# Patient Record
Sex: Male | Born: 1955
Health system: Southern US, Community
[De-identification: ages and names within clinical notes are randomized; demographics above are authoritative.]

## PROBLEM LIST (undated history)

## (undated) DIAGNOSIS — K635 Polyp of colon: Secondary | ICD-10-CM

## (undated) DIAGNOSIS — F32A Depression, unspecified: Secondary | ICD-10-CM

## (undated) DIAGNOSIS — K219 Gastro-esophageal reflux disease without esophagitis: Secondary | ICD-10-CM

## (undated) DIAGNOSIS — H269 Unspecified cataract: Secondary | ICD-10-CM

## (undated) DIAGNOSIS — N189 Chronic kidney disease, unspecified: Secondary | ICD-10-CM

## (undated) DIAGNOSIS — F329 Major depressive disorder, single episode, unspecified: Secondary | ICD-10-CM

## (undated) DIAGNOSIS — T7840XA Allergy, unspecified, initial encounter: Secondary | ICD-10-CM

## (undated) DIAGNOSIS — M1990 Unspecified osteoarthritis, unspecified site: Secondary | ICD-10-CM

## (undated) DIAGNOSIS — C801 Malignant (primary) neoplasm, unspecified: Secondary | ICD-10-CM

## (undated) DIAGNOSIS — I493 Ventricular premature depolarization: Secondary | ICD-10-CM

## (undated) DIAGNOSIS — E785 Hyperlipidemia, unspecified: Secondary | ICD-10-CM

## (undated) DIAGNOSIS — F419 Anxiety disorder, unspecified: Secondary | ICD-10-CM

## (undated) HISTORY — DX: Unspecified cataract: H26.9

## (undated) HISTORY — PX: JOINT REPLACEMENT: SHX530

## (undated) HISTORY — DX: Allergy, unspecified, initial encounter: T78.40XA

## (undated) HISTORY — DX: Chronic kidney disease, unspecified: N18.9

## (undated) HISTORY — DX: Malignant (primary) neoplasm, unspecified: C80.1

## (undated) HISTORY — PX: EYE SURGERY: SHX253

## (undated) HISTORY — DX: Gastro-esophageal reflux disease without esophagitis: K21.9

## (undated) HISTORY — DX: Major depressive disorder, single episode, unspecified: F32.9

## (undated) HISTORY — PX: OTHER SURGICAL HISTORY: SHX169

## (undated) HISTORY — DX: Polyp of colon: K63.5

## (undated) HISTORY — DX: Unspecified osteoarthritis, unspecified site: M19.90

## (undated) HISTORY — DX: Depression, unspecified: F32.A

## (undated) HISTORY — DX: Ventricular premature depolarization: I49.3

## (undated) HISTORY — DX: Hyperlipidemia, unspecified: E78.5

## (undated) HISTORY — PX: POLYPECTOMY: SHX149

## (undated) HISTORY — DX: Anxiety disorder, unspecified: F41.9

## (undated) HISTORY — PX: CATARACT EXTRACTION: SUR2

---

## 2001-12-29 DIAGNOSIS — C801 Malignant (primary) neoplasm, unspecified: Secondary | ICD-10-CM

## 2001-12-29 HISTORY — DX: Malignant (primary) neoplasm, unspecified: C80.1

## 2001-12-29 HISTORY — PX: NEPHRECTOMY: SHX65

## 2006-12-29 LAB — HM COLONOSCOPY

## 2007-01-13 ENCOUNTER — Ambulatory Visit: Payer: Self-pay | Admitting: Gastroenterology

## 2008-11-02 ENCOUNTER — Emergency Department: Payer: Self-pay | Admitting: Emergency Medicine

## 2010-12-29 HISTORY — PX: RETINAL DETACHMENT SURGERY: SHX105

## 2011-06-17 ENCOUNTER — Telehealth: Payer: Self-pay | Admitting: Internal Medicine

## 2011-06-23 ENCOUNTER — Ambulatory Visit: Payer: Self-pay | Admitting: Internal Medicine

## 2011-07-01 ENCOUNTER — Ambulatory Visit (INDEPENDENT_AMBULATORY_CARE_PROVIDER_SITE_OTHER): Payer: Commercial Managed Care - PPO | Admitting: Internal Medicine

## 2011-07-01 ENCOUNTER — Encounter: Payer: Self-pay | Admitting: Internal Medicine

## 2011-07-01 VITALS — BP 142/90 | HR 80 | Temp 98.0°F | Resp 16 | Ht 67.0 in | Wt 265.0 lb

## 2011-07-01 DIAGNOSIS — Z0000 Encounter for general adult medical examination without abnormal findings: Secondary | ICD-10-CM

## 2011-07-01 MED ORDER — ALPRAZOLAM 0.5 MG PO TABS: 0.5000 mg | ORAL_TABLET | Freq: Every day | ORAL | Status: DC | PRN

## 2011-07-01 NOTE — Patient Instructions (Signed)
Limit your sodium (Salt) intake    It is important that you exercise regularly, at least 20 minutes 3 to 4 times per week.  If you develop chest pain or shortness of breath seek  medical attention.  You need to lose weight.  Consider a lower calorie diet and regular exercise.  Avoids foods high in acid such as tomatoes citrus juices, and spicy foods.  Avoid eating within two hours of lying down or before exercising.  Do not overheat.  Try smaller more frequent meals.  If symptoms persist, elevate the head of her bed 12 inches while sleeping.  Return in 6 months for follow-up  Please check your blood pressure on a regular basis.  If it is consistently greater than 150/90, please make an office appointment.  

## 2011-07-01 NOTE — Progress Notes (Signed)
Subjective:    Patient ID: Alan Tyler, male    DOB: 01/02/1956, 55 y.o.   MRN: [id number]  HPI   A 55 year old patient who is seen today to establish with our practice. He is a former Engineer, production medicine patient. In 2003 he was diagnosed with right to the renal cancer and is status post a right nephrectomy. He discontinued tobacco use at that time. He has been followed this year by Dr. Luciana Axe it to a detached retina involving the right eye. He also has a cataract involving the right eye and is contemplating surgery. He has had some borderline blood pressure and lipid readings in the past. He has remote history of depression that has been stable off medication he has gastroesophageal reflux disease well controlled on OTC omeprazole. He has renal stone disease and also had a history of colonic polyps. Surgical procedures have included a right nephrectomy in 2003 and repair of a detached retina  Family history father died at age 83 of an MI. First MI at age 11. Mother age 37 with hypothyroidism. He is the oldest of 5 siblings Social history. 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  HENT: Negative for hearing loss, ear pain, congestion, rhinorrhea, sneezing, mouth sores, trouble swallowing, neck pain, neck stiffness, dental problem, voice change, sinus pressure and tinnitus.   Eyes: Negative for photophobia, pain, redness and visual disturbance.  Respiratory: Negative for apnea, cough, choking, chest tightness, shortness of breath and wheezing.   Cardiovascular: Negative for chest pain, palpitations and leg swelling.  Gastrointestinal: Negative for nausea, vomiting, abdominal pain, diarrhea, constipation, blood in stool, abdominal distention,  anal bleeding and rectal pain.  Genitourinary: Negative for dysuria, urgency, frequency, hematuria, flank pain, decreased urine volume, discharge, penile swelling, scrotal swelling, difficulty urinating, genital sores and testicular pain.  Musculoskeletal: Negative for myalgias, back pain, joint swelling, arthralgias and gait problem.  Skin: Negative for color change, rash and wound.  Neurological: Negative for dizziness, tremors, seizures, syncope, facial asymmetry, speech difficulty, weakness, light-headedness, numbness and headaches.  Hematological: Negative for adenopathy. Does not bruise/bleed easily.  Psychiatric/Behavioral: Negative for suicidal ideas, hallucinations, behavioral problems, confusion, sleep disturbance, self-injury, dysphoric mood, decreased concentration and agitation. The patient is not nervous/anxious.        Objective:   Physical Exam  Constitutional: He is oriented to person, place, and time. He appears well-developed.       Weight 265 Blood pressure 140 over 90  HENT:  Head: Normocephalic.  Right Ear: External ear normal.  Left Ear: External ear normal.       Pharyngeal crowding  Eyes: Conjunctivae and EOM are normal.  Neck: Normal range of motion.  Cardiovascular: Normal rate and normal heart sounds.   Pulmonary/Chest: Breath sounds normal.  Abdominal: Bowel sounds are normal.  Musculoskeletal: Normal range of motion. He exhibits no edema and no tenderness.  Neurological: He is alert and oriented to person, place, and time.  Skin:       Subcutaneous mass left supraclavicular area which has been chronic and thought to be a sebaceous cyst. This has been evaluated by dermatology  Psychiatric: He has a normal mood and affect. His behavior is normal.          Assessment & Plan:   Exogenous obesity  Borderline hypertension History of mild dyslipidemia Rule out obstructive sleep apnea Colonic polyps History of right  nephrectomy Nephrolithiasis Gastroesophageal reflux disease Remote history depression  Lifestyle issues discussed at length. He will return at his convenience for a fasting lab including testosterone level

## 2011-07-03 ENCOUNTER — Other Ambulatory Visit (INDEPENDENT_AMBULATORY_CARE_PROVIDER_SITE_OTHER): Payer: Commercial Managed Care - PPO

## 2011-07-03 DIAGNOSIS — Z0000 Encounter for general adult medical examination without abnormal findings: Secondary | ICD-10-CM

## 2011-07-03 LAB — HEPATIC FUNCTION PANEL
ALT: 42 U/L (ref 0–53)
AST: 27 U/L (ref 0–37)
Albumin: 4.1 g/dL (ref 3.5–5.2)
Alkaline Phosphatase: 79 U/L (ref 39–117)
Bilirubin, Direct: 0.1 mg/dL (ref 0.0–0.3)
Total Bilirubin: 0.7 mg/dL (ref 0.3–1.2)
Total Protein: 6.9 g/dL (ref 6.0–8.3)

## 2011-07-03 LAB — POCT URINALYSIS DIPSTICK
Bilirubin, UA: NEGATIVE
Blood, UA: NEGATIVE
Glucose, UA: NEGATIVE
Ketones, UA: NEGATIVE
Leukocytes, UA: NEGATIVE
Nitrite, UA: NEGATIVE
Protein, UA: NEGATIVE
Spec Grav, UA: 1.01
Urobilinogen, UA: 0.2
pH, UA: 5.5

## 2011-07-03 LAB — CBC WITH DIFFERENTIAL/PLATELET
Basophils Absolute: 0 10*3/uL (ref 0.0–0.1)
Basophils Relative: 0.4 % (ref 0.0–3.0)
Eosinophils Absolute: 0.2 10*3/uL (ref 0.0–0.7)
Eosinophils Relative: 2.7 % (ref 0.0–5.0)
HCT: 44.2 % (ref 39.0–52.0)
Hemoglobin: 15.5 g/dL (ref 13.0–17.0)
Lymphocytes Relative: 26.7 % (ref 12.0–46.0)
Lymphs Abs: 1.9 10*3/uL (ref 0.7–4.0)
MCHC: 35.2 g/dL (ref 30.0–36.0)
MCV: 90.6 fl (ref 78.0–100.0)
Monocytes Absolute: 0.5 10*3/uL (ref 0.1–1.0)
Monocytes Relative: 7.5 % (ref 3.0–12.0)
Neutro Abs: 4.5 10*3/uL (ref 1.4–7.7)
Neutrophils Relative %: 62.7 % (ref 43.0–77.0)
Platelets: 190 10*3/uL (ref 150.0–400.0)
RBC: 4.88 Mil/uL (ref 4.22–5.81)
RDW: 14.2 % (ref 11.5–14.6)
WBC: 7.1 10*3/uL (ref 4.5–10.5)

## 2011-07-03 LAB — LIPID PANEL
Cholesterol: 209 mg/dL — ABNORMAL HIGH (ref 0–200)
HDL: 34.9 mg/dL — ABNORMAL LOW (ref >39.00)
Total CHOL/HDL Ratio: 6
Triglycerides: 215 mg/dL — ABNORMAL HIGH (ref 0.0–149.0)
VLDL: 43 mg/dL — ABNORMAL HIGH (ref 0.0–40.0)

## 2011-07-03 LAB — BASIC METABOLIC PANEL
BUN: 13 mg/dL (ref 6–23)
CO2: 25 mEq/L (ref 19–32)
Calcium: 8.7 mg/dL (ref 8.4–10.5)
Chloride: 105 mEq/L (ref 96–112)
Creatinine, Ser: 1.3 mg/dL (ref 0.4–1.5)
GFR: 63.28 mL/min (ref >60.00)
Glucose, Bld: 117 mg/dL — ABNORMAL HIGH (ref 70–99)
Potassium: 4.7 mEq/L (ref 3.5–5.1)
Sodium: 136 mEq/L (ref 135–145)

## 2011-07-03 LAB — TSH: TSH: 2.34 u[IU]/mL (ref 0.35–5.50)

## 2011-07-03 LAB — LDL CHOLESTEROL, DIRECT: Direct LDL: 162.6 mg/dL

## 2011-07-03 LAB — TESTOSTERONE: Testosterone: 465.86 ng/dL (ref 350.00–890.00)

## 2011-07-03 LAB — PSA: PSA: 0.97 ng/mL (ref 0.10–4.00)

## 2011-07-08 ENCOUNTER — Telehealth: Payer: Self-pay | Admitting: Internal Medicine

## 2011-07-08 NOTE — Telephone Encounter (Signed)
Please advise 

## 2011-07-08 NOTE — Telephone Encounter (Signed)
Spoke with pt - informed of lab

## 2011-07-08 NOTE — Telephone Encounter (Signed)
Requesting lab results from last Thursday.

## 2011-07-08 NOTE — Telephone Encounter (Signed)
All okay. Cholesterol 209;  testosterone level normal

## 2011-08-19 NOTE — Telephone Encounter (Signed)
In error

## 2011-11-10 ENCOUNTER — Telehealth: Payer: Self-pay | Admitting: *Deleted

## 2011-11-10 MED ORDER — BUPROPION HCL ER (XL) 150 MG PO TB24: 150.0000 mg | ORAL_TABLET | ORAL | Status: DC

## 2011-11-10 NOTE — Telephone Encounter (Signed)
Wellbutrin extended release 150 mg #90 one daily. Suggest office visit in 6 weeks

## 2011-11-10 NOTE — Telephone Encounter (Signed)
Attempt to call - VM- LMTCB if questions - ok for new rx - sent to [facility] - need to call and make 6wk rov

## 2011-11-10 NOTE — Telephone Encounter (Signed)
Stress and depression and Dr. Kirtland Bouchard gave him Wellbutrin a few year ago.  Would like to try it again.  Is also taking Xanax.

## 2011-12-08 ENCOUNTER — Ambulatory Visit (INDEPENDENT_AMBULATORY_CARE_PROVIDER_SITE_OTHER): Payer: Commercial Managed Care - PPO | Admitting: Internal Medicine

## 2011-12-08 ENCOUNTER — Ambulatory Visit: Payer: Commercial Managed Care - PPO | Admitting: Internal Medicine

## 2011-12-08 ENCOUNTER — Encounter: Payer: Self-pay | Admitting: Internal Medicine

## 2011-12-08 VITALS — BP 112/80 | Temp 98.4°F | Wt 260.0 lb

## 2011-12-08 DIAGNOSIS — Z23 Encounter for immunization: Secondary | ICD-10-CM

## 2011-12-08 DIAGNOSIS — Z0000 Encounter for general adult medical examination without abnormal findings: Secondary | ICD-10-CM

## 2011-12-08 MED ORDER — ALPRAZOLAM 0.5 MG PO TABS: 0.5000 mg | ORAL_TABLET | Freq: Every day | ORAL | Status: DC | PRN

## 2011-12-08 MED ORDER — BUPROPION HCL ER (XL) 150 MG PO TB24: 150.0000 mg | ORAL_TABLET | ORAL | Status: DC

## 2011-12-08 NOTE — Patient Instructions (Signed)
It is important that you exercise regularly, at least 20 minutes 3 to 4 times per week.  If you develop chest pain or shortness of breath seek  medical attention.  Limit your sodium (Salt) intake  Return in 6 months for follow-up  

## 2011-12-08 NOTE — Progress Notes (Signed)
  Subjective:    Patient ID: Alan Tyler, male    DOB: 06-02-1956, 55 y.o.   MRN: [id number]  HPI  55 year old patient who is seen today for followup. He was seen for his annual exam this past summer and was doing well 6 months ago. He has a history of depression and motivation and was resumed one month ago and he has had a very nice clinical response. He feels very well today he also takes alprazolam when necessary but often goes days without use. He quite pleased with his progress today    Review of Systems  Psychiatric/Behavioral: Positive for dysphoric mood. The patient is nervous/anxious.        Objective:   Physical Exam  Constitutional: He is oriented to person, place, and time. He appears well-developed and well-nourished. No distress.       Blood pressure well controlled  HENT:  Head: Normocephalic.  Right Ear: External ear normal.  Left Ear: External ear normal.  Eyes: Conjunctivae and EOM are normal.  Neck: Normal range of motion.  Cardiovascular: Normal rate and normal heart sounds.   Pulmonary/Chest: Breath sounds normal.  Abdominal: Bowel sounds are normal.  Musculoskeletal: Normal range of motion. He exhibits no edema and no tenderness.  Neurological: He is alert and oriented to person, place, and time.  Psychiatric: He has a normal mood and affect. His behavior is normal. Judgment and thought content normal.          Assessment & Plan:   Depression. Not clinical response with modest dose Wellbutrin. We'll continue present regimen Will return here when necessary or in 6 months for his annual exam

## 2012-06-07 ENCOUNTER — Ambulatory Visit (INDEPENDENT_AMBULATORY_CARE_PROVIDER_SITE_OTHER): Payer: Commercial Managed Care - PPO | Admitting: Internal Medicine

## 2012-06-07 ENCOUNTER — Encounter: Payer: Self-pay | Admitting: Internal Medicine

## 2012-06-07 VITALS — BP 134/78 | Temp 98.3°F | Wt 266.0 lb

## 2012-06-07 DIAGNOSIS — F329 Major depressive disorder, single episode, unspecified: Secondary | ICD-10-CM

## 2012-06-07 DIAGNOSIS — F32A Depression, unspecified: Secondary | ICD-10-CM

## 2012-06-07 MED ORDER — ALPRAZOLAM 0.5 MG PO TABS: 0.5000 mg | ORAL_TABLET | Freq: Every day | ORAL | Status: DC | PRN

## 2012-06-07 MED ORDER — BUPROPION HCL ER (XL) 150 MG PO TB24: 150.0000 mg | ORAL_TABLET | ORAL | Status: DC

## 2012-06-07 NOTE — Progress Notes (Signed)
  Subjective:    Patient ID: Alan Tyler, male    DOB: Nov 17, 1956, 56 y.o.   MRN: [id number]  HPI  Wt Readings from Last 3 Encounters:  06/07/12 266 lb (120.657 kg)  12/08/11 260 lb (117.935 kg)  07/01/11 265 lb (120.203 kg)    Review of Systems     Objective:   Physical Exam        Assessment & Plan:

## 2012-06-07 NOTE — Progress Notes (Signed)
  Subjective:    Patient ID: Alan Tyler, male    DOB: 1956-05-18, 56 y.o.   MRN: [id number]  HPI  56 year old patient who is seen today for followup. He has been on low-dose Wellbutrin for anxiety depression and has done quite well on this dose. He also uses alprazolam 2 or 3 times per week and feels that he has done quite well. He is quite pleased with his progress. He still is out of work and has some other stressors but seems to be managing quite well    Review of Systems  Constitutional: Negative for fever, chills, appetite change and fatigue.  HENT: Negative for hearing loss, ear pain, congestion, sore throat, trouble swallowing, neck stiffness, dental problem, voice change and tinnitus.   Eyes: Negative for pain, discharge and visual disturbance.  Respiratory: Negative for cough, chest tightness, wheezing and stridor.   Cardiovascular: Negative for chest pain, palpitations and leg swelling.  Gastrointestinal: Negative for nausea, vomiting, abdominal pain, diarrhea, constipation, blood in stool and abdominal distention.  Genitourinary: Negative for urgency, hematuria, flank pain, discharge, difficulty urinating and genital sores.  Musculoskeletal: Negative for myalgias, back pain, joint swelling, arthralgias and gait problem.  Skin: Negative for rash.  Neurological: Negative for dizziness, syncope, speech difficulty, weakness, numbness and headaches.  Hematological: Negative for adenopathy. Does not bruise/bleed easily.  Psychiatric/Behavioral: Positive for dysphoric mood. Negative for behavioral problems. The patient is nervous/anxious.        Objective:   Physical Exam  Constitutional: He appears well-developed and well-nourished. No distress.       Weight 266 Blood pressure 110/76  Psychiatric: He has a normal mood and affect. His behavior is normal. Judgment and thought content normal.          Assessment & Plan:    Anxiety depression. Stable we'll continue present  regimen. Medicines refilled we'll see in 6 months for his annual exam

## 2012-06-07 NOTE — Patient Instructions (Signed)
Limit your sodium (Salt) intake    It is important that you exercise regularly, at least 20 minutes 3 to 4 times per week.  If you develop chest pain or shortness of breath seek  medical attention.  You need to lose weight.  Consider a lower calorie diet and regular exercise. 

## 2012-11-29 ENCOUNTER — Other Ambulatory Visit (INDEPENDENT_AMBULATORY_CARE_PROVIDER_SITE_OTHER): Payer: Commercial Managed Care - PPO

## 2012-11-29 DIAGNOSIS — Z0000 Encounter for general adult medical examination without abnormal findings: Secondary | ICD-10-CM

## 2012-11-29 LAB — LIPID PANEL
Cholesterol: 222 mg/dL — ABNORMAL HIGH (ref 0–200)
HDL: 32.9 mg/dL — ABNORMAL LOW (ref >39.00)
Total CHOL/HDL Ratio: 7
Triglycerides: 213 mg/dL — ABNORMAL HIGH (ref 0.0–149.0)
VLDL: 42.6 mg/dL — ABNORMAL HIGH (ref 0.0–40.0)

## 2012-11-29 LAB — HEPATIC FUNCTION PANEL
ALT: 22 U/L (ref 0–53)
AST: 20 U/L (ref 0–37)
Albumin: 3.7 g/dL (ref 3.5–5.2)
Alkaline Phosphatase: 71 U/L (ref 39–117)
Bilirubin, Direct: 0.1 mg/dL (ref 0.0–0.3)
Total Bilirubin: 0.8 mg/dL (ref 0.3–1.2)
Total Protein: 7 g/dL (ref 6.0–8.3)

## 2012-11-29 LAB — CBC WITH DIFFERENTIAL/PLATELET
Basophils Absolute: 0 10*3/uL (ref 0.0–0.1)
Basophils Relative: 0.6 % (ref 0.0–3.0)
Eosinophils Absolute: 0.2 10*3/uL (ref 0.0–0.7)
Eosinophils Relative: 2.8 % (ref 0.0–5.0)
HCT: 42 % (ref 39.0–52.0)
Hemoglobin: 13.9 g/dL (ref 13.0–17.0)
Lymphocytes Relative: 23.6 % (ref 12.0–46.0)
Lymphs Abs: 1.7 10*3/uL (ref 0.7–4.0)
MCHC: 33 g/dL (ref 30.0–36.0)
MCV: 88.7 fl (ref 78.0–100.0)
Monocytes Absolute: 0.5 10*3/uL (ref 0.1–1.0)
Monocytes Relative: 6.2 % (ref 3.0–12.0)
Neutro Abs: 4.9 10*3/uL (ref 1.4–7.7)
Neutrophils Relative %: 66.8 % (ref 43.0–77.0)
Platelets: 219 10*3/uL (ref 150.0–400.0)
RBC: 4.73 Mil/uL (ref 4.22–5.81)
RDW: 14.5 % (ref 11.5–14.6)
WBC: 7.4 10*3/uL (ref 4.5–10.5)

## 2012-11-29 LAB — BASIC METABOLIC PANEL
BUN: 14 mg/dL (ref 6–23)
CO2: 23 mEq/L (ref 19–32)
Calcium: 8.7 mg/dL (ref 8.4–10.5)
Chloride: 106 mEq/L (ref 96–112)
Creatinine, Ser: 1.5 mg/dL (ref 0.4–1.5)
GFR: 52.7 mL/min — ABNORMAL LOW (ref >60.00)
Glucose, Bld: 104 mg/dL — ABNORMAL HIGH (ref 70–99)
Potassium: 4.4 mEq/L (ref 3.5–5.1)
Sodium: 138 mEq/L (ref 135–145)

## 2012-11-29 LAB — POCT URINALYSIS DIPSTICK
Bilirubin, UA: NEGATIVE
Blood, UA: NEGATIVE
Glucose, UA: NEGATIVE
Ketones, UA: NEGATIVE
Leukocytes, UA: NEGATIVE
Nitrite, UA: NEGATIVE
Protein, UA: NEGATIVE
Spec Grav, UA: 1.015
Urobilinogen, UA: 0.2
pH, UA: 5.5

## 2012-11-29 LAB — TSH: TSH: 5.38 u[IU]/mL (ref 0.35–5.50)

## 2012-11-29 LAB — LDL CHOLESTEROL, DIRECT: Direct LDL: 163.2 mg/dL

## 2012-11-29 LAB — PSA: PSA: 1.43 ng/mL (ref 0.10–4.00)

## 2012-12-07 ENCOUNTER — Ambulatory Visit (INDEPENDENT_AMBULATORY_CARE_PROVIDER_SITE_OTHER): Payer: Commercial Managed Care - PPO | Admitting: Internal Medicine

## 2012-12-07 ENCOUNTER — Encounter: Payer: Self-pay | Admitting: Internal Medicine

## 2012-12-07 VITALS — BP 122/80 | HR 106 | Temp 98.1°F | Resp 20 | Ht 67.0 in | Wt 260.0 lb

## 2012-12-07 DIAGNOSIS — Z23 Encounter for immunization: Secondary | ICD-10-CM

## 2012-12-07 DIAGNOSIS — Z0000 Encounter for general adult medical examination without abnormal findings: Secondary | ICD-10-CM

## 2012-12-07 DIAGNOSIS — F329 Major depressive disorder, single episode, unspecified: Secondary | ICD-10-CM

## 2012-12-07 DIAGNOSIS — Z86010 Personal history of colonic polyps: Secondary | ICD-10-CM

## 2012-12-07 DIAGNOSIS — F32A Depression, unspecified: Secondary | ICD-10-CM

## 2012-12-07 MED ORDER — ALPRAZOLAM 0.5 MG PO TABS: 0.5000 mg | ORAL_TABLET | Freq: Every day | ORAL | Status: DC | PRN

## 2012-12-07 MED ORDER — OMEPRAZOLE 20 MG PO CPDR: 20.0000 mg | DELAYED_RELEASE_CAPSULE | Freq: Every day | ORAL | Status: DC

## 2012-12-07 MED ORDER — BUPROPION HCL ER (XL) 150 MG PO TB24: 150.0000 mg | ORAL_TABLET | ORAL | Status: DC

## 2012-12-07 NOTE — Patient Instructions (Addendum)
Schedule your colonoscopy to help detect colon cancer.    It is important that you exercise regularly, at least 20 minutes 3 to 4 times per week.  If you develop chest pain or shortness of breath seek  medical attention.  You need to lose weight.  Consider a lower calorie diet and regular exercise.

## 2012-12-07 NOTE — Progress Notes (Signed)
Patient ID: Alan Tyler, male   DOB: [birth year], 56 y.o.   MRN: [id number]  Subjective:    Patient ID: Alan Tyler, male    DOB: [birth year], 56 y.o.   MRN: [id number]  HPI   56 year-old patient who is seen today for an annual exam. He is a former Engineer, production medicine patient. In 2003 he was diagnosed with right  renal cancer and is status post a right nephrectomy. He discontinued tobacco use at that time. He has been followed this year by Dr. Luciana Axe it to a detached retina involving the right eye. He also has a cataract involving the right eye and is contemplating surgery. He has had some borderline blood pressure and lipid readings in the past. He has remote history of depression that has been stable off medication he has gastroesophageal reflux disease well controlled on OTC omeprazole. He has renal stone disease and also had a history of colonic polyps. Surgical procedures have included a right nephrectomy in 2003 and repair of a detached retina  Family history father died at age 12 of an MI. First MI at age 90. Mother age 55 with hypothyroidism. He is the oldest of 5 siblings Social history. 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  HENT: Negative for hearing loss, ear pain, congestion, rhinorrhea, sneezing, mouth sores, trouble swallowing, neck pain, neck stiffness, dental problem, voice change, sinus pressure and tinnitus.   Eyes: Negative for photophobia, pain, redness and visual disturbance.  Respiratory: Negative for apnea, cough, choking, chest tightness, shortness of breath and wheezing.   Cardiovascular: Negative for chest pain, palpitations and leg swelling.  Gastrointestinal: Negative for nausea, vomiting, abdominal pain, diarrhea,  constipation, blood in stool, abdominal distention, anal bleeding and rectal pain.  Genitourinary: Negative for dysuria, urgency, frequency, hematuria, flank pain, decreased urine volume, discharge, penile swelling, scrotal swelling, difficulty urinating, genital sores and testicular pain.  Musculoskeletal: Negative for myalgias, back pain, joint swelling, arthralgias and gait problem.  Skin: Negative for color change, rash and wound.  Neurological: Negative for dizziness, tremors, seizures, syncope, facial asymmetry, speech difficulty, weakness, light-headedness, numbness and headaches.  Hematological: Negative for adenopathy. Does not bruise/bleed easily.  Psychiatric/Behavioral: Negative for suicidal ideas, hallucinations, behavioral problems, confusion, sleep disturbance, self-injury, dysphoric mood, decreased concentration and agitation. The patient is not nervous/anxious.        Objective:   Physical Exam  Constitutional: He is oriented to person, place, and time. He appears well-developed.       Weight 265 Blood pressure 140 over 90  HENT:  Head: Normocephalic.  Right Ear: External ear normal.  Left Ear: External ear normal.       Pharyngeal crowding  Eyes: Conjunctivae normal and EOM are normal.  Neck: Normal range of motion.  Cardiovascular: Normal rate and normal heart sounds.        Decreased left posterior tibial pulse  Pulmonary/Chest: Breath sounds normal.  Abdominal: Bowel sounds are normal.  Musculoskeletal: Normal range of motion. He exhibits no edema and no tenderness.  Neurological: He is alert and oriented to person, place, and time.  Skin:       Subcutaneous mass left supraclavicular area which has been chronic and thought to be a sebaceous cyst. This has been evaluated by dermatology  Psychiatric: He  has a normal mood and affect. His behavior is normal.          Assessment & Plan:   Exogenous obesity Borderline hypertension History of mild  dyslipidemia Rule out obstructive sleep apnea Colonic polyps History of right nephrectomy Nephrolithiasis Gastroesophageal reflux disease Remote history depression  Needs followup colonoscopy Weight loss exercise encouraged Recheck 1 year or as needed

## 2012-12-08 ENCOUNTER — Encounter: Payer: Self-pay | Admitting: Gastroenterology

## 2012-12-17 ENCOUNTER — Other Ambulatory Visit: Payer: Self-pay | Admitting: Internal Medicine

## 2012-12-27 ENCOUNTER — Encounter: Payer: Self-pay | Admitting: Gastroenterology

## 2012-12-27 ENCOUNTER — Ambulatory Visit (AMBULATORY_SURGERY_CENTER): Payer: Commercial Managed Care - PPO | Admitting: *Deleted

## 2012-12-27 VITALS — Ht 67.0 in | Wt 262.6 lb

## 2012-12-27 DIAGNOSIS — Z1211 Encounter for screening for malignant neoplasm of colon: Secondary | ICD-10-CM

## 2012-12-27 MED ORDER — MOVIPREP 100 G PO SOLR: ORAL | Status: DC

## 2012-12-29 HISTORY — PX: COLONOSCOPY: SHX174

## 2012-12-30 ENCOUNTER — Telehealth: Payer: Self-pay | Admitting: *Deleted

## 2012-12-30 NOTE — Telephone Encounter (Signed)
Talked with pt: he will try to get colonoscopy and pathology report and bring day of procedure

## 2012-12-30 NOTE — Telephone Encounter (Signed)
Dr. Russella Dar:  Pt is scheduled for colonoscopy Friday 1/10.  Last colonoscopy 5 years ago in [place] with Dr. Servando Snare who is no longer practicing.  Pt's PCP has colonoscopy report but their computer system could not print report.  Report was read to me over the phone: Pt had 6 mm polyp in sigmoid colon.  Pathology report was not available in the PCP office.  Pt did say that he was told that he had an adenomatous polyp and should have recall in 5 years.  Is pt okay for direct colonoscopy?  Thanks, Olegario Messier

## 2012-12-30 NOTE — Telephone Encounter (Signed)
OK for direct colonoscopy. Please ask the patient to get copies of the colonoscopy report and pathology report to [place] if at all possible.

## 2013-01-03 ENCOUNTER — Encounter: Payer: Commercial Managed Care - PPO | Admitting: Gastroenterology

## 2013-01-07 ENCOUNTER — Ambulatory Visit (AMBULATORY_SURGERY_CENTER): Payer: Commercial Managed Care - PPO | Admitting: Gastroenterology

## 2013-01-07 ENCOUNTER — Encounter: Payer: Self-pay | Admitting: Gastroenterology

## 2013-01-07 VITALS — BP 136/76 | HR 78 | Temp 97.5°F | Resp 21 | Ht 67.0 in | Wt 262.0 lb

## 2013-01-07 DIAGNOSIS — D126 Benign neoplasm of colon, unspecified: Secondary | ICD-10-CM

## 2013-01-07 DIAGNOSIS — Z860100 Personal history of colon polyps, unspecified: Secondary | ICD-10-CM

## 2013-01-07 DIAGNOSIS — Z1211 Encounter for screening for malignant neoplasm of colon: Secondary | ICD-10-CM

## 2013-01-07 MED ORDER — SODIUM CHLORIDE 0.9 % IV SOLN: 500.0000 mL | INTRAVENOUS | Status: DC

## 2013-01-07 NOTE — Progress Notes (Signed)
Patient did not experience any of the following events: a burn prior to discharge; a fall within the facility; wrong site/side/patient/procedure/implant event; or a hospital transfer or hospital admission upon discharge from the facility. (G8907) Patient did not have preoperative order for IV antibiotic SSI prophylaxis. (G8918)  

## 2013-01-07 NOTE — Op Note (Signed)
[facility] [address] [place], [address]   COLONOSCOPY PROCEDURE REPORT  PATIENT: Alan Tyler, Alan Tyler  MR#: [id number] BIRTHDATE: Mar 18, 1956 , 56  yrs. old GENDER: Male ENDOSCOPIST: Meryl Dare, MD, [facility] REFERRED GN:[id number] Amador Cunas, M.D. PROCEDURE DATE:  01/07/2013 PROCEDURE:   Colonoscopy with snare polypectomy ASA CLASS:   Class II INDICATIONS:Patient's personal history of adenomatous colon polyps, 12/2006. MEDICATIONS: MAC sedation, administered by CRNA and propofol (Diprivan) 350mg  IV DESCRIPTION OF PROCEDURE:   After the risks benefits and alternatives of the procedure were thoroughly explained, informed consent was obtained.  A digital rectal exam revealed no abnormalities of the rectum.   The LB [id number]  endoscope was introduced through the anus and advanced to the cecum, which was identified by both the appendix and ileocecal valve. No adverse events experienced.   Limited by a tortuous colon.   The quality of the prep was good, using MoviPrep  The instrument was then slowly withdrawn as the colon was fully examined.  COLON FINDINGS: Two sessile polyps measuring 6 mm in size were found in the ascending colon and transverse colon.  A polypectomy was performed with a cold snare.  The resection was complete and the polyp tissue was completely retrieved.   The colon was otherwise normal.  There was no diverticulosis, inflammation, polyps or cancers unless previously stated.  Retroflexed views revealed small internal hemorrhoids. The time to cecum=7 minutes 38 seconds. Withdrawal time=10 minutes 19 seconds.  The scope was withdrawn and the procedure completed.  COMPLICATIONS: There were no complications.  ENDOSCOPIC IMPRESSION: 1.   Two sessile polyps measuring 6 mm in the ascending and transverse colon; polypectomy performed with a cold snare 2.   Small internal hemorrhoids  RECOMMENDATIONS: 1.  Await pathology results 2.  Repeat  Colonoscopy in 5 years.  eSigned:  Meryl Dare, MD, [facility] 01/07/2013 11:49 AM

## 2013-01-07 NOTE — Patient Instructions (Addendum)
YOU HAD AN ENDOSCOPIC PROCEDURE TODAY AT THE [facility]: Refer to the procedure report that was given to you for any specific questions about what was found during the examination.  If the procedure report does not answer your questions, please call your gastroenterologist to clarify.  If you requested that your care partner not be given the details of your procedure findings, then the procedure report has been included in a sealed envelope for you to review at your convenience later.  YOU SHOULD EXPECT: Some feelings of bloating in the abdomen. Passage of more gas than usual.  Walking can help get rid of the air that was put into your GI tract during the procedure and reduce the bloating. If you had a lower endoscopy (such as a colonoscopy or flexible sigmoidoscopy) you may notice spotting of blood in your stool or on the toilet paper. If you underwent a bowel prep for your procedure, then you may not have a normal bowel movement for a few days.  DIET: Your first meal following the procedure should be a light meal and then it is ok to progress to your normal diet.  A half-sandwich or bowl of soup is an example of a good first meal.  Heavy or fried foods are harder to digest and may make you feel nauseous or bloated.  Likewise meals heavy in dairy and vegetables can cause extra gas to form and this can also increase the bloating.  Drink plenty of fluids but you should avoid alcoholic beverages for 24 hours.  ACTIVITY: Your care partner should take you home directly after the procedure.  You should plan to take it easy, moving slowly for the rest of the day.  You can resume normal activity the day after the procedure however you should NOT DRIVE or use heavy machinery for 24 hours (because of the sedation medicines used during the test).    SYMPTOMS TO REPORT IMMEDIATELY: A gastroenterologist can be reached at any hour.  During normal business hours, 8:30 AM to 5:00 PM Monday through Friday,  call [phone].  After hours and on weekends, please call the GI answering service at [phone] who will take a message and have the physician on call contact you.   Following lower endoscopy (colonoscopy or flexible sigmoidoscopy):  Excessive amounts of blood in the stool  Significant tenderness or worsening of abdominal pains  Swelling of the abdomen that is new, acute  Fever of 100F or higher  FOLLOW UP: If any biopsies were taken you will be contacted by phone or by letter within the next 1-3 weeks.  Call your gastroenterologist if you have not heard about the biopsies in 3 weeks.  Our staff will call the home number listed on your records the next business day following your procedure to check on you and address any questions or concerns that you may have at that time regarding the information given to you following your procedure. This is a courtesy call and so if there is no answer at the home number and we have not heard from you through the emergency physician on call, we will assume that you have returned to your regular daily activities without incident.  SIGNATURES/CONFIDENTIALITY: You and/or your care partner have signed paperwork which will be entered into your electronic medical record.  These signatures attest to the fact that that the information above on your After Visit Summary has been reviewed and is understood.  Full responsibility of the confidentiality of this   discharge information lies with you and/or your care-partner.   Hemorrhoids Hemorrhoids are enlarged (dilated) veins around the rectum. There are 2 types of hemorrhoids, and the type of hemorrhoid is determined by its location. Internal hemorrhoids occur in the veins just inside the rectum.They are usually not painful, but they may bleed.However, they may poke through to the outside and become irritated and painful. External hemorrhoids involve the veins outside the anus and can be felt as a painful  swelling or hard lump near the anus.They are often itchy and may crack and bleed. Sometimes clots will form in the veins. This makes them swollen and painful. These are called thrombosed hemorrhoids. CAUSES Causes of hemorrhoids include:  Pregnancy. This increases the pressure in the hemorrhoidal veins.  Constipation.  Straining to have a bowel movement.  Obesity.  Heavy lifting or other activity that caused you to strain. TREATMENT Most of the time hemorrhoids improve in 1 to 2 weeks. However, if symptoms do not seem to be getting better or if you have a lot of rectal bleeding, your caregiver may perform a procedure to help make the hemorrhoids get smaller or remove them completely.Possible treatments include:  Rubber band ligation. A rubber band is placed at the base of the hemorrhoid to cut off the circulation.  Sclerotherapy. A chemical is injected to shrink the hemorrhoid.  Infrared light therapy. Tools are used to burn the hemorrhoid.  Hemorrhoidectomy. This is surgical removal of the hemorrhoid. HOME CARE INSTRUCTIONS   Increase fiber in your diet. Ask your caregiver about using fiber supplements.  Drink enough water and fluids to keep your urine clear or pale yellow.  Exercise regularly.  Go to the bathroom when you have the urge to have a bowel movement. Do not wait.  Avoid straining to have bowel movements.  Keep the anal area dry and clean.  Only take over-the-counter or prescription medicines for pain, discomfort, or fever as directed by your caregiver. If your hemorrhoids are thrombosed:  Take warm sitz baths for 20 to 30 minutes, 3 to 4 times per day.  If the hemorrhoids are very tender and swollen, place ice packs on the area as tolerated. Using ice packs between sitz baths may be helpful. Fill a plastic bag with ice. Place a towel between the bag of ice and your skin.  Medicated creams and suppositories may be used or applied as directed.  Do not use a  donut-shaped pillow or sit on the toilet for long periods. This increases blood pooling and pain. SEEK MEDICAL CARE IF:   You have increasing pain and swelling that is not controlled with your medicine.  You have uncontrolled bleeding.  You have difficulty or you are unable to have a bowel movement.  You have pain or inflammation outside the area of the hemorrhoids.  You have chills or an oral temperature above 102 F (38.9 C). MAKE SURE YOU:   Understand these instructions.  Will watch your condition.  Will get help right away if you are not doing well or get worse. Document Released: 12/12/2000 Document Revised: 03/08/2012 Document Reviewed: 11/25/2010 [facility] Patient Information 2013 [place], [place].  Handout on polyps  Repeat colon in 5 years

## 2013-01-10 ENCOUNTER — Telehealth: Payer: Self-pay | Admitting: *Deleted

## 2013-01-10 NOTE — Telephone Encounter (Signed)
  Follow up Call-  Call back number 01/07/2013  Post procedure Call Back phone  # [phone] hm  Permission to leave phone message Yes     Patient questions:  Do you have a fever, pain , or abdominal swelling? no Pain Score  0 *  Have you tolerated food without any problems? yes  Have you been able to return to your normal activities? yes  Do you have any questions about your discharge instructions: Diet   no Medications  no Follow up visit  no  Do you have questions or concerns about your Care? no  Actions: * If pain score is 4 or above: No action needed, pain <4.

## 2013-01-11 ENCOUNTER — Encounter: Payer: Self-pay | Admitting: Gastroenterology

## 2013-01-18 ENCOUNTER — Ambulatory Visit (INDEPENDENT_AMBULATORY_CARE_PROVIDER_SITE_OTHER): Payer: Commercial Managed Care - PPO | Admitting: Internal Medicine

## 2013-01-18 ENCOUNTER — Encounter: Payer: Self-pay | Admitting: Internal Medicine

## 2013-01-18 VITALS — BP 122/90 | HR 80 | Temp 98.1°F | Wt 259.0 lb

## 2013-01-18 DIAGNOSIS — R002 Palpitations: Secondary | ICD-10-CM

## 2013-01-18 DIAGNOSIS — R9431 Abnormal electrocardiogram [ECG] [EKG]: Secondary | ICD-10-CM

## 2013-01-18 DIAGNOSIS — I499 Cardiac arrhythmia, unspecified: Secondary | ICD-10-CM

## 2013-01-18 DIAGNOSIS — Z905 Acquired absence of kidney: Secondary | ICD-10-CM

## 2013-01-18 DIAGNOSIS — Z85528 Personal history of other malignant neoplasm of kidney: Secondary | ICD-10-CM

## 2013-01-18 NOTE — Patient Instructions (Addendum)
Please follow up with  Dr. Amador Cunas within 2 weeks of cardiology consultation

## 2013-01-18 NOTE — Progress Notes (Signed)
Subjective:    Patient ID: Alan Tyler, male    DOB: 17-Sep-1956, 57 y.o.   MRN: [id number]  HPI  57 year old white male with history of kidney cancer status post right nephrectomy here for evaluation of palpitations. Patient routinely donates blood. He was at [facility] today when nursing staff noted a regular heartbeats. Patient was unaware and is asymptomatic. He denies any associated chest pain or dizziness. He recalls episode of irregular heart beat 3 years ago while he was in [place]. Patient reports no workup is completed. Patient drinks 2-4 cups of coffee per day. He denies any intake of over-the-counter cold preps or decongestants.  He does have strong family history of coronary artery disease. Father deceased in his early 69s secondary to myocardial infarction. He has had stress testing in the remote past.   Review of Systems Negative for chest pain, dizziness or shortness of breath.  He does not exercise regularly     Past Medical History  Diagnosis Date  . Arthritis   . Depression   . GERD (gastroesophageal reflux disease)   . Heart murmur     arrhythmia  . Hyperlipidemia   . Hypertension   . Chronic kidney disease     kidney stones and (R) nephrectomy  . Colon polyps   . Cancer     kidney - 2003    History   Social History  . Marital Status: Married    Spouse Name: N/A    Number of Children: N/A  . Years of Education: N/A   Occupational History  . Not on file.   Social History Main Topics  . Smoking status: Former Smoker    Quit date: 12/29/2001  . Smokeless tobacco: Former Neurosurgeon    Quit date: 12/27/2002  . Alcohol Use: Yes     Comment: rarely  . Drug Use: No  . Sexually Active: Not on file   Other Topics Concern  . Not on file   Social History Narrative  . No narrative on file    Past Surgical History  Procedure Date  . Nephrectomy 2003    (R) for cancer tumor  . Retinal detachment surgery 2012  . Cataract extraction     right eye      Family History  Problem Relation Age of Onset  . Arthritis Mother   . Depression Mother   . Colon cancer Mother     age 56 part of colon was removed  . Alcohol abuse Father   . Heart disease Father   . Hyperlipidemia Father   . Hyperlipidemia Maternal Grandmother   . Hyperlipidemia Maternal Grandfather   . Hyperlipidemia Paternal Grandmother   . Diabetes Paternal Grandmother   . Arthritis Paternal Grandfather   . Hyperlipidemia Paternal Grandfather   . Stomach cancer Neg Hx   . Esophageal cancer Neg Hx   . Rectal cancer Neg Hx     Allergies  Allergen Reactions  . Sulfa Drugs Cross Reactors     Per pt: unknown    Current Outpatient Prescriptions on File Prior to Visit  Medication Sig Dispense Refill  . ALPRAZolam (XANAX) 0.5 MG tablet Take 1 tablet (0.5 mg total) by mouth daily as needed.  60 tablet  3  . aspirin 81 MG tablet Take 81 mg by mouth daily.        Marland Kitchen buPROPion (WELLBUTRIN XL) 150 MG 24 hr tablet Take 1 tablet (150 mg total) by mouth every morning.  90 tablet  4  .  Chamomile Flower EXTR by Does not apply route as needed.        Marland Kitchen ibuprofen (ADVIL,MOTRIN) 200 MG tablet Take 200 mg by mouth every 6 (six) hours as needed.          BP 122/90  Pulse 80  Temp 98.1 F (36.7 C) (Oral)  Wt 259 lb (117.482 kg)  EKG shows normal sinus rhythm at 87 beats per minute with occasional ectopic ventricular beat. He has shortened PR interval at 110 ms.  Objective:   Physical Exam  Constitutional: He is oriented to person, place, and time.       Pleasant, overweight 57 y/o  HENT:  Head: Normocephalic and atraumatic.  Neck: Neck supple.       No carotid bruit  Cardiovascular: Normal rate and normal heart sounds.   No murmur heard.      Occasional ectopic beat No murmur with left hand grip or with Valsalva maneuver  Pulmonary/Chest: Effort normal and breath sounds normal. He has no wheezes.  Abdominal: He exhibits no mass. There is no tenderness.       Abdominal  obesity  Musculoskeletal: He exhibits no edema.  Lymphadenopathy:    He has no cervical adenopathy.  Neurological: He is alert and oriented to person, place, and time. No cranial nerve deficit.  Psychiatric: He has a normal mood and affect. His behavior is normal.          Assessment & Plan:

## 2013-01-18 NOTE — Assessment & Plan Note (Addendum)
57 year old white male with history of kidney cancer was trying to donate blood at [facility] when nursing staff noted irregular heartbeat. Patient unaware and asymptomatic. He has occasional PVCs on EKG. However patient has strong family history of coronary disease and has shortened PR interval.  He did not appear to have delta wave.  Check BMET, CBCD and TFTs.  Refer to cardiology for further evaluation of abnormal EKG and palpitations.

## 2013-01-19 LAB — BASIC METABOLIC PANEL
BUN: 16 mg/dL (ref 6–23)
CO2: 24 mEq/L (ref 19–32)
Calcium: 9.1 mg/dL (ref 8.4–10.5)
Chloride: 108 mEq/L (ref 96–112)
Creatinine, Ser: 1.5 mg/dL (ref 0.4–1.5)
GFR: 53.51 mL/min — ABNORMAL LOW (ref >60.00)
Glucose, Bld: 89 mg/dL (ref 70–99)
Potassium: 4.3 mEq/L (ref 3.5–5.1)
Sodium: 139 mEq/L (ref 135–145)

## 2013-01-19 LAB — CBC WITH DIFFERENTIAL/PLATELET
Basophils Absolute: 0.1 10*3/uL (ref 0.0–0.1)
Basophils Relative: 0.7 % (ref 0.0–3.0)
Eosinophils Absolute: 0.3 10*3/uL (ref 0.0–0.7)
Eosinophils Relative: 2.7 % (ref 0.0–5.0)
HCT: 44.3 % (ref 39.0–52.0)
Hemoglobin: 15 g/dL (ref 13.0–17.0)
Lymphocytes Relative: 23.6 % (ref 12.0–46.0)
Lymphs Abs: 2.4 10*3/uL (ref 0.7–4.0)
MCHC: 33.9 g/dL (ref 30.0–36.0)
MCV: 84.9 fl (ref 78.0–100.0)
Monocytes Absolute: 0.5 10*3/uL (ref 0.1–1.0)
Monocytes Relative: 4.5 % (ref 3.0–12.0)
Neutro Abs: 7 10*3/uL (ref 1.4–7.7)
Neutrophils Relative %: 68.5 % (ref 43.0–77.0)
Platelets: 229 10*3/uL (ref 150.0–400.0)
RBC: 5.21 Mil/uL (ref 4.22–5.81)
RDW: 13.6 % (ref 11.5–14.6)
WBC: 10.1 10*3/uL (ref 4.5–10.5)

## 2013-01-19 LAB — T4, FREE: Free T4: 0.8 ng/dL (ref 0.60–1.60)

## 2013-01-19 LAB — TSH: TSH: 2.76 u[IU]/mL (ref 0.35–5.50)

## 2013-01-25 ENCOUNTER — Telehealth: Payer: Self-pay | Admitting: Cardiology

## 2013-01-25 NOTE — Telephone Encounter (Signed)
ROI faxed to [facility] @ [phone] 01/25/13/km

## 2013-01-26 ENCOUNTER — Ambulatory Visit (INDEPENDENT_AMBULATORY_CARE_PROVIDER_SITE_OTHER): Payer: Commercial Managed Care - PPO | Admitting: Cardiology

## 2013-01-26 ENCOUNTER — Telehealth: Payer: Self-pay | Admitting: Cardiology

## 2013-01-26 ENCOUNTER — Encounter: Payer: Self-pay | Admitting: Cardiology

## 2013-01-26 VITALS — BP 122/80 | HR 84 | Ht 67.0 in | Wt 260.0 lb

## 2013-01-26 DIAGNOSIS — I493 Ventricular premature depolarization: Secondary | ICD-10-CM

## 2013-01-26 DIAGNOSIS — R9431 Abnormal electrocardiogram [ECG] [EKG]: Secondary | ICD-10-CM

## 2013-01-26 DIAGNOSIS — I4949 Other premature depolarization: Secondary | ICD-10-CM

## 2013-01-26 DIAGNOSIS — R0789 Other chest pain: Secondary | ICD-10-CM

## 2013-01-26 DIAGNOSIS — R002 Palpitations: Secondary | ICD-10-CM

## 2013-01-26 NOTE — Progress Notes (Signed)
Alan Tyler Date of Birth: 1956/01/19 Medical Record #[id number]  History of Present Illness: Alan Tyler is seen at the request of Dr. Artist Pais for evaluation of irregular heartbeat. He is a pleasant 57 year old white male who has a history of hyperlipidemia and a strong family history of early coronary disease. Recently he went to give blood at the [facility]. He was noted by the nursing staff to have irregular heart beats. Patient states he really wasn't aware of this other than perhaps feeling a little bit empty in his chest. He was seen by his primary care in ECG demonstrated occasional PVCs. Of note the patient had extensive cardiac evaluation in 2009 in [place]. This included an echocardiogram and stress Myoview studies which were normal. No PVCs were noted at that time. He quit smoking 10 years ago. He drinks little alcohol. He does not use decongestants. He does drink 3-4 caffeinated beverages daily.  Current Outpatient Prescriptions on File Prior to Visit  Medication Sig Dispense Refill  . ALPRAZolam (XANAX) 0.5 MG tablet Take 1 tablet (0.5 mg total) by mouth daily as needed.  60 tablet  3  . aspirin 81 MG tablet Take 81 mg by mouth daily.        Marland Kitchen buPROPion (WELLBUTRIN XL) 150 MG 24 hr tablet Take 1 tablet (150 mg total) by mouth every morning.  90 tablet  4  . ibuprofen (ADVIL,MOTRIN) 200 MG tablet Take 200 mg by mouth every 6 (six) hours as needed.          Allergies  Allergen Reactions  . Sulfa Drugs Cross Reactors     Per pt: unknown    Past Medical History  Diagnosis Date  . Arthritis   . Depression   . GERD (gastroesophageal reflux disease)   . PVC's (premature ventricular contractions)     arrhythmia  . Hyperlipidemia   . Chronic kidney disease     kidney stones and (R) nephrectomy  . Colon polyps   . Cancer     kidney - 2003    Past Surgical History  Procedure Date  . Nephrectomy 2003    (R) for cancer tumor  . Retinal detachment surgery 2012  .  Cataract extraction     right eye    History  Smoking status  . Former Smoker  . Quit date: 12/29/2001  Smokeless tobacco  . Former Neurosurgeon  . Quit date: 12/27/2002    History  Alcohol Use  . Yes    Comment: rarely    Family History  Problem Relation Age of Onset  . Arthritis Mother   . Depression Mother   . Colon cancer Mother     age 15 part of colon was removed  . Alcohol abuse Father   . Heart disease Father   . Hyperlipidemia Father   . Hyperlipidemia Maternal Grandmother   . Hyperlipidemia Maternal Grandfather   . Hyperlipidemia Paternal Grandmother   . Diabetes Paternal Grandmother   . Arthritis Paternal Grandfather   . Hyperlipidemia Paternal Grandfather   . Stomach cancer Neg Hx   . Esophageal cancer Neg Hx   . Rectal cancer Neg Hx     Review of Systems: The review of systems is positive for occasional chest discomfort. This is left precordial. He relates this more to indigestion. It is not exertional..  All other systems were reviewed and are negative.  Physical Exam: BP 122/80  Pulse 84  Ht 5\' 7"  (1.702 m)  Wt 260 lb (117.935 kg)  BMI 40.72 kg/m2 He is a pleasant, obese white male in no acute distress.The patient is alert and oriented x 3.  The mood and affect are normal.  The skin is warm and dry.  Color is normal.  The HEENT exam reveals that the sclera are nonicteric.  The mucous membranes are moist.  The carotids are 2+ without bruits.  There is no thyromegaly.  There is no JVD.  The lungs are clear.  The chest wall is non tender.  The heart exam reveals a regular rate with a normal S1 and S2.  There are no murmurs, gallops, or rubs.  The PMI is not displaced.   Abdominal exam reveals good bowel sounds.  There is no guarding or rebound.  There is no hepatosplenomegaly or tenderness.  There are no masses.  Exam of the legs reveal no clubbing, cyanosis, or edema.  The legs are without rashes.  The distal pulses are intact.  Cranial nerves II - XII are intact.   Motor and sensory functions are intact.  The gait is normal.  LABORATORY DATA: ECG was reviewed from 01/18/2013. This demonstrates normal sinus rhythm with occasional PVCs. It is otherwise normal. Lab Results  Component Value Date   WBC 10.1 01/18/2013   HGB 15.0 01/18/2013   HCT 44.3 01/18/2013   PLT 229.0 01/18/2013   GLUCOSE 89 01/18/2013   CHOL 222* 11/29/2012   TRIG 213.0* 11/29/2012   HDL 32.90* 11/29/2012   LDLDIRECT 163.2 11/29/2012   ALT 22 11/29/2012   AST 20 11/29/2012   NA 139 01/18/2013   K 4.3 01/18/2013   CL 108 01/18/2013   CREATININE 1.5 01/18/2013   BUN 16 01/18/2013   CO2 24 01/18/2013   TSH 2.76 01/18/2013   PSA 1.43 11/29/2012     Assessment / Plan: 1. PVCs. Patient has no significant symptoms. The question is whether these are a marker of underlying cardiac disease or are benign. I recommended he reduce his caffeine intake. I recommended a stress echo to further evaluate his cardiovascular risk especially given his history of hyperlipidemia and strong family history of early coronary disease. He does have some atypical chest pain. If his stress test is normal then I do not feel that any further treatment is needed for his PVCs and these would be benign.  2. Hyperlipidemia  3. Atypical chest pain.

## 2013-01-26 NOTE — Telephone Encounter (Signed)
Records Rec From [facility], Gave to [facility]  Pt has 3:30 appt today 01/26/13/KM

## 2013-01-26 NOTE — Patient Instructions (Signed)
We will schedule you for a stress Echo.  Reduce your caffeine intake.

## 2013-02-04 ENCOUNTER — Ambulatory Visit (HOSPITAL_COMMUNITY): Payer: Commercial Managed Care - PPO | Attending: Internal Medicine | Admitting: Radiology

## 2013-02-04 ENCOUNTER — Encounter: Payer: Self-pay | Admitting: Cardiology

## 2013-02-04 ENCOUNTER — Ambulatory Visit (HOSPITAL_BASED_OUTPATIENT_CLINIC_OR_DEPARTMENT_OTHER): Payer: Commercial Managed Care - PPO

## 2013-02-04 DIAGNOSIS — R0789 Other chest pain: Secondary | ICD-10-CM

## 2013-02-04 DIAGNOSIS — R0989 Other specified symptoms and signs involving the circulatory and respiratory systems: Secondary | ICD-10-CM

## 2013-02-04 DIAGNOSIS — R079 Chest pain, unspecified: Secondary | ICD-10-CM | POA: Insufficient documentation

## 2013-02-04 DIAGNOSIS — I493 Ventricular premature depolarization: Secondary | ICD-10-CM

## 2013-02-04 DIAGNOSIS — R9431 Abnormal electrocardiogram [ECG] [EKG]: Secondary | ICD-10-CM

## 2013-02-04 DIAGNOSIS — R072 Precordial pain: Secondary | ICD-10-CM

## 2013-02-04 DIAGNOSIS — R002 Palpitations: Secondary | ICD-10-CM

## 2013-02-04 NOTE — Progress Notes (Signed)
Stress Echocardiogram performed.  

## 2013-02-14 ENCOUNTER — Telehealth: Payer: Self-pay | Admitting: Cardiology

## 2013-02-14 NOTE — Telephone Encounter (Signed)
Spoke to patient stress echo results given.

## 2013-02-14 NOTE — Telephone Encounter (Signed)
New problem:  Test results.  

## 2013-06-23 ENCOUNTER — Other Ambulatory Visit: Payer: Self-pay | Admitting: Internal Medicine

## 2013-06-28 NOTE — Telephone Encounter (Signed)
Pt is out of med

## 2013-09-08 ENCOUNTER — Ambulatory Visit (INDEPENDENT_AMBULATORY_CARE_PROVIDER_SITE_OTHER): Payer: Commercial Managed Care - PPO | Admitting: Internal Medicine

## 2013-09-08 ENCOUNTER — Encounter: Payer: Self-pay | Admitting: Internal Medicine

## 2013-09-08 VITALS — BP 120/82 | HR 83 | Temp 98.1°F | Resp 20 | Wt 237.0 lb

## 2013-09-08 DIAGNOSIS — Z23 Encounter for immunization: Secondary | ICD-10-CM

## 2013-09-08 DIAGNOSIS — F32A Depression, unspecified: Secondary | ICD-10-CM

## 2013-09-08 DIAGNOSIS — Z905 Acquired absence of kidney: Secondary | ICD-10-CM

## 2013-09-08 DIAGNOSIS — F329 Major depressive disorder, single episode, unspecified: Secondary | ICD-10-CM

## 2013-09-08 DIAGNOSIS — R002 Palpitations: Secondary | ICD-10-CM

## 2013-09-08 MED ORDER — BUPROPION HCL ER (XL) 150 MG PO TB24: 150.0000 mg | ORAL_TABLET | ORAL | Status: DC

## 2013-09-08 MED ORDER — OMEPRAZOLE 20 MG PO CPDR: 20.0000 mg | DELAYED_RELEASE_CAPSULE | Freq: Every day | ORAL | Status: DC

## 2013-09-08 MED ORDER — ALPRAZOLAM 0.5 MG PO TABS: ORAL_TABLET | ORAL | Status: DC

## 2013-09-08 NOTE — Progress Notes (Signed)
Subjective:    Patient ID: Alan Tyler, male    DOB: August 05, 1956, 57 y.o.   MRN: [id number]  HPI  57 year old patient who is seen today for his biannual followup. He has a history depression which has been well-controlled on his present regimen. He was evaluated by cardiology in January do to a palpitations as well his other cardiovascular risk factors. He had a negative stress echocardiogram performed. In general doing quite well no other new concerns or complaints.  Past Medical History  Diagnosis Date  . Arthritis   . Depression   . GERD (gastroesophageal reflux disease)   . PVC's (premature ventricular contractions)     arrhythmia  . Hyperlipidemia   . Chronic kidney disease     kidney stones and (R) nephrectomy  . Colon polyps   . Cancer     kidney - 2003    History   Social History  . Marital Status: Married    Spouse Name: N/A    Number of Children: 3  . Years of Education: N/A   Occupational History  . manufacturing-unemployed    Social History Main Topics  . Smoking status: Former Smoker    Quit date: 12/29/2001  . Smokeless tobacco: Former Neurosurgeon    Quit date: 12/27/2002  . Alcohol Use: Yes     Comment: rarely  . Drug Use: No  . Sexual Activity: Not on file   Other Topics Concern  . Not on file   Social History Narrative  . No narrative on file    Past Surgical History  Procedure Laterality Date  . Nephrectomy  2003    (R) for cancer tumor  . Retinal detachment surgery  2012  . Cataract extraction      right eye    Family History  Problem Relation Age of Onset  . Arthritis Mother   . Depression Mother   . Colon cancer Mother     age 71 part of colon was removed  . Alcohol abuse Father   . Heart disease Father   . Hyperlipidemia Father   . Hyperlipidemia Maternal Grandmother   . Hyperlipidemia Maternal Grandfather   . Hyperlipidemia Paternal Grandmother   . Diabetes Paternal Grandmother   . Arthritis Paternal Grandfather   .  Hyperlipidemia Paternal Grandfather   . Stomach cancer Neg Hx   . Esophageal cancer Neg Hx   . Rectal cancer Neg Hx     Allergies  Allergen Reactions  . Sulfa Drugs Cross Reactors     Per pt: unknown    Current Outpatient Prescriptions on File Prior to Visit  Medication Sig Dispense Refill  . aspirin 81 MG tablet Take 81 mg by mouth daily.        Marland Kitchen ibuprofen (ADVIL,MOTRIN) 200 MG tablet Take 200 mg by mouth every 6 (six) hours as needed.        . Omega-3 Fatty Acids (OMEGA 3 PO) Take by mouth. Take 3,300mg  (2 capsules) daily      . OVER THE COUNTER MEDICATION NATURAL PSYLIUM HUSK FIBER 0.52 grams,(5Capsules) daily       No current facility-administered medications on file prior to visit.    BP 120/82  Pulse 83  Temp(Src) 98.1 F (36.7 C) (Oral)  Resp 20  Wt 237 lb (107.502 kg)  BMI 37.11 kg/m2  SpO2 98%       Review of Systems  Constitutional: Negative for fever, chills, appetite change and fatigue.  HENT: Negative for hearing loss, ear pain, congestion, sore  throat, trouble swallowing, neck stiffness, dental problem, voice change and tinnitus.   Eyes: Negative for pain, discharge and visual disturbance.  Respiratory: Negative for cough, chest tightness, wheezing and stridor.   Cardiovascular: Negative for chest pain, palpitations and leg swelling.  Gastrointestinal: Negative for nausea, vomiting, abdominal pain, diarrhea, constipation, blood in stool and abdominal distention.  Genitourinary: Negative for urgency, hematuria, flank pain, discharge, difficulty urinating and genital sores.  Musculoskeletal: Negative for myalgias, back pain, joint swelling, arthralgias and gait problem.  Skin: Negative for rash.  Neurological: Negative for dizziness, syncope, speech difficulty, weakness, numbness and headaches.  Hematological: Negative for adenopathy. Does not bruise/bleed easily.  Psychiatric/Behavioral: Negative for behavioral problems and dysphoric mood. The patient is  not nervous/anxious.        Objective:   Physical Exam  Constitutional: He is oriented to person, place, and time. He appears well-developed.  HENT:  Head: Normocephalic.  Right Ear: External ear normal.  Left Ear: External ear normal.  Eyes: Conjunctivae and EOM are normal.  Neck: Normal range of motion.  Cardiovascular: Normal rate and normal heart sounds.   Pulmonary/Chest: Breath sounds normal.  Abdominal: Bowel sounds are normal.  Musculoskeletal: Normal range of motion. He exhibits no edema and no tenderness.  Neurological: He is alert and oriented to person, place, and time.  Psychiatric: He has a normal mood and affect. His behavior is normal.          Assessment & Plan:   Depression stable. We'll continue present regimen History palpitations stable  CPX in 6-12 months

## 2013-09-08 NOTE — Patient Instructions (Addendum)
Limit your sodium (Salt) intake    It is important that you exercise regularly, at least 20 minutes 3 to 4 times per week.  If you develop chest pain or shortness of breath seek  medical attention.  Return in 6 months for follow-up  

## 2014-01-01 ENCOUNTER — Other Ambulatory Visit: Payer: Self-pay | Admitting: Internal Medicine

## 2014-03-01 ENCOUNTER — Encounter: Payer: Self-pay | Admitting: *Deleted

## 2014-03-02 ENCOUNTER — Other Ambulatory Visit (INDEPENDENT_AMBULATORY_CARE_PROVIDER_SITE_OTHER): Payer: BC Managed Care – PPO

## 2014-03-02 DIAGNOSIS — Z0000 Encounter for general adult medical examination without abnormal findings: Secondary | ICD-10-CM

## 2014-03-02 LAB — BASIC METABOLIC PANEL
BUN: 15 mg/dL (ref 6–23)
CO2: 26 mEq/L (ref 19–32)
Calcium: 9 mg/dL (ref 8.4–10.5)
Chloride: 106 mEq/L (ref 96–112)
Creatinine, Ser: 1.3 mg/dL (ref 0.4–1.5)
GFR: 61.55 mL/min (ref >60.00)
Glucose, Bld: 94 mg/dL (ref 70–99)
Potassium: 4.7 mEq/L (ref 3.5–5.1)
Sodium: 137 mEq/L (ref 135–145)

## 2014-03-02 LAB — CBC WITH DIFFERENTIAL/PLATELET
Basophils Absolute: 0 10*3/uL (ref 0.0–0.1)
Basophils Relative: 0.4 % (ref 0.0–3.0)
Eosinophils Absolute: 0.1 10*3/uL (ref 0.0–0.7)
Eosinophils Relative: 2.2 % (ref 0.0–5.0)
HCT: 42.9 % (ref 39.0–52.0)
Hemoglobin: 14.3 g/dL (ref 13.0–17.0)
Lymphocytes Relative: 22.9 % (ref 12.0–46.0)
Lymphs Abs: 1.5 10*3/uL (ref 0.7–4.0)
MCHC: 33.3 g/dL (ref 30.0–36.0)
MCV: 82.7 fl (ref 78.0–100.0)
Monocytes Absolute: 0.4 10*3/uL (ref 0.1–1.0)
Monocytes Relative: 5.9 % (ref 3.0–12.0)
Neutro Abs: 4.4 10*3/uL (ref 1.4–7.7)
Neutrophils Relative %: 68.6 % (ref 43.0–77.0)
Platelets: 213 10*3/uL (ref 150.0–400.0)
RBC: 5.19 Mil/uL (ref 4.22–5.81)
RDW: 15.4 % — ABNORMAL HIGH (ref 11.5–14.6)
WBC: 6.5 10*3/uL (ref 4.5–10.5)

## 2014-03-02 LAB — HEPATIC FUNCTION PANEL
ALT: 19 U/L (ref 0–53)
AST: 17 U/L (ref 0–37)
Albumin: 3.6 g/dL (ref 3.5–5.2)
Alkaline Phosphatase: 77 U/L (ref 39–117)
Bilirubin, Direct: 0.1 mg/dL (ref 0.0–0.3)
Total Bilirubin: 0.7 mg/dL (ref 0.3–1.2)
Total Protein: 6.7 g/dL (ref 6.0–8.3)

## 2014-03-02 LAB — POCT URINALYSIS DIPSTICK
Bilirubin, UA: NEGATIVE
Blood, UA: NEGATIVE
Glucose, UA: NEGATIVE
Ketones, UA: NEGATIVE
Leukocytes, UA: NEGATIVE
Nitrite, UA: NEGATIVE
Protein, UA: NEGATIVE
Spec Grav, UA: 1.015
Urobilinogen, UA: 0.2
pH, UA: 7

## 2014-03-02 LAB — LIPID PANEL
Cholesterol: 198 mg/dL (ref 0–200)
HDL: 38.5 mg/dL — ABNORMAL LOW (ref >39.00)
LDL Cholesterol: 144 mg/dL — ABNORMAL HIGH (ref 0–99)
Total CHOL/HDL Ratio: 5
Triglycerides: 78 mg/dL (ref 0.0–149.0)
VLDL: 15.6 mg/dL (ref 0.0–40.0)

## 2014-03-02 LAB — TSH: TSH: 2.32 u[IU]/mL (ref 0.35–5.50)

## 2014-03-02 LAB — PSA: PSA: 1.62 ng/mL (ref 0.10–4.00)

## 2014-03-09 ENCOUNTER — Encounter: Payer: Commercial Managed Care - PPO | Admitting: Internal Medicine

## 2014-03-21 ENCOUNTER — Ambulatory Visit (INDEPENDENT_AMBULATORY_CARE_PROVIDER_SITE_OTHER): Payer: BC Managed Care – PPO | Admitting: Internal Medicine

## 2014-03-21 ENCOUNTER — Encounter: Payer: Self-pay | Admitting: Internal Medicine

## 2014-03-21 VITALS — BP 124/90 | HR 92 | Temp 98.4°F | Resp 20 | Ht 67.5 in | Wt 234.0 lb

## 2014-03-21 DIAGNOSIS — F329 Major depressive disorder, single episode, unspecified: Secondary | ICD-10-CM

## 2014-03-21 DIAGNOSIS — Z0000 Encounter for general adult medical examination without abnormal findings: Secondary | ICD-10-CM

## 2014-03-21 DIAGNOSIS — Z85528 Personal history of other malignant neoplasm of kidney: Secondary | ICD-10-CM

## 2014-03-21 DIAGNOSIS — Z860100 Personal history of colon polyps, unspecified: Secondary | ICD-10-CM | POA: Insufficient documentation

## 2014-03-21 DIAGNOSIS — F32A Depression, unspecified: Secondary | ICD-10-CM

## 2014-03-21 DIAGNOSIS — F3289 Other specified depressive episodes: Secondary | ICD-10-CM

## 2014-03-21 DIAGNOSIS — I493 Ventricular premature depolarization: Secondary | ICD-10-CM

## 2014-03-21 DIAGNOSIS — I4949 Other premature depolarization: Secondary | ICD-10-CM

## 2014-03-21 MED ORDER — BUPROPION HCL ER (XL) 300 MG PO TB24: ORAL_TABLET | ORAL | Status: DC

## 2014-03-21 NOTE — Progress Notes (Signed)
Pre-visit discussion using our clinic review tool. No additional management support is needed unless otherwise documented below in the visit note.  

## 2014-03-21 NOTE — Progress Notes (Signed)
Subjective:    Patient ID: Alan Tyler, male    DOB: Jul 17, 1956, 58 y.o.   MRN: [id number]  HPI  58 year old patient who is in today for a wellness exam.  He has a history of benign PVCs and was seen by cardiology in 2000 at 80 and had a negative stress echocardiogram at that time.  He does have a history of anxiety, depression, and has done well on bupropion.  He increased this medication to 300 mg 2 weeks ago and has done much better.  He did have colonoscopy last year.  He does have a history of colonic polyps  Past Medical History  Diagnosis Date  . Arthritis   . Depression   . GERD (gastroesophageal reflux disease)   . PVC's (premature ventricular contractions)     arrhythmia  . Hyperlipidemia   . Chronic kidney disease     kidney stones and (R) nephrectomy  . Colon polyps   . Cancer     kidney - 2003    History   Social History  . Marital Status: Married    Spouse Name: N/A    Number of Children: 3  . Years of Education: N/A   Occupational History  . manufacturing-unemployed    Social History Main Topics  . Smoking status: Former Smoker    Quit date: 12/29/2001  . Smokeless tobacco: Former Systems developer    Quit date: 12/27/2002  . Alcohol Use: Yes     Comment: rarely  . Drug Use: No  . Sexual Activity: Not on file   Other Topics Concern  . Not on file   Social History Narrative  . No narrative on file    Past Surgical History  Procedure Laterality Date  . Nephrectomy  2003    (R) for cancer tumor  . Retinal detachment surgery  2012  . Cataract extraction      right eye    Family History  Problem Relation Age of Onset  . Arthritis Mother   . Depression Mother   . Colon cancer Mother     age 36 part of colon was removed  . Alcohol abuse Father   . Heart disease Father   . Hyperlipidemia Father   . Hyperlipidemia Maternal Grandmother   . Hyperlipidemia Maternal Grandfather   . Hyperlipidemia Paternal Grandmother   . Diabetes Paternal Grandmother     . Arthritis Paternal Grandfather   . Hyperlipidemia Paternal Grandfather   . Stomach cancer Neg Hx   . Esophageal cancer Neg Hx   . Rectal cancer Neg Hx     Allergies  Allergen Reactions  . Sulfa Drugs Cross Reactors     Per pt: unknown    Current Outpatient Prescriptions on File Prior to Visit  Medication Sig Dispense Refill  . ALPRAZolam (XANAX) 0.5 MG tablet TAKE 1 TABLET BY MOUTH DAILY AS NEEDED  60 tablet  3  . aspirin 81 MG tablet Take 81 mg by mouth daily.        Marland Kitchen ibuprofen (ADVIL,MOTRIN) 200 MG tablet Take 200 mg by mouth every 6 (six) hours as needed.        . Omega-3 Fatty Acids (OMEGA 3 PO) Take by mouth. Take 3,300mg  (2 capsules) daily      . omeprazole (PRILOSEC) 20 MG capsule Take 1 capsule (20 mg total) by mouth daily.  90 capsule  4  . OVER THE COUNTER MEDICATION NATURAL PSYLIUM HUSK FIBER 0.52 grams,(5Capsules) daily       No current  facility-administered medications on file prior to visit.    BP 124/90  Pulse 92  Temp(Src) 98.4 F (36.9 C) (Oral)  Resp 20  Ht 5' 7.5" (1.715 m)  Wt 234 lb (106.142 kg)  BMI 36.09 kg/m2  SpO2 98%       Review of Systems  Constitutional: Negative for fever, chills, activity change, appetite change and fatigue.  HENT: Negative for congestion, dental problem, ear pain, hearing loss, mouth sores, rhinorrhea, sinus pressure, sneezing, tinnitus, trouble swallowing and voice change.   Eyes: Negative for photophobia, pain, redness and visual disturbance.  Respiratory: Negative for apnea, cough, choking, chest tightness, shortness of breath and wheezing.   Cardiovascular: Negative for chest pain, palpitations and leg swelling.  Gastrointestinal: Negative for nausea, vomiting, abdominal pain, diarrhea, constipation, blood in stool, abdominal distention, anal bleeding and rectal pain.  Genitourinary: Negative for dysuria, urgency, frequency, hematuria, flank pain, decreased urine volume, discharge, penile swelling, scrotal  swelling, difficulty urinating, genital sores and testicular pain.  Musculoskeletal: Negative for arthralgias, back pain, gait problem, joint swelling, myalgias, neck pain and neck stiffness.  Skin: Negative for color change, rash and wound.  Neurological: Negative for dizziness, tremors, seizures, syncope, facial asymmetry, speech difficulty, weakness, light-headedness, numbness and headaches.  Hematological: Negative for adenopathy. Does not bruise/bleed easily.  Psychiatric/Behavioral: Negative for suicidal ideas, hallucinations, behavioral problems, confusion, sleep disturbance, self-injury, dysphoric mood, decreased concentration and agitation. The patient is not nervous/anxious.        Objective:   Physical Exam  Constitutional: He appears well-developed and well-nourished.  HENT:  Head: Normocephalic and atraumatic.  Right Ear: External ear normal.  Left Ear: External ear normal.  Nose: Nose normal.  Mouth/Throat: Oropharynx is clear and moist.  Eyes: Conjunctivae and EOM are normal. Pupils are equal, round, and reactive to light. No scleral icterus.  Neck: Normal range of motion. Neck supple. No JVD present. No thyromegaly present.  Cardiovascular: Regular rhythm, normal heart sounds and intact distal pulses.  Exam reveals no gallop and no friction rub.   No murmur heard. Pulmonary/Chest: Effort normal and breath sounds normal. He exhibits no tenderness.  Abdominal: Soft. Bowel sounds are normal. He exhibits no distension and no mass. There is no tenderness.  Genitourinary: Prostate normal and penis normal. Guaiac negative stool.  Prostate minimally symmetrically enlarged  Musculoskeletal: Normal range of motion. He exhibits no edema and no tenderness.  Lymphadenopathy:    He has no cervical adenopathy.  Neurological: He is alert. He has normal reflexes. No cranial nerve deficit. Coordination normal.  Skin: Skin is warm and dry. No rash noted.  Psychiatric: He has a normal mood  and affect. His behavior is normal.          Assessment & Plan:   Preventive health examination Benign PVCs Personal history colonic polyps.  Continue five-year surveillance intervals History of depression, stable and improved on increased dose of bupropion.  We'll continue present dose Status post nephrectomy for renal cancer  Followup one year or as needed

## 2014-03-21 NOTE — Patient Instructions (Signed)
It is important that you exercise regularly, at least 20 minutes 3 to 4 times per week.  If you develop chest pain or shortness of breath seek  medical attention.  You need to lose weight.  Consider a lower calorie diet and regular exercise.  Return in one year for follow-up   

## 2014-03-26 ENCOUNTER — Encounter: Payer: Self-pay | Admitting: Internal Medicine

## 2014-03-27 ENCOUNTER — Other Ambulatory Visit: Payer: Self-pay | Admitting: Internal Medicine

## 2014-03-27 DIAGNOSIS — F988 Other specified behavioral and emotional disorders with onset usually occurring in childhood and adolescence: Secondary | ICD-10-CM

## 2014-03-30 ENCOUNTER — Telehealth: Payer: Self-pay | Admitting: Internal Medicine

## 2014-03-30 NOTE — Telephone Encounter (Signed)
Wife is calling for her husband and wanting to know if Dr. Raliegh Ip would be able to prescribe his ADD medication Adderall until he can see the Psychiatrist in May, 2015. He is in a stressful job and is struggling to stay focused due to his ADD.

## 2014-04-02 ENCOUNTER — Other Ambulatory Visit: Payer: Self-pay | Admitting: Internal Medicine

## 2014-04-03 NOTE — Telephone Encounter (Signed)
Dr. Raliegh Ip, pt has not taken Adderall for ten years and does not remember dose. Please advise

## 2014-04-03 NOTE — Telephone Encounter (Signed)
yes

## 2014-04-03 NOTE — Telephone Encounter (Signed)
Left message on voicemail to call office.  

## 2014-04-03 NOTE — Telephone Encounter (Signed)
Needs follow up office visit.

## 2014-04-03 NOTE — Telephone Encounter (Signed)
Spoke to pt told him Dr.K said he would prescribe Adderall for him, asked pt what dosage? Pt stated he does not remember it has been ten years since he took it. Told pt okay will send message to Dr. Raliegh Ip to see what he recommends and get back to him. Pt verbalized understanding.

## 2014-04-03 NOTE — Telephone Encounter (Signed)
Please advise 

## 2014-04-04 NOTE — Telephone Encounter (Signed)
Left detailed message on pt's voicemail needs follow up appointment to discuss Adderall medication.

## 2014-04-28 ENCOUNTER — Encounter: Payer: Self-pay | Admitting: Internal Medicine

## 2014-04-28 ENCOUNTER — Ambulatory Visit (INDEPENDENT_AMBULATORY_CARE_PROVIDER_SITE_OTHER): Payer: BC Managed Care – PPO | Admitting: Internal Medicine

## 2014-04-28 VITALS — BP 120/84 | HR 94 | Temp 98.3°F | Wt 232.0 lb

## 2014-04-28 DIAGNOSIS — F3289 Other specified depressive episodes: Secondary | ICD-10-CM

## 2014-04-28 DIAGNOSIS — F329 Major depressive disorder, single episode, unspecified: Secondary | ICD-10-CM

## 2014-04-28 DIAGNOSIS — F988 Other specified behavioral and emotional disorders with onset usually occurring in childhood and adolescence: Secondary | ICD-10-CM

## 2014-04-28 DIAGNOSIS — F32A Depression, unspecified: Secondary | ICD-10-CM

## 2014-04-28 MED ORDER — AMPHETAMINE-DEXTROAMPHET ER 20 MG PO CP24: 20.0000 mg | ORAL_CAPSULE | Freq: Every day | ORAL | Status: DC

## 2014-04-28 MED ORDER — AMPHETAMINE-DEXTROAMPHETAMINE 10 MG PO TABS: 10.0000 mg | ORAL_TABLET | Freq: Two times a day (BID) | ORAL | Status: DC

## 2014-04-28 MED ORDER — ALPRAZOLAM 0.5 MG PO TABS: ORAL_TABLET | ORAL | Status: DC

## 2014-04-28 NOTE — Progress Notes (Signed)
Pre visit review using our clinic review tool, if applicable. No additional management support is needed unless otherwise documented below in the visit note. 

## 2014-04-28 NOTE — Progress Notes (Signed)
Subjective:    Patient ID: Alan Tyler, male    DOB: 10/30/1956, 58 y.o.   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  He was treated by Dr. Lajuana Ripple 15-20 years ago for ADD and OCD.  He did quite well on a regimen of Adderall and wellbutrin.  He has a history of depression and remains on this medication.  Presently , he has started a fairly recent job with long hours and significant demands.  He is having a very difficult time completing projects and keeping up.  This does cause considerable anxiety and he feels it may be threatening his job.  Past Medical History  Diagnosis Date  . Arthritis   . Depression   . GERD (gastroesophageal reflux disease)   . PVC's (premature ventricular contractions)     arrhythmia  . Hyperlipidemia   . Chronic kidney disease     kidney stones and (R) nephrectomy  . Colon polyps   . Cancer     kidney - 2003    History   Social History  . Marital Status: Married    Spouse Name: N/A    Number of Children: 3  . Years of Education: N/A   Occupational History  . manufacturing-unemployed    Social History Main Topics  . Smoking status: Former Smoker    Quit date: 12/29/2001  . Smokeless tobacco: Former Systems developer    Quit date: 12/27/2002  . Alcohol Use: Yes     Comment: rarely  . Drug Use: No  . Sexual Activity: Not on file   Other Topics Concern  . Not on file   Social History Narrative  . No narrative on file    Past Surgical History  Procedure Laterality Date  . Nephrectomy  2003    (R) for cancer tumor  . Retinal detachment surgery  2012  . Cataract extraction      right eye    Family History  Problem Relation Age of Onset  . Arthritis Mother   . Depression Mother   . Colon cancer Mother     age 22 part of colon was removed  . Alcohol abuse Father   . Heart disease Father   . Hyperlipidemia Father   . Hyperlipidemia Maternal Grandmother   . Hyperlipidemia Maternal  Grandfather   . Hyperlipidemia Paternal Grandmother   . Diabetes Paternal Grandmother   . Arthritis Paternal Grandfather   . Hyperlipidemia Paternal Grandfather   . Stomach cancer Neg Hx   . Esophageal cancer Neg Hx   . Rectal cancer Neg Hx     Allergies  Allergen Reactions  . Sulfa Drugs Cross Reactors     Per pt: unknown    Current Outpatient Prescriptions on File Prior to Visit  Medication Sig Dispense Refill  . aspirin 81 MG tablet Take 81 mg by mouth daily.        Marland Kitchen buPROPion (WELLBUTRIN XL) 300 MG 24 hr tablet TAKE 1 TABLET EVERY MORNING  90 tablet  4  . ibuprofen (ADVIL,MOTRIN) 200 MG tablet Take 200 mg by mouth every 6 (six) hours as needed.        . Omega-3 Fatty Acids (OMEGA 3 PO) Take by mouth. Take 3,300mg  (2 capsules) daily      . omeprazole (PRILOSEC) 20 MG capsule Take 1 capsule (20 mg total) by mouth daily.  90 capsule  4  . OVER THE COUNTER MEDICATION NATURAL PSYLIUM HUSK FIBER 0.52 grams,(5Capsules)  daily       No current facility-administered medications on file prior to visit.    BP 120/84  Pulse 94  Temp(Src) 98.3 F (36.8 C) (Oral)  Wt 232 lb (105.235 kg)  SpO2 98%       Review of Systems  Constitutional: Negative for fever, chills, appetite change and fatigue.  HENT: Negative for congestion, dental problem, ear pain, hearing loss, sore throat, tinnitus, trouble swallowing and voice change.   Eyes: Negative for pain, discharge and visual disturbance.  Respiratory: Negative for cough, chest tightness, wheezing and stridor.   Cardiovascular: Negative for chest pain, palpitations and leg swelling.  Gastrointestinal: Negative for nausea, vomiting, abdominal pain, diarrhea, constipation, blood in stool and abdominal distention.  Genitourinary: Negative for urgency, hematuria, flank pain, discharge, difficulty urinating and genital sores.  Musculoskeletal: Negative for arthralgias, back pain, gait problem, joint swelling, myalgias and neck stiffness.    Skin: Negative for rash.  Neurological: Negative for dizziness, syncope, speech difficulty, weakness, numbness and headaches.  Hematological: Negative for adenopathy. Does not bruise/bleed easily.  Psychiatric/Behavioral: Positive for dysphoric mood and decreased concentration. Negative for behavioral problems. The patient is nervous/anxious.        Objective:   Physical Exam  Constitutional: He is oriented to person, place, and time. He appears well-developed and well-nourished. No distress.  Blood pressure 120/84  Abdominal: Bowel sounds are normal.  Musculoskeletal: He exhibits no edema and no tenderness.  Neurological: He is alert and oriented to person, place, and time.  Psychiatric: He has a normal mood and affect. His behavior is normal. Judgment and thought content normal.          Assessment & Plan:   A D. D.  Will resume Adderall at a dose of 20 mg extended release.  We'll also give a prescription for short acting 10 mg to use as needed.   Depression.  Continue Wellbutrin  Followup 3 months or sooner if suboptimal response to medication

## 2014-04-28 NOTE — Patient Instructions (Signed)
Amphetamine; Dextroamphetamine extended-release capsules What is this medicine? AMPHETAMINE; DEXTROAMPHETAMINE (am FET a meen; dex troe am FET a meen) is used to treat attention-deficit hyperactivity disorder (ADHD). Federal law prohibits giving this medicine to any person other than the person for whom it was prescribed. Do not share this medicine with anyone else. This medicine may be used for other purposes; ask your health care provider or pharmacist if you have questions. COMMON BRAND NAME(S): Adderall XR What should I tell my health care provider before I take this medicine? They need to know if you have any of these conditions: -anxiety or panic attacks -circulation problems in fingers and toes -glaucoma -hardening or blockages of the arteries or heart blood vessels -heart disease or a heart defect -high blood pressure -history of a drug or alcohol abuse problem -history of stroke -kidney disease -liver disease -mental illness -seizures -suicidal thoughts, plans, or attempt; a previous suicide attempt by you or a family member -thyroid disease -Tourette's syndrome -an unusual or allergic reaction to dextroamphetamine, other amphetamines, other medicines, foods, dyes, or preservatives -pregnant or trying to get pregnant -breast-feeding How should I use this medicine? Take this medicine by mouth with a glass of water. Follow the directions on the prescription label. This medicine is taken just one time per day, usually in the morning after waking up. Take with or without food. Do not chew or crush this medicine. You may open the capsules and sprinkle the medicine onto a spoon of applesauce. If sprinkled on applesauce, take the dose immediately and do not crush or chew. Always drink a glass of water or other liquid after taking this medicine. Do not take your medicine more often than directed. A special MedGuide will be given to you by the pharmacist with each prescription and refill.  Be sure to read this information carefully each time. Talk to your pediatrician regarding the use of this medicine in children. While this drug may be prescribed for children as young as 6 years for selected conditions, precautions do apply. Overdosage: If you think you have taken too much of this medicine contact a poison control center or emergency room at once. NOTE: This medicine is only for you. Do not share this medicine with others. What if I miss a dose? If you miss a dose, take it as soon as you can. If it is almost time for your next dose, take only that dose. Do not take double or extra doses. What may interact with this medicine? Do not take this medicine with any of the following medications: -certain medicines for migraine headache like almotriptan, eletriptan, frovatriptan, naratriptan, rizatriptan, sumatriptan, zolmitriptan -MAOIs like Carbex, Eldepryl, Marplan, Nardil, and Parnate -melatonin -meperidine -other stimulant medicines for attention disorders, weight loss, or to stay awake -pimozide -procarbazine This medicine may also interact with the following medications: -acetazolamide -ammonium chloride -antacids -ascorbic acid -atomoxetine -caffeine -certain medicines for blood pressure -certain medicines for depression, anxiety, or psychotic disturbances -certain medicines for diabetes -certain medicines for seizures like carbamazepine, phenobarbital, phenytoin -certain medicines for stomach problems like cimetidine, famotidine, omeprazole, lansoprazole -cold or allergy medicines -glutamic acid -methenamine -narcotic medicines for pain -norepinephrine -phenothiazines like chlorpromazine, mesoridazine, prochlorperazine, thioridazine -sodium acid phosphate -sodium bicarbonate This list may not describe all possible interactions. Give your health care provider a list of all the medicines, herbs, non-prescription drugs, or dietary supplements you use. Also tell them  if you smoke, drink alcohol, or use illegal drugs. Some items may interact with your medicine. What  should I watch for while using this medicine? Visit your doctor or health care professional for regular checks on your progress. This prescription requires that you follow special procedures with your doctor and pharmacy. You will need to have a new written prescription from your doctor every time you need a refill. This medicine may affect your concentration, or hide signs of tiredness. Until you know how this medicine affects you, do not drive, ride a bicycle, use machinery, or do anything that needs mental alertness. Tell your doctor or health care professional if this medicine loses its effects, or if you feel you need to take more than the prescribed amount. Do not change the dosage without talking to your doctor or health care professional. Decreased appetite is a common side effect when starting this medicine. Eating small, frequent meals or snacks can help. Talk to your doctor if you continue to have poor eating habits. Height and weight growth of a child taking this medicine will be monitored closely. Do not take this medicine close to bedtime. It may prevent you from sleeping. If you are going to need surgery, an MRI, a CT scan, or other procedure, tell your doctor that you are taking this medicine. You may need to stop taking this medicine before the procedure. Tell your doctor or healthcare professional right away if you notice unexplained wounds on your fingers and toes while taking this medicine. You should also tell your healthcare provider if you experience numbness or pain, changes in the skin color, or sensitivity to temperature in your fingers or toes. What side effects may I notice from receiving this medicine? Side effects that you should report to your doctor or health care professional as soon as possible: -allergic reactions like skin rash, itching or hives, swelling of the face,  lips, or tongue -changes in vision -chest pain or chest tightness -confusion, trouble speaking or understanding -fast, irregular heartbeat -fingers or toes feel numb, cool, painful -hallucination, loss of contact with reality -high blood pressure -males: prolonged or painful erection -seizures -severe headaches -shortness of breath -suicidal thoughts or other mood changes -trouble walking, dizziness, loss of balance or coordination -uncontrollable head, mouth, neck, arm, or leg movements  Side effects that usually do not require medical attention (report to your doctor or health care professional if they continue or are bothersome): -anxious -headache -loss of appetite -nausea, vomiting -trouble sleeping -weight loss This list may not describe all possible side effects. Call your doctor for medical advice about side effects. You may report side effects to FDA at 1-800-FDA-1088. Where should I keep my medicine? Keep out of the reach of children. This medicine can be abused. Keep your medicine in a safe place to protect it from theft. Do not share this medicine with anyone. Selling or giving away this medicine is dangerous and against the law. Store at room temperature between 15 and 30 degrees C (59 and 86 degrees F). Keep container tightly closed. Protect from light. Throw away any unused medicine after the expiration date. NOTE: This sheet is a summary. It may not cover all possible information. If you have questions about this medicine, talk to your doctor, pharmacist, or health care provider.  2014, [employer]. (2013-09-05 13:36:47)

## 2014-07-02 ENCOUNTER — Other Ambulatory Visit: Payer: Self-pay | Admitting: Internal Medicine

## 2014-07-03 ENCOUNTER — Other Ambulatory Visit: Payer: Self-pay | Admitting: *Deleted

## 2014-07-03 MED ORDER — AMPHETAMINE-DEXTROAMPHETAMINE 10 MG PO TABS: 10.0000 mg | ORAL_TABLET | Freq: Two times a day (BID) | ORAL | Status: DC

## 2014-07-03 MED ORDER — AMPHETAMINE-DEXTROAMPHET ER 20 MG PO CP24: 20.0000 mg | ORAL_CAPSULE | Freq: Every day | ORAL | Status: DC

## 2014-07-03 NOTE — Telephone Encounter (Signed)
Pt called back asked him if he needed both Rx's for Adderall? Pt said yes he will run out till appointment. Told him okay will be ready this afternoon. Pt verbalized understanding.

## 2014-07-04 NOTE — Telephone Encounter (Signed)
Rx's printed and signed yesterday, put at front desk for pickup.

## 2014-07-31 ENCOUNTER — Encounter: Payer: Self-pay | Admitting: Internal Medicine

## 2014-07-31 ENCOUNTER — Ambulatory Visit (INDEPENDENT_AMBULATORY_CARE_PROVIDER_SITE_OTHER): Payer: BC Managed Care – PPO | Admitting: Internal Medicine

## 2014-07-31 VITALS — BP 130/90 | HR 90 | Temp 98.0°F | Resp 20 | Ht 67.5 in | Wt 229.0 lb

## 2014-07-31 DIAGNOSIS — F988 Other specified behavioral and emotional disorders with onset usually occurring in childhood and adolescence: Secondary | ICD-10-CM

## 2014-07-31 DIAGNOSIS — R002 Palpitations: Secondary | ICD-10-CM

## 2014-07-31 MED ORDER — AMPHETAMINE-DEXTROAMPHET ER 20 MG PO CP24: 20.0000 mg | ORAL_CAPSULE | Freq: Every day | ORAL | Status: DC

## 2014-07-31 MED ORDER — AMPHETAMINE-DEXTROAMPHETAMINE 10 MG PO TABS: 10.0000 mg | ORAL_TABLET | Freq: Two times a day (BID) | ORAL | Status: DC

## 2014-07-31 NOTE — Progress Notes (Signed)
Subjective:    Patient ID: Alan Tyler, male    DOB: March 09, 1956, 58 y.o.   MRN: [id number]  HPI  58 year old patient who has a long history of ADD and was resumed recently on his prior regimen of Adderall.  He has done quite well and is quite pleased with his clinical response.  He has a much more effective at work and is tolerating the medication well.  He does have a history of palpitations which have been stable.  Past Medical History  Diagnosis Date  . Arthritis   . Depression   . GERD (gastroesophageal reflux disease)   . PVC's (premature ventricular contractions)     arrhythmia  . Hyperlipidemia   . Chronic kidney disease     kidney stones and (R) nephrectomy  . Colon polyps   . Cancer     kidney - 2003    History   Social History  . Marital Status: Married    Spouse Name: N/A    Number of Children: 3  . Years of Education: N/A   Occupational History  . manufacturing-unemployed    Social History Main Topics  . Smoking status: Former Smoker    Quit date: 12/29/2001  . Smokeless tobacco: Former Systems developer    Quit date: 12/27/2002  . Alcohol Use: Yes     Comment: rarely  . Drug Use: No  . Sexual Activity: Not on file   Other Topics Concern  . Not on file   Social History Narrative  . No narrative on file    Past Surgical History  Procedure Laterality Date  . Nephrectomy  2003    (R) for cancer tumor  . Retinal detachment surgery  2012  . Cataract extraction      right eye    Family History  Problem Relation Age of Onset  . Arthritis Mother   . Depression Mother   . Colon cancer Mother     age 71 part of colon was removed  . Alcohol abuse Father   . Heart disease Father   . Hyperlipidemia Father   . Hyperlipidemia Maternal Grandmother   . Hyperlipidemia Maternal Grandfather   . Hyperlipidemia Paternal Grandmother   . Diabetes Paternal Grandmother   . Arthritis Paternal Grandfather   . Hyperlipidemia Paternal Grandfather   . Stomach cancer Neg  Hx   . Esophageal cancer Neg Hx   . Rectal cancer Neg Hx     Allergies  Allergen Reactions  . Sulfa Drugs Cross Reactors     Per pt: unknown    Current Outpatient Prescriptions on File Prior to Visit  Medication Sig Dispense Refill  . ALPRAZolam (XANAX) 0.5 MG tablet TAKE 1 TABLET BY MOUTH DAILY AS NEEDED  60 tablet  2  . aspirin 81 MG tablet Take 81 mg by mouth daily.        Marland Kitchen buPROPion (WELLBUTRIN XL) 300 MG 24 hr tablet TAKE 1 TABLET EVERY MORNING  90 tablet  4  . ibuprofen (ADVIL,MOTRIN) 200 MG tablet Take 200 mg by mouth every 6 (six) hours as needed.        . Omega-3 Fatty Acids (OMEGA 3 PO) Take by mouth. Take 3,300mg  (2 capsules) daily      . omeprazole (PRILOSEC) 20 MG capsule Take 1 capsule (20 mg total) by mouth daily.  90 capsule  4  . OVER THE COUNTER MEDICATION NATURAL PSYLIUM HUSK FIBER 0.52 grams,(5Capsules) daily       No current facility-administered medications on file  prior to visit.    BP 130/90  Pulse 90  Temp(Src) 98 F (36.7 C) (Oral)  Resp 20  Ht 5' 7.5" (1.715 m)  Wt 229 lb (103.874 kg)  BMI 35.32 kg/m2  SpO2 98%     Review of Systems  Constitutional: Negative for fever, chills, appetite change and fatigue.  HENT: Negative for congestion, dental problem, ear pain, hearing loss, sore throat, tinnitus, trouble swallowing and voice change.   Eyes: Negative for pain, discharge and visual disturbance.  Respiratory: Negative for cough, chest tightness, wheezing and stridor.   Cardiovascular: Negative for chest pain, palpitations and leg swelling.  Gastrointestinal: Negative for nausea, vomiting, abdominal pain, diarrhea, constipation, blood in stool and abdominal distention.  Genitourinary: Negative for urgency, hematuria, flank pain, discharge, difficulty urinating and genital sores.  Musculoskeletal: Negative for arthralgias, back pain, gait problem, joint swelling, myalgias and neck stiffness.  Skin: Negative for rash.  Neurological: Negative for  dizziness, syncope, speech difficulty, weakness, numbness and headaches.  Hematological: Negative for adenopathy. Does not bruise/bleed easily.  Psychiatric/Behavioral: Negative for behavioral problems and dysphoric mood. The patient is not nervous/anxious.        Objective:   Physical Exam  Constitutional: He appears well-developed and well-nourished. No distress.  Wt Readings from Last 3 Encounters: 07/31/14 : 229 lb (103.874 kg) 04/28/14 : 232 lb (105.235 kg) 03/21/14 : 234 lb (106.142 kg)  Repeat blood pressure  120/84          Assessment & Plan:  A D. D. well-controlled on present regimen.  Will continue.  Medications refilled We'll see in March for his annual exam

## 2014-07-31 NOTE — Progress Notes (Signed)
Pre visit review using our clinic review tool, if applicable. No additional management support is needed unless otherwise documented below in the visit note. 

## 2014-07-31 NOTE — Patient Instructions (Signed)
Limit your sodium (Salt) intake  Return in 6 months for follow-up  

## 2014-10-16 ENCOUNTER — Other Ambulatory Visit: Payer: Self-pay | Admitting: Internal Medicine

## 2014-12-05 ENCOUNTER — Telehealth: Payer: Self-pay | Admitting: Internal Medicine

## 2014-12-05 MED ORDER — AMPHETAMINE-DEXTROAMPHET ER 20 MG PO CP24: 20.0000 mg | ORAL_CAPSULE | Freq: Every day | ORAL | Status: DC

## 2014-12-05 NOTE — Telephone Encounter (Signed)
Pt needs new rx generic adderall xr 20 mg. Pt is aware md out of office. Pt is out

## 2014-12-05 NOTE — Telephone Encounter (Signed)
Left message on voicemail to call office. Need to know if he also needs Adderall 10 mg?

## 2014-12-05 NOTE — Telephone Encounter (Signed)
Pt called back, said he only needs Adderall XR 20 mg. Told him okay can only give one month supply due to Dr. Raliegh Ip is out. Rx will be ready to day. Pt verbalized understanding. Rx printed and signed by Dr. Sherren Mocha.

## 2014-12-07 ENCOUNTER — Encounter: Payer: Self-pay | Admitting: Internal Medicine

## 2015-01-03 ENCOUNTER — Telehealth: Payer: Self-pay | Admitting: Internal Medicine

## 2015-01-03 MED ORDER — AMPHETAMINE-DEXTROAMPHETAMINE 10 MG PO TABS: 10.0000 mg | ORAL_TABLET | Freq: Two times a day (BID) | ORAL | Status: DC

## 2015-01-03 MED ORDER — AMPHETAMINE-DEXTROAMPHET ER 20 MG PO CP24: 20.0000 mg | ORAL_CAPSULE | Freq: Every day | ORAL | Status: DC

## 2015-01-03 NOTE — Telephone Encounter (Signed)
Pt request refill amphetamine-dextroamphetamine (ADDERALL XR) 20 MG 24 hr capsule  and amphetamine-dextroamphetamine (ADDERALL) 10 MG tablet 3 mo Pt has appt in March.

## 2015-01-03 NOTE — Telephone Encounter (Signed)
Left detailed message Rx's ready for pickup, will be at the front desk. Rx's printed and signed. 

## 2015-01-16 ENCOUNTER — Encounter (HOSPITAL_COMMUNITY): Payer: Self-pay | Admitting: Neurology

## 2015-01-16 ENCOUNTER — Emergency Department (HOSPITAL_COMMUNITY): Payer: BLUE CROSS/BLUE SHIELD

## 2015-01-16 ENCOUNTER — Emergency Department (HOSPITAL_COMMUNITY)
Admission: EM | Admit: 2015-01-16 | Discharge: 2015-01-16 | Disposition: A | Discharge status: Home / Self Care | Payer: BLUE CROSS/BLUE SHIELD | Attending: Emergency Medicine | Admitting: Emergency Medicine

## 2015-01-16 DIAGNOSIS — S8992XA Unspecified injury of left lower leg, initial encounter: Secondary | ICD-10-CM | POA: Diagnosis present

## 2015-01-16 DIAGNOSIS — M1990 Unspecified osteoarthritis, unspecified site: Secondary | ICD-10-CM | POA: Diagnosis not present

## 2015-01-16 DIAGNOSIS — Y9289 Other specified places as the place of occurrence of the external cause: Secondary | ICD-10-CM | POA: Diagnosis not present

## 2015-01-16 DIAGNOSIS — Z86010 Personal history of colonic polyps: Secondary | ICD-10-CM | POA: Diagnosis not present

## 2015-01-16 DIAGNOSIS — K219 Gastro-esophageal reflux disease without esophagitis: Secondary | ICD-10-CM | POA: Diagnosis not present

## 2015-01-16 DIAGNOSIS — Z79899 Other long term (current) drug therapy: Secondary | ICD-10-CM | POA: Insufficient documentation

## 2015-01-16 DIAGNOSIS — X58XXXA Exposure to other specified factors, initial encounter: Secondary | ICD-10-CM | POA: Diagnosis not present

## 2015-01-16 DIAGNOSIS — Z8679 Personal history of other diseases of the circulatory system: Secondary | ICD-10-CM | POA: Diagnosis not present

## 2015-01-16 DIAGNOSIS — Y9301 Activity, walking, marching and hiking: Secondary | ICD-10-CM | POA: Diagnosis not present

## 2015-01-16 DIAGNOSIS — Z791 Long term (current) use of non-steroidal anti-inflammatories (NSAID): Secondary | ICD-10-CM | POA: Diagnosis not present

## 2015-01-16 DIAGNOSIS — Z85528 Personal history of other malignant neoplasm of kidney: Secondary | ICD-10-CM | POA: Diagnosis not present

## 2015-01-16 DIAGNOSIS — Z8639 Personal history of other endocrine, nutritional and metabolic disease: Secondary | ICD-10-CM | POA: Insufficient documentation

## 2015-01-16 DIAGNOSIS — Z87891 Personal history of nicotine dependence: Secondary | ICD-10-CM | POA: Diagnosis not present

## 2015-01-16 DIAGNOSIS — Z87442 Personal history of urinary calculi: Secondary | ICD-10-CM | POA: Diagnosis not present

## 2015-01-16 DIAGNOSIS — Y990 Civilian activity done for income or pay: Secondary | ICD-10-CM | POA: Diagnosis not present

## 2015-01-16 DIAGNOSIS — F329 Major depressive disorder, single episode, unspecified: Secondary | ICD-10-CM | POA: Diagnosis not present

## 2015-01-16 DIAGNOSIS — N189 Chronic kidney disease, unspecified: Secondary | ICD-10-CM | POA: Insufficient documentation

## 2015-01-16 DIAGNOSIS — Z7982 Long term (current) use of aspirin: Secondary | ICD-10-CM | POA: Diagnosis not present

## 2015-01-16 MED ORDER — HYDROCODONE-ACETAMINOPHEN 5-325 MG PO TABS
2.0000 | ORAL_TABLET | Freq: Once | ORAL | Status: AC
  Administered 2015-01-16: 2 via ORAL
  Filled 2015-01-16: qty 2

## 2015-01-16 MED ORDER — HYDROCODONE-ACETAMINOPHEN 5-325 MG PO TABS: 1.0000 | ORAL_TABLET | ORAL | Status: DC | PRN

## 2015-01-16 NOTE — Discharge Instructions (Signed)
Take Vicodin as needed for pain. Follow up with the recommended Orthopedic doctor for further evaluation of your injury if symptoms do not resolve.

## 2015-01-16 NOTE — ED Notes (Signed)
Patient transported to X-ray 

## 2015-01-16 NOTE — ED Notes (Signed)
Pt made aware to return if symptoms worsen or if any life threatening symptoms occur.   

## 2015-01-16 NOTE — ED Provider Notes (Signed)
CSN: [id number]     Arrival date & time 01/16/15  1436 History  This chart was scribed for non-physician practitioner, Alvina Chou, PA-C, working with Leota Jacobsen, MD by Ladene Artist, ED Scribe. This patient was seen in room TR11C/TR11C and the patient's care was started at 3:03 PM.   Chief Complaint  Patient presents with  . Knee Pain   The history is provided by the patient. No language interpreter was used.   HPI Comments: Alan Tyler is a 59 y.o. male, with a h/o arthritis, hyperlipidemia, CKD, CA, depression, who presents to the Emergency Department complaining of constant L knee pain onset earlier today. Pt states that he was walking down stairs when he felt a "pop" in the back of his L knee. Pt describes the pain as a severe, sharp sensation. No previous injury to L knee. Pt states that he is unable to bear weight due to pain. He denies leg swelling or calf tenderness. No recent long trips.   Past Medical History  Diagnosis Date  . Arthritis   . Depression   . GERD (gastroesophageal reflux disease)   . PVC's (premature ventricular contractions)     arrhythmia  . Hyperlipidemia   . Chronic kidney disease     kidney stones and (R) nephrectomy  . Colon polyps   . Cancer     kidney - 2003   Past Surgical History  Procedure Laterality Date  . Nephrectomy  2003    (R) for cancer tumor  . Retinal detachment surgery  2012  . Cataract extraction      right eye   Family History  Problem Relation Age of Onset  . Arthritis Mother   . Depression Mother   . Colon cancer Mother     age 63 part of colon was removed  . Alcohol abuse Father   . Heart disease Father   . Hyperlipidemia Father   . Hyperlipidemia Maternal Grandmother   . Hyperlipidemia Maternal Grandfather   . Hyperlipidemia Paternal Grandmother   . Diabetes Paternal Grandmother   . Arthritis Paternal Grandfather   . Hyperlipidemia Paternal Grandfather   . Stomach cancer Neg Hx   . Esophageal cancer  Neg Hx   . Rectal cancer Neg Hx    History  Substance Use Topics  . Smoking status: Former Smoker    Quit date: 12/29/2001  . Smokeless tobacco: Former Systems developer    Quit date: 12/27/2002  . Alcohol Use: Yes     Comment: rarely    Review of Systems  Cardiovascular: Negative for leg swelling.  Musculoskeletal: Positive for arthralgias.  All other systems reviewed and are negative.  Allergies  Sulfa drugs cross reactors  Home Medications   Prior to Admission medications   Medication Sig Start Date End Date Taking? Authorizing Provider  ALPRAZolam Duanne Moron) 0.5 MG tablet TAKE 1 TABLET BY MOUTH DAILY AS NEEDED 10/17/14   Marletta Lor, MD  amphetamine-dextroamphetamine (ADDERALL XR) 20 MG 24 hr capsule Take 1 capsule (20 mg total) by mouth daily. 01/03/15   Marletta Lor, MD  amphetamine-dextroamphetamine (ADDERALL XR) 20 MG 24 hr capsule Take 1 capsule (20 mg total) by mouth daily. 01/03/15   Marletta Lor, MD  amphetamine-dextroamphetamine (ADDERALL XR) 20 MG 24 hr capsule Take 1 capsule (20 mg total) by mouth daily. 01/03/15   Marletta Lor, MD  amphetamine-dextroamphetamine (ADDERALL) 10 MG tablet Take 1 tablet (10 mg total) by mouth 2 (two) times daily. 01/03/15   Doretha Sou  Burnice Logan, MD  amphetamine-dextroamphetamine (ADDERALL) 10 MG tablet Take 1 tablet (10 mg total) by mouth 2 (two) times daily. 01/03/15   Marletta Lor, MD  amphetamine-dextroamphetamine (ADDERALL) 10 MG tablet Take 1 tablet (10 mg total) by mouth 2 (two) times daily. 01/03/15   Marletta Lor, MD  aspirin 81 MG tablet Take 81 mg by mouth daily.      Historical Provider, MD  buPROPion (WELLBUTRIN XL) 300 MG 24 hr tablet TAKE 1 TABLET EVERY MORNING 03/21/14   Marletta Lor, MD  ibuprofen (ADVIL,MOTRIN) 200 MG tablet Take 200 mg by mouth every 6 (six) hours as needed.      Historical Provider, MD  Omega-3 Fatty Acids (OMEGA 3 PO) Take by mouth. Take 3,300mg  (2 capsules) daily    Historical  Provider, MD  omeprazole (PRILOSEC) 20 MG capsule Take 1 capsule (20 mg total) by mouth daily. 09/08/13   Marletta Lor, MD  OVER THE COUNTER MEDICATION NATURAL PSYLIUM HUSK FIBER 0.52 grams,(5Capsules) daily    Historical Provider, MD   Triage Vitals: BP 143/96 mmHg  Pulse 84  Temp(Src) 97.8 F (36.6 C) (Oral)  Resp 20  SpO2 96% Physical Exam  Constitutional: He is oriented to person, place, and time. He appears well-developed and well-nourished. No distress.  HENT:  Head: Normocephalic and atraumatic.  Eyes: Conjunctivae and EOM are normal.  Neck: Normal range of motion. Neck supple.  Cardiovascular: Normal rate and intact distal pulses.   Pulmonary/Chest: Effort normal.  Abdominal: Soft. He exhibits no distension. There is no tenderness. There is no rebound.  Musculoskeletal: Normal range of motion.  Full ROM of L knee. No obvious joint deformity. Tenderness to palpation of L popliteal area. No swelling. No calf tenderness.   Neurological: He is alert and oriented to person, place, and time. Coordination normal.  Skin: Skin is warm and dry.  Psychiatric: He has a normal mood and affect. His behavior is normal.  Nursing note and vitals reviewed.  ED Course  Procedures (including critical care time) DIAGNOSTIC STUDIES: Oxygen Saturation is 96% on RA, adequate by my interpretation.    COORDINATION OF CARE: 3:06 PM-Discussed treatment plan which includes XR, Vicodin and follow-up with ortho with pt at bedside and pt agreed to plan.   SPLINT APPLICATION Date/Time: 6:64 PM Authorized by: Alvina Chou Consent: Verbal consent obtained. Risks and benefits: risks, benefits and alternatives were discussed Consent given by: patient Splint applied by: nurse Location details: left knee Splint type: knee immobilizer Supplies used: knee immobilizer Post-procedure: The splinted body part was neurovascularly unchanged following the procedure. Patient tolerance: Patient  tolerated the procedure well with no immediate complications.     Labs Review Labs Reviewed - No data to display  Imaging Review Dg Knee Complete 4 Views Left  01/16/2015   CLINICAL DATA:  Knee pain  EXAM: LEFT KNEE - COMPLETE 4+ VIEW  COMPARISON:  None.  FINDINGS: Negative for acute fracture. Medial and lateral joint spaces are normal. Irregularity of the posterior surface of the patella compatible with chondromalacia. Calcified loose bodies in the knee joint posteriorly. Possible small joint effusion.  IMPRESSION: Patellar chondromalacia.  Calcified loose bodies in the knee joint.   Electronically Signed   By: Franchot Gallo M.D.   On: 01/16/2015 15:20    EKG Interpretation None      MDM   Final diagnoses:  Left knee injury, initial encounter    3:39 PM Patient's xray unremarkable for acute changes. No neurovascular compromise. Patient will  have knee immobilizer and crutches. Patient instructed to follow up with Orthopedic doctor. No other injury.  I personally performed the services described in this documentation, which was scribed in my presence. The recorded information has been reviewed and is accurate.    Alvina Chou, PA-C 01/16/15 1543  Leota Jacobsen, MD 01/17/15 [id number]

## 2015-01-16 NOTE — ED Notes (Signed)
While walking down the steps, felt sudden "pop" in back of left knee. Now unable to bear weight.

## 2015-01-16 NOTE — ED Notes (Signed)
Pt reports today he was walking down the steps at work and felt left knee pop. Is able to bend knee, unable to bear wt.

## 2015-03-16 ENCOUNTER — Other Ambulatory Visit (INDEPENDENT_AMBULATORY_CARE_PROVIDER_SITE_OTHER): Payer: BLUE CROSS/BLUE SHIELD

## 2015-03-16 DIAGNOSIS — Z0000 Encounter for general adult medical examination without abnormal findings: Secondary | ICD-10-CM

## 2015-03-16 LAB — HEPATIC FUNCTION PANEL
ALT: 19 U/L (ref 0–53)
AST: 17 U/L (ref 0–37)
Albumin: 4.2 g/dL (ref 3.5–5.2)
Alkaline Phosphatase: 79 U/L (ref 39–117)
Bilirubin, Direct: 0.1 mg/dL (ref 0.0–0.3)
Total Bilirubin: 0.6 mg/dL (ref 0.2–1.2)
Total Protein: 6.8 g/dL (ref 6.0–8.3)

## 2015-03-16 LAB — BASIC METABOLIC PANEL
BUN: 17 mg/dL (ref 6–23)
CO2: 27 mEq/L (ref 19–32)
Calcium: 9.1 mg/dL (ref 8.4–10.5)
Chloride: 106 mEq/L (ref 96–112)
Creatinine, Ser: 1.39 mg/dL (ref 0.40–1.50)
GFR: 55.76 mL/min — ABNORMAL LOW (ref >60.00)
Glucose, Bld: 103 mg/dL — ABNORMAL HIGH (ref 70–99)
Potassium: 4.8 mEq/L (ref 3.5–5.1)
Sodium: 139 mEq/L (ref 135–145)

## 2015-03-16 LAB — LIPID PANEL
Cholesterol: 189 mg/dL (ref 0–200)
HDL: 43.2 mg/dL (ref >39.00)
LDL Cholesterol: 126 mg/dL — ABNORMAL HIGH (ref 0–99)
NonHDL: 145.8
Total CHOL/HDL Ratio: 4
Triglycerides: 101 mg/dL (ref 0.0–149.0)
VLDL: 20.2 mg/dL (ref 0.0–40.0)

## 2015-03-16 LAB — TSH: TSH: 1.91 u[IU]/mL (ref 0.35–4.50)

## 2015-03-16 LAB — CBC WITH DIFFERENTIAL/PLATELET
Basophils Absolute: 0 10*3/uL (ref 0.0–0.1)
Basophils Relative: 0.5 % (ref 0.0–3.0)
Eosinophils Absolute: 0.2 10*3/uL (ref 0.0–0.7)
Eosinophils Relative: 3.1 % (ref 0.0–5.0)
HCT: 44 % (ref 39.0–52.0)
Hemoglobin: 15.1 g/dL (ref 13.0–17.0)
Lymphocytes Relative: 23.9 % (ref 12.0–46.0)
Lymphs Abs: 1.8 10*3/uL (ref 0.7–4.0)
MCHC: 34.3 g/dL (ref 30.0–36.0)
MCV: 88.5 fl (ref 78.0–100.0)
Monocytes Absolute: 0.5 10*3/uL (ref 0.1–1.0)
Monocytes Relative: 6.5 % (ref 3.0–12.0)
Neutro Abs: 5.1 10*3/uL (ref 1.4–7.7)
Neutrophils Relative %: 66 % (ref 43.0–77.0)
Platelets: 214 10*3/uL (ref 150.0–400.0)
RBC: 4.98 Mil/uL (ref 4.22–5.81)
RDW: 13.1 % (ref 11.5–15.5)
WBC: 7.7 10*3/uL (ref 4.0–10.5)

## 2015-03-16 LAB — POCT URINALYSIS DIPSTICK
Bilirubin, UA: NEGATIVE
Blood, UA: NEGATIVE
Glucose, UA: NEGATIVE
Ketones, UA: NEGATIVE
Leukocytes, UA: NEGATIVE
Nitrite, UA: NEGATIVE
Protein, UA: NEGATIVE
Spec Grav, UA: 1.02
Urobilinogen, UA: 0.2
pH, UA: 5

## 2015-03-16 LAB — PSA: PSA: 2.13 ng/mL (ref 0.10–4.00)

## 2015-03-23 ENCOUNTER — Encounter: Payer: BC Managed Care – PPO | Admitting: Internal Medicine

## 2015-03-27 ENCOUNTER — Encounter: Payer: Self-pay | Admitting: Internal Medicine

## 2015-03-27 ENCOUNTER — Ambulatory Visit (INDEPENDENT_AMBULATORY_CARE_PROVIDER_SITE_OTHER): Payer: BLUE CROSS/BLUE SHIELD | Admitting: Internal Medicine

## 2015-03-27 VITALS — BP 110/78 | HR 106 | Temp 98.2°F | Resp 20 | Ht 66.5 in | Wt 250.0 lb

## 2015-03-27 DIAGNOSIS — Z0000 Encounter for general adult medical examination without abnormal findings: Secondary | ICD-10-CM

## 2015-03-27 DIAGNOSIS — F909 Attention-deficit hyperactivity disorder, unspecified type: Secondary | ICD-10-CM | POA: Diagnosis not present

## 2015-03-27 DIAGNOSIS — Z85528 Personal history of other malignant neoplasm of kidney: Secondary | ICD-10-CM

## 2015-03-27 DIAGNOSIS — F988 Other specified behavioral and emotional disorders with onset usually occurring in childhood and adolescence: Secondary | ICD-10-CM

## 2015-03-27 MED ORDER — ALPRAZOLAM 0.5 MG PO TABS: 0.5000 mg | ORAL_TABLET | Freq: Every day | ORAL | Status: DC | PRN

## 2015-03-27 MED ORDER — AMPHETAMINE-DEXTROAMPHETAMINE 10 MG PO TABS: 10.0000 mg | ORAL_TABLET | Freq: Two times a day (BID) | ORAL | Status: DC

## 2015-03-27 MED ORDER — AMPHETAMINE-DEXTROAMPHET ER 20 MG PO CP24: 20.0000 mg | ORAL_CAPSULE | Freq: Every day | ORAL | Status: DC

## 2015-03-27 MED ORDER — OMEPRAZOLE 20 MG PO CPDR: 20.0000 mg | DELAYED_RELEASE_CAPSULE | Freq: Every day | ORAL | Status: DC

## 2015-03-27 MED ORDER — BUPROPION HCL ER (XL) 300 MG PO TB24: ORAL_TABLET | ORAL | Status: DC

## 2015-03-27 NOTE — Progress Notes (Signed)
Pre visit review using our clinic review tool, if applicable. No additional management support is needed unless otherwise documented below in the visit note. 

## 2015-03-27 NOTE — Patient Instructions (Signed)
It is important that you exercise regularly, at least 20 minutes 3 to 4 times per week.  If you develop chest pain or shortness of breath seek  medical attention.  You need to lose weight.  Consider a lower calorie diet and regular exercise.  Return in 6 months for follow-up  

## 2015-03-27 NOTE — Progress Notes (Signed)
Subjective:    Patient ID: Alan Tyler, male    DOB: 10/11/1956, 59 y.o.   MRN: [id number]  HPI   59 year old patient who is in today for a wellness exam.  He has a history of benign PVCs and was seen by cardiology in 2014 and had a negative stress echocardiogram at that time.  He does have a history of anxiety, depression, and has done well on bupropion.  He increased this medication to 300 mg  and has done much better.  He did have colonoscopy in 2014.  He does have a history of colonic polyps He also has a history of ADHD and has had a dramatic response with Adderall and quite pleased with the progress.  Unfortunately, compared to his last visit he has had significant weight gain   Wt Readings from Last 3 Encounters:  03/27/15 250 lb (113.399 kg)  07/31/14 229 lb (103.874 kg)  04/28/14 232 lb (105.235 kg)   Past Medical History  Diagnosis Date  . Arthritis   . Depression   . GERD (gastroesophageal reflux disease)   . PVC's (premature ventricular contractions)     arrhythmia  . Hyperlipidemia   . Chronic kidney disease     kidney stones and (R) nephrectomy  . Colon polyps   . Cancer     kidney - 2003    History   Social History  . Marital Status: Married    Spouse Name: N/A  . Number of Children: 3  . Years of Education: N/A   Occupational History  . manufacturing-unemployed    Social History Main Topics  . Smoking status: Former Smoker    Quit date: 12/29/2001  . Smokeless tobacco: Former Systems developer    Quit date: 12/27/2002  . Alcohol Use: Yes     Comment: rarely  . Drug Use: No  . Sexual Activity: Not on file   Other Topics Concern  . Not on file   Social History Narrative    Past Surgical History  Procedure Laterality Date  . Nephrectomy  2003    (R) for cancer tumor  . Retinal detachment surgery  2012  . Cataract extraction      right eye    Family History  Problem Relation Age of Onset  . Arthritis Mother   . Depression Mother   . Colon cancer  Mother     age 60 part of colon was removed  . Alcohol abuse Father   . Heart disease Father   . Hyperlipidemia Father   . Hyperlipidemia Maternal Grandmother   . Hyperlipidemia Maternal Grandfather   . Hyperlipidemia Paternal Grandmother   . Diabetes Paternal Grandmother   . Arthritis Paternal Grandfather   . Hyperlipidemia Paternal Grandfather   . Stomach cancer Neg Hx   . Esophageal cancer Neg Hx   . Rectal cancer Neg Hx     Allergies  Allergen Reactions  . Sulfa Drugs Cross Reactors     Per pt: unknown    Current Outpatient Prescriptions on File Prior to Visit  Medication Sig Dispense Refill  . ALPRAZolam (XANAX) 0.5 MG tablet TAKE 1 TABLET BY MOUTH DAILY AS NEEDED 60 tablet 2  . amphetamine-dextroamphetamine (ADDERALL XR) 20 MG 24 hr capsule Take 1 capsule (20 mg total) by mouth daily. 30 capsule 0  . amphetamine-dextroamphetamine (ADDERALL XR) 20 MG 24 hr capsule Take 1 capsule (20 mg total) by mouth daily. 30 capsule 0  . amphetamine-dextroamphetamine (ADDERALL XR) 20 MG 24 hr capsule Take 1  capsule (20 mg total) by mouth daily. 30 capsule 0  . amphetamine-dextroamphetamine (ADDERALL) 10 MG tablet Take 1 tablet (10 mg total) by mouth 2 (two) times daily. 60 tablet 0  . amphetamine-dextroamphetamine (ADDERALL) 10 MG tablet Take 1 tablet (10 mg total) by mouth 2 (two) times daily. 60 tablet 0  . amphetamine-dextroamphetamine (ADDERALL) 10 MG tablet Take 1 tablet (10 mg total) by mouth 2 (two) times daily. 60 tablet 0  . aspirin 81 MG tablet Take 81 mg by mouth daily.      Marland Kitchen buPROPion (WELLBUTRIN XL) 300 MG 24 hr tablet TAKE 1 TABLET EVERY MORNING 90 tablet 4  . ibuprofen (ADVIL,MOTRIN) 200 MG tablet Take 200 mg by mouth every 6 (six) hours as needed.      . Omega-3 Fatty Acids (OMEGA 3 PO) Take by mouth. Take 3,300mg  (2 capsules) daily    . omeprazole (PRILOSEC) 20 MG capsule Take 1 capsule (20 mg total) by mouth daily. 90 capsule 4  . OVER THE COUNTER MEDICATION NATURAL  PSYLIUM HUSK FIBER 0.52 grams,(5Capsules) daily     No current facility-administered medications on file prior to visit.    BP 110/78 mmHg  Pulse 106  Temp(Src) 98.2 F (36.8 C) (Oral)  Resp 20  Ht 5' 6.5" (1.689 m)  Wt 250 lb (113.399 kg)  BMI 39.75 kg/m2  SpO2 97%       Review of Systems  Constitutional: Negative for fever, chills, activity change, appetite change and fatigue.  HENT: Negative for congestion, dental problem, ear pain, hearing loss, mouth sores, rhinorrhea, sinus pressure, sneezing, tinnitus, trouble swallowing and voice change.   Eyes: Negative for photophobia, pain, redness and visual disturbance.  Respiratory: Negative for apnea, cough, choking, chest tightness, shortness of breath and wheezing.   Cardiovascular: Negative for chest pain, palpitations and leg swelling.  Gastrointestinal: Negative for nausea, vomiting, abdominal pain, diarrhea, constipation, blood in stool, abdominal distention, anal bleeding and rectal pain.  Genitourinary: Negative for dysuria, urgency, frequency, hematuria, flank pain, decreased urine volume, discharge, penile swelling, scrotal swelling, difficulty urinating, genital sores and testicular pain.  Musculoskeletal: Negative for myalgias, back pain, joint swelling, arthralgias, gait problem, neck pain and neck stiffness.  Skin: Negative for color change, rash and wound.  Neurological: Negative for dizziness, tremors, seizures, syncope, facial asymmetry, speech difficulty, weakness, light-headedness, numbness and headaches.  Hematological: Negative for adenopathy. Does not bruise/bleed easily.  Psychiatric/Behavioral: Negative for suicidal ideas, hallucinations, behavioral problems, confusion, sleep disturbance, self-injury, dysphoric mood, decreased concentration and agitation. The patient is not nervous/anxious.        Objective:   Physical Exam  Constitutional: He appears well-developed and well-nourished.  Obese Blood  pressure low normal  HENT:  Head: Normocephalic and atraumatic.  Right Ear: External ear normal.  Left Ear: External ear normal.  Nose: Nose normal.  Mouth/Throat: Oropharynx is clear and moist.  Eyes: Conjunctivae and EOM are normal. Pupils are equal, round, and reactive to light. No scleral icterus.  Neck: Normal range of motion. Neck supple. No JVD present. No thyromegaly present.  Cardiovascular: Regular rhythm, normal heart sounds and intact distal pulses.  Exam reveals no gallop and no friction rub.   No murmur heard. Pulmonary/Chest: Effort normal and breath sounds normal. He exhibits no tenderness.  Abdominal: Soft. Bowel sounds are normal. He exhibits no distension and no mass. There is no tenderness.  Lower midline scar  Genitourinary: Prostate normal and penis normal. Guaiac negative stool.  Prostate minimally symmetrically enlarged  Musculoskeletal: Normal range  of motion. He exhibits no edema or tenderness.  Lymphadenopathy:    He has no cervical adenopathy.  Neurological: He is alert. He has normal reflexes. No cranial nerve deficit. Coordination normal.  Skin: Skin is warm and dry. No rash noted.  Psychiatric: He has a normal mood and affect. His behavior is normal.          Assessment & Plan:   Preventive health examination Benign PVCs Personal history colonic polyps.  Continue five-year surveillance intervals History of depression, stable and improved on increased dose of bupropion.  We'll continue present dose Status post nephrectomy for renal cancer ADHD.  Well controlled on Adderall.  Will continue.  Recheck 6 months

## 2015-04-24 ENCOUNTER — Telehealth: Payer: Self-pay | Admitting: Internal Medicine

## 2015-04-24 MED ORDER — BUPROPION HCL ER (XL) 300 MG PO TB24: ORAL_TABLET | ORAL | Status: DC

## 2015-04-24 NOTE — Telephone Encounter (Signed)
Pt notified Rx sent to pharmacy

## 2015-04-24 NOTE — Telephone Encounter (Signed)
Pt request refill of the following: buPROPion (WELLBUTRIN XL) 300 MG 24 hr tablet   Phamacy: [facility]

## 2015-07-05 ENCOUNTER — Telehealth: Payer: Self-pay | Admitting: Internal Medicine

## 2015-07-05 MED ORDER — AMPHETAMINE-DEXTROAMPHET ER 20 MG PO CP24: 20.0000 mg | ORAL_CAPSULE | Freq: Every day | ORAL | Status: DC

## 2015-07-05 MED ORDER — AMPHETAMINE-DEXTROAMPHETAMINE 10 MG PO TABS: 10.0000 mg | ORAL_TABLET | Freq: Two times a day (BID) | ORAL | Status: DC

## 2015-07-05 NOTE — Telephone Encounter (Signed)
° ° ° °

## 2015-07-05 NOTE — Telephone Encounter (Signed)
Pt notified Rx ready for pickup. Rx printed and signed.  

## 2015-09-21 ENCOUNTER — Other Ambulatory Visit: Payer: Self-pay | Admitting: *Deleted

## 2015-09-21 MED ORDER — BUPROPION HCL ER (XL) 300 MG PO TB24: ORAL_TABLET | ORAL | Status: DC

## 2015-09-27 ENCOUNTER — Encounter: Payer: Self-pay | Admitting: Internal Medicine

## 2015-09-27 ENCOUNTER — Ambulatory Visit (INDEPENDENT_AMBULATORY_CARE_PROVIDER_SITE_OTHER): Payer: BLUE CROSS/BLUE SHIELD | Admitting: Internal Medicine

## 2015-09-27 VITALS — BP 138/86 | HR 92 | Temp 98.3°F | Resp 20 | Ht 66.5 in | Wt 252.0 lb

## 2015-09-27 DIAGNOSIS — F32A Depression, unspecified: Secondary | ICD-10-CM

## 2015-09-27 DIAGNOSIS — F988 Other specified behavioral and emotional disorders with onset usually occurring in childhood and adolescence: Secondary | ICD-10-CM

## 2015-09-27 DIAGNOSIS — Z23 Encounter for immunization: Secondary | ICD-10-CM

## 2015-09-27 DIAGNOSIS — F329 Major depressive disorder, single episode, unspecified: Secondary | ICD-10-CM

## 2015-09-27 DIAGNOSIS — F909 Attention-deficit hyperactivity disorder, unspecified type: Secondary | ICD-10-CM

## 2015-09-27 MED ORDER — AMPHETAMINE-DEXTROAMPHETAMINE 10 MG PO TABS: 10.0000 mg | ORAL_TABLET | Freq: Two times a day (BID) | ORAL | Status: DC

## 2015-09-27 MED ORDER — AMPHETAMINE-DEXTROAMPHET ER 20 MG PO CP24: 20.0000 mg | ORAL_CAPSULE | Freq: Every day | ORAL | Status: DC

## 2015-09-27 NOTE — Patient Instructions (Signed)
It is important that you exercise regularly, at least 20 minutes 3 to 4 times per week.  If you develop chest pain or shortness of breath seek  medical attention.  You need to lose weight.  Consider a lower calorie diet and regular exercise.  Return in 6 months for follow-up  

## 2015-09-27 NOTE — Progress Notes (Signed)
Pre visit review using our clinic review tool, if applicable. No additional management support is needed unless otherwise documented below in the visit note. 

## 2015-09-27 NOTE — Progress Notes (Signed)
Subjective:    Patient ID: Alan Tyler, male    DOB: Jun 06, 1956, 59 y.o.   MRN: [id number]  HPI  59 year old patient who has history depression and ADD.  He is seen today for his biannual follow-up.  He is doing quite well without concerns or complaints.  He is quite pleased with the results of both Adderall and Wellbutrin Unfortunately, no weight loss over the past 6 months  Past Medical History  Diagnosis Date  . Arthritis   . Depression   . GERD (gastroesophageal reflux disease)   . PVC's (premature ventricular contractions)     arrhythmia  . Hyperlipidemia   . Chronic kidney disease     kidney stones and (R) nephrectomy  . Colon polyps   . Cancer     kidney - 2003    Social History   Social History  . Marital Status: Married    Spouse Name: N/A  . Number of Children: 3  . Years of Education: N/A   Occupational History  . manufacturing-unemployed    Social History Main Topics  . Smoking status: Former Smoker    Quit date: 12/29/2001  . Smokeless tobacco: Former Systems developer    Quit date: 12/27/2002  . Alcohol Use: Yes     Comment: rarely  . Drug Use: No  . Sexual Activity: Not on file   Other Topics Concern  . Not on file   Social History Narrative    Past Surgical History  Procedure Laterality Date  . Nephrectomy  2003    (R) for cancer tumor  . Retinal detachment surgery  2012  . Cataract extraction      right eye    Family History  Problem Relation Age of Onset  . Arthritis Mother   . Depression Mother   . Colon cancer Mother     age 47 part of colon was removed  . Alcohol abuse Father   . Heart disease Father   . Hyperlipidemia Father   . Hyperlipidemia Maternal Grandmother   . Hyperlipidemia Maternal Grandfather   . Hyperlipidemia Paternal Grandmother   . Diabetes Paternal Grandmother   . Arthritis Paternal Grandfather   . Hyperlipidemia Paternal Grandfather   . Stomach cancer Neg Hx   . Esophageal cancer Neg Hx   . Rectal cancer Neg Hx      Allergies  Allergen Reactions  . Sulfa Drugs Cross Reactors     Per pt: unknown    Current Outpatient Prescriptions on File Prior to Visit  Medication Sig Dispense Refill  . ALPRAZolam (XANAX) 0.5 MG tablet Take 1 tablet (0.5 mg total) by mouth daily as needed. 60 tablet 5  . aspirin 81 MG tablet Take 81 mg by mouth daily.      Marland Kitchen buPROPion (WELLBUTRIN XL) 300 MG 24 hr tablet TAKE 1 TABLET EVERY MORNING 90 tablet 1  . ibuprofen (ADVIL,MOTRIN) 200 MG tablet Take 200 mg by mouth every 6 (six) hours as needed.      . Omega-3 Fatty Acids (OMEGA 3 PO) Take by mouth. Take 3,300mg  (2 capsules) daily    . omeprazole (PRILOSEC) 20 MG capsule Take 1 capsule (20 mg total) by mouth daily. 90 capsule 3  . OVER THE COUNTER MEDICATION NATURAL PSYLIUM HUSK FIBER 0.52 grams,(5Capsules) daily     No current facility-administered medications on file prior to visit.    BP 138/86 mmHg  Pulse 92  Temp(Src) 98.3 F (36.8 C) (Oral)  Resp 20  Ht 5' 6.5" (1.689 m)  Wt 252 lb (114.306 kg)  BMI 40.07 kg/m2  SpO2 97%     Review of Systems  Constitutional: Negative for fever, chills, appetite change and fatigue.  HENT: Negative for congestion, dental problem, ear pain, hearing loss, sore throat, tinnitus, trouble swallowing and voice change.   Eyes: Negative for pain, discharge and visual disturbance.  Respiratory: Negative for cough, chest tightness, wheezing and stridor.   Cardiovascular: Negative for chest pain, palpitations and leg swelling.  Gastrointestinal: Negative for nausea, vomiting, abdominal pain, diarrhea, constipation, blood in stool and abdominal distention.  Genitourinary: Negative for urgency, hematuria, flank pain, discharge, difficulty urinating and genital sores.  Musculoskeletal: Negative for myalgias, back pain, joint swelling, arthralgias, gait problem and neck stiffness.  Skin: Negative for rash.  Neurological: Negative for dizziness, syncope, speech difficulty, weakness,  numbness and headaches.  Hematological: Negative for adenopathy. Does not bruise/bleed easily.  Psychiatric/Behavioral: Negative for behavioral problems and dysphoric mood. The patient is not nervous/anxious.        Objective:   Physical Exam  Constitutional: He is oriented to person, place, and time. He appears well-developed.  Overweight Blood pressure 124/82  HENT:  Head: Normocephalic.  Right Ear: External ear normal.  Left Ear: External ear normal.  Eyes: Conjunctivae and EOM are normal.  Neck: Normal range of motion.  Cardiovascular: Normal rate and normal heart sounds.   Pulmonary/Chest: Breath sounds normal.  Abdominal: Bowel sounds are normal.  Musculoskeletal: Normal range of motion. He exhibits no edema or tenderness.  Neurological: He is alert and oriented to person, place, and time.  Psychiatric: He has a normal mood and affect. His behavior is normal.          Assessment & Plan:   Obesity.  Exercise weight loss encouraged Depression, stable ADD.  Doing quite well on present regimen.  No side effect issues  CPX 6 months

## 2016-01-02 ENCOUNTER — Telehealth: Payer: Self-pay | Admitting: Internal Medicine

## 2016-01-02 MED ORDER — AMPHETAMINE-DEXTROAMPHET ER 20 MG PO CP24: 20.0000 mg | ORAL_CAPSULE | Freq: Every day | ORAL | Status: DC

## 2016-01-02 MED ORDER — AMPHETAMINE-DEXTROAMPHET ER 20 MG PO CP24
20.0000 mg | ORAL_CAPSULE | Freq: Every day | ORAL | Status: DC
Start: 2016-01-02 — End: 2016-03-28

## 2016-01-02 NOTE — Telephone Encounter (Signed)
Pt needs new rx generic adderall xr 20 mg °

## 2016-01-02 NOTE — Telephone Encounter (Signed)
Pt's wife Manuela Schwartz called back and said pt only needs 20 mg Rx's. Told Manuela Schwartz okay Rx's will be ready today will be at the front desk. Manuela Schwartz verbalized understanding. Rx's printed and signed.

## 2016-01-02 NOTE — Telephone Encounter (Signed)
Left message on voicemail to call office.  

## 2016-02-13 ENCOUNTER — Other Ambulatory Visit: Payer: Self-pay | Admitting: Internal Medicine

## 2016-03-05 ENCOUNTER — Encounter: Payer: Self-pay | Admitting: Internal Medicine

## 2016-03-05 ENCOUNTER — Ambulatory Visit (INDEPENDENT_AMBULATORY_CARE_PROVIDER_SITE_OTHER): Payer: BLUE CROSS/BLUE SHIELD | Admitting: Internal Medicine

## 2016-03-05 VITALS — BP 138/90 | HR 107 | Temp 98.9°F | Resp 20 | Ht 66.5 in | Wt 253.0 lb

## 2016-03-05 DIAGNOSIS — J441 Chronic obstructive pulmonary disease with (acute) exacerbation: Secondary | ICD-10-CM | POA: Diagnosis not present

## 2016-03-05 MED ORDER — AZITHROMYCIN 250 MG PO TABS: ORAL_TABLET | ORAL | Status: DC

## 2016-03-05 MED ORDER — HYDROCODONE-HOMATROPINE 5-1.5 MG/5ML PO SYRP: 5.0000 mL | ORAL_SOLUTION | Freq: Four times a day (QID) | ORAL | Status: DC | PRN

## 2016-03-05 NOTE — Progress Notes (Signed)
Pre visit review using our clinic review tool, if applicable. No additional management support is needed unless otherwise documented below in the visit note. 

## 2016-03-05 NOTE — Patient Instructions (Signed)
Acute bronchitis symptoms for less than 10 days are generally not helped by antibiotics.  Take over-the-counter expectorants and cough medications such as  Mucinex DM.  Call if there is no improvement in 5 to 7 days or if  you develop worsening cough, fever, or new symptoms, such as shortness of breath or chest pain.   

## 2016-03-05 NOTE — Progress Notes (Signed)
Subjective:    Patient ID: Alan Tyler, male    DOB: 1956/12/25, 60 y.o.   MRN: [id number]  HPI   60 year old patient who is a former tobacco history and discontinued heavy use in 2003.  He has been ill for the past 3 weeks with cough, congestion, chills and fatigue.  Cough is been mildly productive of brown sputum.  Cough is incessant and interferes with sleep.  No active wheezing.  Past Medical History  Diagnosis Date  . Arthritis   . Depression   . GERD (gastroesophageal reflux disease)   . PVC's (premature ventricular contractions)     arrhythmia  . Hyperlipidemia   . Chronic kidney disease     kidney stones and (R) nephrectomy  . Colon polyps   . Cancer [facility])     kidney - 2003    Social History   Social History  . Marital Status: Married    Spouse Name: N/A  . Number of Children: 3  . Years of Education: N/A   Occupational History  . manufacturing-unemployed    Social History Main Topics  . Smoking status: Former Smoker    Quit date: 12/29/2001  . Smokeless tobacco: Former Systems developer    Quit date: 12/27/2002  . Alcohol Use: Yes     Comment: rarely  . Drug Use: No  . Sexual Activity: Not on file   Other Topics Concern  . Not on file   Social History Narrative    Past Surgical History  Procedure Laterality Date  . Nephrectomy  2003    (R) for cancer tumor  . Retinal detachment surgery  2012  . Cataract extraction      right eye    Family History  Problem Relation Age of Onset  . Arthritis Mother   . Depression Mother   . Colon cancer Mother     age 18 part of colon was removed  . Alcohol abuse Father   . Heart disease Father   . Hyperlipidemia Father   . Hyperlipidemia Maternal Grandmother   . Hyperlipidemia Maternal Grandfather   . Hyperlipidemia Paternal Grandmother   . Diabetes Paternal Grandmother   . Arthritis Paternal Grandfather   . Hyperlipidemia Paternal Grandfather   . Stomach cancer Neg Hx   . Esophageal cancer Neg Hx   . Rectal  cancer Neg Hx     Allergies  Allergen Reactions  . Sulfa Drugs Cross Reactors     Per pt: unknown    Current Outpatient Prescriptions on File Prior to Visit  Medication Sig Dispense Refill  . ALPRAZolam (XANAX) 0.5 MG tablet Take 1 tablet (0.5 mg total) by mouth daily as needed. 60 tablet 5  . amphetamine-dextroamphetamine (ADDERALL XR) 20 MG 24 hr capsule Take 1 capsule (20 mg total) by mouth daily. 30 capsule 0  . amphetamine-dextroamphetamine (ADDERALL XR) 20 MG 24 hr capsule Take 1 capsule (20 mg total) by mouth daily. 30 capsule 0  . amphetamine-dextroamphetamine (ADDERALL XR) 20 MG 24 hr capsule Take 1 capsule (20 mg total) by mouth daily. 30 capsule 0  . amphetamine-dextroamphetamine (ADDERALL) 10 MG tablet Take 1 tablet (10 mg total) by mouth 2 (two) times daily. 60 tablet 0  . amphetamine-dextroamphetamine (ADDERALL) 10 MG tablet Take 1 tablet (10 mg total) by mouth 2 (two) times daily. 60 tablet 0  . amphetamine-dextroamphetamine (ADDERALL) 10 MG tablet Take 1 tablet (10 mg total) by mouth 2 (two) times daily. 60 tablet 0  . aspirin 81 MG tablet Take 81  mg by mouth daily.      Marland Kitchen buPROPion (WELLBUTRIN XL) 300 MG 24 hr tablet TAKE 1 TABLET EVERY MORNING 90 tablet 1  . ibuprofen (ADVIL,MOTRIN) 200 MG tablet Take 200 mg by mouth every 6 (six) hours as needed.      Marland Kitchen ketoconazole (NIZORAL) 2 % shampoo APP EXT 3 TIMES A WEEK UTD  1  . ketorolac (ACULAR) 0.5 % ophthalmic solution INSTILL 1 DROP INTO THE LEFT EYE QID  2  . Omega-3 Fatty Acids (OMEGA 3 PO) Take by mouth. Take 3,300mg  (2 capsules) daily    . OVER THE COUNTER MEDICATION NATURAL PSYLIUM HUSK FIBER 0.52 grams,(5Capsules) daily     No current facility-administered medications on file prior to visit.    BP 138/90 mmHg  Pulse 107  Temp(Src) 98.9 F (37.2 C) (Oral)  Resp 20  Ht 5' 6.5" (1.689 m)  Wt 253 lb (114.76 kg)  BMI 40.23 kg/m2  SpO2 98%     Review of Systems  Constitutional: Positive for chills, activity  change, appetite change and fatigue. Negative for fever.  HENT: Positive for congestion. Negative for dental problem, ear pain, hearing loss, sore throat, tinnitus, trouble swallowing and voice change.   Eyes: Negative for pain, discharge and visual disturbance.  Respiratory: Positive for cough. Negative for chest tightness, wheezing and stridor.   Cardiovascular: Negative for chest pain, palpitations and leg swelling.  Gastrointestinal: Negative for nausea, vomiting, abdominal pain, diarrhea, constipation, blood in stool and abdominal distention.  Genitourinary: Negative for urgency, hematuria, flank pain, discharge, difficulty urinating and genital sores.  Musculoskeletal: Negative for myalgias, back pain, joint swelling, arthralgias, gait problem and neck stiffness.  Skin: Negative for rash.  Neurological: Negative for dizziness, syncope, speech difficulty, weakness, numbness and headaches.  Hematological: Negative for adenopathy. Does not bruise/bleed easily.  Psychiatric/Behavioral: Negative for behavioral problems and dysphoric mood. The patient is not nervous/anxious.        Objective:   Physical Exam  Constitutional: He is oriented to person, place, and time. He appears well-developed.  Temperature 98.9 Pulse 100  HENT:  Head: Normocephalic.  Right Ear: External ear normal.  Left Ear: External ear normal.  Eyes: Conjunctivae and EOM are normal.  Neck: Normal range of motion.  Cardiovascular: Normal rate and normal heart sounds.   Pulmonary/Chest: Breath sounds normal.  O2 saturation 98%  Coarse diffuse rhonchi  Abdominal: Bowel sounds are normal.  Musculoskeletal: Normal range of motion. He exhibits no edema or tenderness.  Neurological: He is alert and oriented to person, place, and time.  Psychiatric: He has a normal mood and affect. His behavior is normal.          Assessment & Plan:   COPD exacerbation.  We'll treat with expectorants.  Fluids.  Azithromycin. He  is scheduled for a physical later this month for reassess at that time He will report any clinical worsening

## 2016-03-21 ENCOUNTER — Other Ambulatory Visit (INDEPENDENT_AMBULATORY_CARE_PROVIDER_SITE_OTHER): Payer: BLUE CROSS/BLUE SHIELD

## 2016-03-21 DIAGNOSIS — Z0000 Encounter for general adult medical examination without abnormal findings: Secondary | ICD-10-CM | POA: Diagnosis not present

## 2016-03-21 LAB — CBC WITH DIFFERENTIAL/PLATELET
Basophils Absolute: 0 10*3/uL (ref 0.0–0.1)
Basophils Relative: 0.4 % (ref 0.0–3.0)
Eosinophils Absolute: 0.5 10*3/uL (ref 0.0–0.7)
Eosinophils Relative: 4.7 % (ref 0.0–5.0)
HCT: 44.3 % (ref 39.0–52.0)
Hemoglobin: 15.1 g/dL (ref 13.0–17.0)
Lymphocytes Relative: 21 % (ref 12.0–46.0)
Lymphs Abs: 2.1 10*3/uL (ref 0.7–4.0)
MCHC: 34.2 g/dL (ref 30.0–36.0)
MCV: 88.9 fl (ref 78.0–100.0)
Monocytes Absolute: 0.6 10*3/uL (ref 0.1–1.0)
Monocytes Relative: 5.8 % (ref 3.0–12.0)
Neutro Abs: 6.7 10*3/uL (ref 1.4–7.7)
Neutrophils Relative %: 68.1 % (ref 43.0–77.0)
Platelets: 220 10*3/uL (ref 150.0–400.0)
RBC: 4.98 Mil/uL (ref 4.22–5.81)
RDW: 13.8 % (ref 11.5–15.5)
WBC: 9.8 10*3/uL (ref 4.0–10.5)

## 2016-03-21 LAB — BASIC METABOLIC PANEL
BUN: 18 mg/dL (ref 6–23)
CO2: 27 mEq/L (ref 19–32)
Calcium: 9.1 mg/dL (ref 8.4–10.5)
Chloride: 103 mEq/L (ref 96–112)
Creatinine, Ser: 1.32 mg/dL (ref 0.40–1.50)
GFR: 58.97 mL/min — ABNORMAL LOW (ref >60.00)
Glucose, Bld: 105 mg/dL — ABNORMAL HIGH (ref 70–99)
Potassium: 4.3 mEq/L (ref 3.5–5.1)
Sodium: 138 mEq/L (ref 135–145)

## 2016-03-21 LAB — POC URINALSYSI DIPSTICK (AUTOMATED)
Bilirubin, UA: NEGATIVE
Blood, UA: NEGATIVE
Glucose, UA: NEGATIVE
Ketones, UA: NEGATIVE
Leukocytes, UA: NEGATIVE
Nitrite, UA: NEGATIVE
Protein, UA: NEGATIVE
Spec Grav, UA: 1.02
Urobilinogen, UA: 0.2
pH, UA: 5.5

## 2016-03-21 LAB — HEPATIC FUNCTION PANEL
ALT: 24 U/L (ref 0–53)
AST: 19 U/L (ref 0–37)
Albumin: 4 g/dL (ref 3.5–5.2)
Alkaline Phosphatase: 69 U/L (ref 39–117)
Bilirubin, Direct: 0.1 mg/dL (ref 0.0–0.3)
Total Bilirubin: 0.7 mg/dL (ref 0.2–1.2)
Total Protein: 6.9 g/dL (ref 6.0–8.3)

## 2016-03-21 LAB — LIPID PANEL
Cholesterol: 209 mg/dL — ABNORMAL HIGH (ref 0–200)
HDL: 40.7 mg/dL (ref >39.00)
LDL Cholesterol: 134 mg/dL — ABNORMAL HIGH (ref 0–99)
NonHDL: 168.08
Total CHOL/HDL Ratio: 5
Triglycerides: 172 mg/dL — ABNORMAL HIGH (ref 0.0–149.0)
VLDL: 34.4 mg/dL (ref 0.0–40.0)

## 2016-03-21 LAB — PSA: PSA: 2.19 ng/mL (ref 0.10–4.00)

## 2016-03-21 LAB — TSH: TSH: 3.4 u[IU]/mL (ref 0.35–4.50)

## 2016-03-28 ENCOUNTER — Ambulatory Visit (INDEPENDENT_AMBULATORY_CARE_PROVIDER_SITE_OTHER): Payer: BLUE CROSS/BLUE SHIELD | Admitting: Internal Medicine

## 2016-03-28 ENCOUNTER — Encounter: Payer: Self-pay | Admitting: Internal Medicine

## 2016-03-28 VITALS — BP 150/100 | HR 99 | Temp 98.5°F | Resp 20 | Ht 66.75 in | Wt 256.0 lb

## 2016-03-28 DIAGNOSIS — F909 Attention-deficit hyperactivity disorder, unspecified type: Secondary | ICD-10-CM | POA: Diagnosis not present

## 2016-03-28 DIAGNOSIS — Z86010 Personal history of colonic polyps: Secondary | ICD-10-CM

## 2016-03-28 DIAGNOSIS — Z0000 Encounter for general adult medical examination without abnormal findings: Secondary | ICD-10-CM

## 2016-03-28 DIAGNOSIS — F988 Other specified behavioral and emotional disorders with onset usually occurring in childhood and adolescence: Secondary | ICD-10-CM

## 2016-03-28 MED ORDER — AMPHETAMINE-DEXTROAMPHET ER 20 MG PO CP24: 20.0000 mg | ORAL_CAPSULE | Freq: Every day | ORAL | Status: DC

## 2016-03-28 MED ORDER — ALPRAZOLAM 0.5 MG PO TABS: 0.5000 mg | ORAL_TABLET | Freq: Every day | ORAL | Status: DC | PRN

## 2016-03-28 MED ORDER — AMPHETAMINE-DEXTROAMPHETAMINE 10 MG PO TABS: 10.0000 mg | ORAL_TABLET | Freq: Two times a day (BID) | ORAL | Status: DC

## 2016-03-28 NOTE — Progress Notes (Signed)
Pre visit review using our clinic review tool, if applicable. No additional management support is needed unless otherwise documented below in the visit note. 

## 2016-03-28 NOTE — Patient Instructions (Signed)
Limit your sodium (Salt) intake  Please check your blood pressure on a regular basis.  If it is consistently greater than 150/90, please make an office appointment.    It is important that you exercise regularly, at least 20 minutes 3 to 4 times per week.  If you develop chest pain or shortness of breath seek  medical attention.  You need to lose weight.  Consider a lower calorie diet and regular exercise.  DASH Eating Plan DASH stands for "Dietary Approaches to Stop Hypertension." The DASH eating plan is a healthy eating plan that has been shown to reduce high blood pressure (hypertension). Additional health benefits may include reducing the risk of type 2 diabetes mellitus, heart disease, and stroke. The DASH eating plan may also help with weight loss. WHAT DO I NEED TO KNOW ABOUT THE DASH EATING PLAN? For the DASH eating plan, you will follow these general guidelines:  Choose foods with a percent daily value for sodium of less than 5% (as listed on the food label).  Use salt-free seasonings or herbs instead of table salt or sea salt.  Check with your health care provider or pharmacist before using salt substitutes.  Eat lower-sodium products, often labeled as "lower sodium" or "no salt added."  Eat fresh foods.  Eat more vegetables, fruits, and low-fat dairy products.  Choose whole grains. Look for the word "whole" as the first word in the ingredient list.  Choose fish and skinless chicken or [place] more often than red meat. Limit fish, poultry, and meat to 6 oz (170 g) each day.  Limit sweets, desserts, sugars, and sugary drinks.  Choose heart-healthy fats.  Limit cheese to 1 oz (28 g) per day.  Eat more home-cooked food and less restaurant, buffet, and fast food.  Limit fried foods.  Cook foods using methods other than frying.  Limit canned vegetables. If you do use them, rinse them well to decrease the sodium.  When eating at a restaurant, ask that your food be  prepared with less salt, or no salt if possible. WHAT FOODS CAN I EAT? Seek help from a dietitian for individual calorie needs. Grains Whole grain or whole wheat bread. Brown rice. Whole grain or whole wheat pasta. Quinoa, bulgur, and whole grain cereals. Low-sodium cereals. Corn or whole wheat flour tortillas. Whole grain cornbread. Whole grain crackers. Low-sodium crackers. Vegetables Fresh or frozen vegetables (raw, steamed, roasted, or grilled). Low-sodium or reduced-sodium tomato and vegetable juices. Low-sodium or reduced-sodium tomato sauce and paste. Low-sodium or reduced-sodium canned vegetables.  Fruits All fresh, canned (in natural juice), or frozen fruits. Meat and Other Protein Products Ground beef (85% or leaner), grass-fed beef, or beef trimmed of fat. Skinless chicken or [place]. Ground chicken or [place]. Pork trimmed of fat. All fish and seafood. Eggs. Dried beans, peas, or lentils. Unsalted nuts and seeds. Unsalted canned beans. Dairy Low-fat dairy products, such as skim or 1% milk, 2% or reduced-fat cheeses, low-fat ricotta or cottage cheese, or plain low-fat yogurt. Low-sodium or reduced-sodium cheeses. Fats and Oils Tub margarines without trans fats. Light or reduced-fat mayonnaise and salad dressings (reduced sodium). Avocado. Safflower, olive, or canola oils. Natural peanut or almond butter. Other Unsalted popcorn and pretzels. The items listed above may not be a complete list of recommended foods or beverages. Contact your dietitian for more options. WHAT FOODS ARE NOT RECOMMENDED? Grains White bread. White pasta. White rice. Refined cornbread. Bagels and croissants. Crackers that contain trans fat. Vegetables Creamed or fried vegetables. Vegetables  in a cheese sauce. Regular canned vegetables. Regular canned tomato sauce and paste. Regular tomato and vegetable juices. Fruits Dried fruits. Canned fruit in light or heavy syrup. Fruit juice. Meat and Other Protein  Products Fatty cuts of meat. Ribs, chicken wings, bacon, sausage, bologna, salami, chitterlings, fatback, hot dogs, bratwurst, and packaged luncheon meats. Salted nuts and seeds. Canned beans with salt. Dairy Whole or 2% milk, cream, half-and-half, and cream cheese. Whole-fat or sweetened yogurt. Full-fat cheeses or blue cheese. Nondairy creamers and whipped toppings. Processed cheese, cheese spreads, or cheese curds. Condiments Onion and garlic salt, seasoned salt, table salt, and sea salt. Canned and packaged gravies. Worcestershire sauce. Tartar sauce. Barbecue sauce. Teriyaki sauce. Soy sauce, including reduced sodium. Steak sauce. Fish sauce. Oyster sauce. Cocktail sauce. Horseradish. Ketchup and mustard. Meat flavorings and tenderizers. Bouillon cubes. Hot sauce. Tabasco sauce. Marinades. Taco seasonings. Relishes. Fats and Oils Butter, stick margarine, lard, shortening, ghee, and bacon fat. Coconut, palm kernel, or palm oils. Regular salad dressings. Other Pickles and olives. Salted popcorn and pretzels. The items listed above may not be a complete list of foods and beverages to avoid. Contact your dietitian for more information. WHERE CAN I FIND MORE INFORMATION? National Heart, Lung, and Blood Institute: travelstabloid.com   This information is not intended to replace advice given to you by your health care provider. Make sure you discuss any questions you have with your health care provider.   Document Released: 12/04/2011 Document Revised: 01/05/2015 Document Reviewed: 10/19/2013 Elsevier Interactive Patient Education [employer].  Return in 6 weeks for follow-up

## 2016-03-28 NOTE — Progress Notes (Signed)
Subjective:    Patient ID: Alan Tyler, male    DOB: 1956/06/13, 60 y.o.   MRN: [id number]  HPI   60 year old patient who is in today for a wellness exam.  He has a history of benign PVCs and was seen by cardiology in 2014 and had a negative stress echocardiogram at that time.  He does have a history of anxiety, depression, and has done well on bupropion.  He increased this medication to 300 mg  and has done much better.  He did have colonoscopy in 2014.  He does have a history of colonic polyps He also has a history of ADHD and has had a dramatic response with Adderall and quite pleased with the progress.    Family history mother age 76 with a history of thyroid cancer Father died age 84 MI.  First MI age 76 One brother, 3 sisters in good health.  One sister with breast cancer  Maternal grandmother with diabetes   BP Readings from Last 3 Encounters:  03/28/16 150/100  03/05/16 138/90  09/27/15 138/86     Wt Readings from Last 3 Encounters:  03/28/16 256 lb (116.121 kg)  03/05/16 253 lb (114.76 kg)  09/27/15 252 lb (114.306 kg)   Past Medical History  Diagnosis Date  . Arthritis   . Depression   . GERD (gastroesophageal reflux disease)   . PVC's (premature ventricular contractions)     arrhythmia  . Hyperlipidemia   . Chronic kidney disease     kidney stones and (R) nephrectomy  . Colon polyps   . Cancer [facility])     kidney - 2003    Social History   Social History  . Marital Status: Married    Spouse Name: N/A  . Number of Children: 3  . Years of Education: N/A   Occupational History  . manufacturing-unemployed    Social History Main Topics  . Smoking status: Former Smoker    Quit date: 12/29/2001  . Smokeless tobacco: Former Systems developer    Quit date: 12/27/2002  . Alcohol Use: Yes     Comment: rarely  . Drug Use: No  . Sexual Activity: Not on file   Other Topics Concern  . Not on file   Social History Narrative    Past Surgical History  Procedure  Laterality Date  . Nephrectomy  2003    (R) for cancer tumor  . Retinal detachment surgery  2012  . Cataract extraction      right eye    Family History  Problem Relation Age of Onset  . Arthritis Mother   . Depression Mother   . Colon cancer Mother     age 50 part of colon was removed  . Alcohol abuse Father   . Heart disease Father   . Hyperlipidemia Father   . Hyperlipidemia Maternal Grandmother   . Hyperlipidemia Maternal Grandfather   . Hyperlipidemia Paternal Grandmother   . Diabetes Paternal Grandmother   . Arthritis Paternal Grandfather   . Hyperlipidemia Paternal Grandfather   . Stomach cancer Neg Hx   . Esophageal cancer Neg Hx   . Rectal cancer Neg Hx     Allergies  Allergen Reactions  . Sulfa Drugs Cross Reactors     Per pt: unknown    Current Outpatient Prescriptions on File Prior to Visit  Medication Sig Dispense Refill  . aspirin 81 MG tablet Take 81 mg by mouth daily.      Marland Kitchen buPROPion (WELLBUTRIN XL) 300 MG 24  hr tablet TAKE 1 TABLET EVERY MORNING 90 tablet 1  . ibuprofen (ADVIL,MOTRIN) 200 MG tablet Take 200 mg by mouth every 6 (six) hours as needed.      Marland Kitchen ketoconazole (NIZORAL) 2 % shampoo APP EXT 3 TIMES A WEEK UTD  1  . ketorolac (ACULAR) 0.5 % ophthalmic solution INSTILL 1 DROP INTO THE LEFT EYE QID  2  . Omega-3 Fatty Acids (OMEGA 3 PO) Take by mouth. Take 3,300mg  (2 capsules) daily    . OVER THE COUNTER MEDICATION NATURAL PSYLIUM HUSK FIBER 0.52 grams,(5Capsules) daily     No current facility-administered medications on file prior to visit.    BP 150/100 mmHg  Pulse 99  Temp(Src) 98.5 F (36.9 C) (Oral)  Resp 20  Ht 5' 6.75" (1.695 m)  Wt 256 lb (116.121 kg)  BMI 40.42 kg/m2  SpO2 98%       Review of Systems  Constitutional: Negative for fever, chills, activity change, appetite change and fatigue.  HENT: Negative for congestion, dental problem, ear pain, hearing loss, mouth sores, rhinorrhea, sinus pressure, sneezing, tinnitus,  trouble swallowing and voice change.   Eyes: Negative for photophobia, pain, redness and visual disturbance.  Respiratory: Negative for apnea, cough, choking, chest tightness, shortness of breath and wheezing.   Cardiovascular: Negative for chest pain, palpitations and leg swelling.  Gastrointestinal: Negative for nausea, vomiting, abdominal pain, diarrhea, constipation, blood in stool, abdominal distention, anal bleeding and rectal pain.  Genitourinary: Negative for dysuria, urgency, frequency, hematuria, flank pain, decreased urine volume, discharge, penile swelling, scrotal swelling, difficulty urinating, genital sores and testicular pain.  Musculoskeletal: Negative for myalgias, back pain, joint swelling, arthralgias, gait problem, neck pain and neck stiffness.  Skin: Negative for color change, rash and wound.  Neurological: Negative for dizziness, tremors, seizures, syncope, facial asymmetry, speech difficulty, weakness, light-headedness, numbness and headaches.  Hematological: Negative for adenopathy. Does not bruise/bleed easily.  Psychiatric/Behavioral: Negative for suicidal ideas, hallucinations, behavioral problems, confusion, sleep disturbance, self-injury, dysphoric mood, decreased concentration and agitation. The patient is not nervous/anxious.        Objective:   Physical Exam  Constitutional: He appears well-developed and well-nourished.  Obese  Blood pressure 140/100  HENT:  Head: Normocephalic and atraumatic.  Right Ear: External ear normal.  Left Ear: External ear normal.  Nose: Nose normal.  Mouth/Throat: Oropharynx is clear and moist.  Eyes: Conjunctivae and EOM are normal. Pupils are equal, round, and reactive to light. No scleral icterus.  Neck: Normal range of motion. Neck supple. No JVD present. No thyromegaly present.  Cardiovascular: Regular rhythm, normal heart sounds and intact distal pulses.  Exam reveals no gallop and no friction rub.   No murmur  heard. Pulmonary/Chest: Effort normal and breath sounds normal. He exhibits no tenderness.  Abdominal: Soft. Bowel sounds are normal. He exhibits no distension and no mass. There is no tenderness.  Lower midline scar  Genitourinary: Prostate normal and penis normal. Guaiac negative stool.  Prostate minimally enlarged.  Left lobe slightly more prominent  Musculoskeletal: Normal range of motion. He exhibits no edema or tenderness.  Lymphadenopathy:    He has no cervical adenopathy.  Neurological: He is alert. He has normal reflexes. No cranial nerve deficit. Coordination normal.  Skin: Skin is warm and dry. No rash noted.  Psychiatric: He has a normal mood and affect. His behavior is normal.          Assessment & Plan:   Preventive health examination Benign PVCs Personal history colonic polyps.  Continue five-year surveillance intervals History of depression, stable and improved on increased dose of bupropion.  We'll continue present dose Status post nephrectomy for renal cancer ADHD.  Well controlled on Adderall.  Will continue. Obesity Impaired glucose tolerance Rule out sustained hypertension.  Will place on a DASH diet.  Home blood pressure monitoring.  Encouraged recheck 6 weeks

## 2016-04-02 DIAGNOSIS — H33002 Unspecified retinal detachment with retinal break, left eye: Secondary | ICD-10-CM | POA: Diagnosis not present

## 2016-04-02 DIAGNOSIS — Z961 Presence of intraocular lens: Secondary | ICD-10-CM | POA: Diagnosis not present

## 2016-04-02 DIAGNOSIS — H35352 Cystoid macular degeneration, left eye: Secondary | ICD-10-CM | POA: Diagnosis not present

## 2016-04-02 DIAGNOSIS — H2702 Aphakia, left eye: Secondary | ICD-10-CM | POA: Diagnosis not present

## 2016-04-03 ENCOUNTER — Encounter: Payer: Self-pay | Admitting: Adult Health

## 2016-04-03 ENCOUNTER — Ambulatory Visit (INDEPENDENT_AMBULATORY_CARE_PROVIDER_SITE_OTHER): Payer: BLUE CROSS/BLUE SHIELD | Admitting: Adult Health

## 2016-04-03 VITALS — BP 130/96 | HR 98 | Temp 98.5°F | Wt 253.0 lb

## 2016-04-03 DIAGNOSIS — I10 Essential (primary) hypertension: Secondary | ICD-10-CM

## 2016-04-03 NOTE — Progress Notes (Signed)
Subjective:    Patient ID: Alan Tyler, male    DOB: Jan 01, 1956, 60 y.o.   MRN: [id number]  HPI   60 year old male who presents to the office today for high blood pressure readings. He was seen by his PCP last week and his BP was 150/100. He was told to work on diet and exercise.   He has been checking his BP at home and his BP has been " all over the place" He reports between [id number] systolic.  Today he had a reading of 168/ 100 this afternoon. He has had a headache since this morning. He denies any blurred vision.   BP Readings from Last 3 Encounters:  04/03/16 130/96  03/28/16 150/100  03/05/16 138/90   He states " I want to do this with diet and exercise, I just want to make sure I won't have a stroke."   He reports that he is exercising and has cut out all salts, is eating whole grains, fruits and vegetables.   Review of Systems  Constitutional: Negative.   Respiratory: Negative.   Cardiovascular: Negative.   Neurological: Positive for headaches. Negative for dizziness, seizures, facial asymmetry, weakness and numbness.  Hematological: Negative.   All other systems reviewed and are negative.  Past Medical History  Diagnosis Date  . Arthritis   . Depression   . GERD (gastroesophageal reflux disease)   . PVC's (premature ventricular contractions)     arrhythmia  . Hyperlipidemia   . Chronic kidney disease     kidney stones and (R) nephrectomy  . Colon polyps   . Cancer [facility])     kidney - 2003    Social History   Social History  . Marital Status: Married    Spouse Name: N/A  . Number of Children: 3  . Years of Education: N/A   Occupational History  . manufacturing-unemployed    Social History Main Topics  . Smoking status: Former Smoker    Quit date: 12/29/2001  . Smokeless tobacco: Former Systems developer    Quit date: 12/27/2002  . Alcohol Use: Yes     Comment: rarely  . Drug Use: No  . Sexual Activity: Not on file   Other Topics Concern  . Not on file    Social History Narrative    Past Surgical History  Procedure Laterality Date  . Nephrectomy  2003    (R) for cancer tumor  . Retinal detachment surgery  2012  . Cataract extraction      right eye    Family History  Problem Relation Age of Onset  . Arthritis Mother   . Depression Mother   . Colon cancer Mother     age 28 part of colon was removed  . Alcohol abuse Father   . Heart disease Father   . Hyperlipidemia Father   . Hyperlipidemia Maternal Grandmother   . Hyperlipidemia Maternal Grandfather   . Hyperlipidemia Paternal Grandmother   . Diabetes Paternal Grandmother   . Arthritis Paternal Grandfather   . Hyperlipidemia Paternal Grandfather   . Stomach cancer Neg Hx   . Esophageal cancer Neg Hx   . Rectal cancer Neg Hx     Allergies  Allergen Reactions  . Sulfa Drugs Cross Reactors     Per pt: unknown    Current Outpatient Prescriptions on File Prior to Visit  Medication Sig Dispense Refill  . ALPRAZolam (XANAX) 0.5 MG tablet Take 1 tablet (0.5 mg total) by mouth daily as needed. 30 tablet  2  . amphetamine-dextroamphetamine (ADDERALL XR) 20 MG 24 hr capsule Take 1 capsule (20 mg total) by mouth daily. 30 capsule 0  . amphetamine-dextroamphetamine (ADDERALL XR) 20 MG 24 hr capsule Take 1 capsule (20 mg total) by mouth daily. 30 capsule 0  . amphetamine-dextroamphetamine (ADDERALL XR) 20 MG 24 hr capsule Take 1 capsule (20 mg total) by mouth daily. 30 capsule 0  . amphetamine-dextroamphetamine (ADDERALL) 10 MG tablet Take 1 tablet (10 mg total) by mouth 2 (two) times daily. 60 tablet 0  . amphetamine-dextroamphetamine (ADDERALL) 10 MG tablet Take 1 tablet (10 mg total) by mouth 2 (two) times daily. 60 tablet 0  . amphetamine-dextroamphetamine (ADDERALL) 10 MG tablet Take 1 tablet (10 mg total) by mouth 2 (two) times daily. 60 tablet 0  . aspirin 81 MG tablet Take 81 mg by mouth daily.      Marland Kitchen buPROPion (WELLBUTRIN XL) 300 MG 24 hr tablet TAKE 1 TABLET EVERY MORNING  90 tablet 1  . ibuprofen (ADVIL,MOTRIN) 200 MG tablet Take 200 mg by mouth every 6 (six) hours as needed.      Marland Kitchen ketoconazole (NIZORAL) 2 % shampoo APP EXT 3 TIMES A WEEK UTD  1  . ketorolac (ACULAR) 0.5 % ophthalmic solution INSTILL 1 DROP INTO THE LEFT EYE QID  2  . Omega-3 Fatty Acids (OMEGA 3 PO) Take by mouth. Take 3,300mg  (2 capsules) daily    . OVER THE COUNTER MEDICATION NATURAL PSYLIUM HUSK FIBER 0.52 grams,(5Capsules) daily     No current facility-administered medications on file prior to visit.    BP 130/96 mmHg  Pulse 98  Temp(Src) 98.5 F (36.9 C) (Oral)  Wt 253 lb (114.76 kg)       Objective:   Physical Exam  Constitutional: He is oriented to person, place, and time. He appears well-developed and well-nourished. No distress.  Eyes: Conjunctivae and EOM are normal. Pupils are equal, round, and reactive to light. Right eye exhibits no discharge. Left eye exhibits no discharge.  Cardiovascular: Normal rate, regular rhythm, normal heart sounds and intact distal pulses.  Exam reveals no gallop and no friction rub.   No murmur heard. Pulmonary/Chest: Effort normal and breath sounds normal. No respiratory distress. He has no wheezes. He has no rales. He exhibits no tenderness.  Neurological: He is alert and oriented to person, place, and time.  Skin: Skin is warm and dry. No rash noted. He is not diaphoretic. No erythema. No pallor.  Psychiatric: He has a normal mood and affect. His behavior is normal. Thought content normal.  Nursing note and vitals reviewed.     Assessment & Plan:  1. Essential hypertension - Continue with diet and exercise. Monitor BP at home and follow up with Dr. Raliegh Ip if BP consistently above 150.  - Tylenol/Nsaids for headache.  - Go to the ER with blurred vision or loss of sensation.   Dorothyann Peng, NP

## 2016-04-03 NOTE — Progress Notes (Signed)
Pre visit review using our clinic review tool, if applicable. No additional management support is needed unless otherwise documented below in the visit note. 

## 2016-05-19 DIAGNOSIS — M170 Bilateral primary osteoarthritis of knee: Secondary | ICD-10-CM | POA: Diagnosis not present

## 2016-05-19 DIAGNOSIS — M1711 Unilateral primary osteoarthritis, right knee: Secondary | ICD-10-CM | POA: Diagnosis not present

## 2016-05-19 DIAGNOSIS — M1712 Unilateral primary osteoarthritis, left knee: Secondary | ICD-10-CM | POA: Diagnosis not present

## 2016-05-23 ENCOUNTER — Ambulatory Visit: Payer: BLUE CROSS/BLUE SHIELD | Admitting: Internal Medicine

## 2016-06-13 ENCOUNTER — Ambulatory Visit (INDEPENDENT_AMBULATORY_CARE_PROVIDER_SITE_OTHER): Payer: BLUE CROSS/BLUE SHIELD | Admitting: Internal Medicine

## 2016-06-13 ENCOUNTER — Encounter: Payer: Self-pay | Admitting: Internal Medicine

## 2016-06-13 VITALS — BP 136/88 | HR 87 | Temp 98.4°F | Resp 20 | Ht 66.75 in | Wt 248.0 lb

## 2016-06-13 DIAGNOSIS — F909 Attention-deficit hyperactivity disorder, unspecified type: Secondary | ICD-10-CM | POA: Diagnosis not present

## 2016-06-13 DIAGNOSIS — F988 Other specified behavioral and emotional disorders with onset usually occurring in childhood and adolescence: Secondary | ICD-10-CM

## 2016-06-13 NOTE — Patient Instructions (Signed)

## 2016-06-13 NOTE — Progress Notes (Signed)
Subjective:    Patient ID: Alan Tyler, male    DOB: 06/20/1956, 60 y.o.   MRN: [id number]  HPI  Wt Readings from Last 3 Encounters:  06/13/16 248 lb (112.492 kg)  04/03/16 253 lb (114.76 kg)  03/28/16 256 lb (116.121 kg)    BP Readings from Last 3 Encounters:  06/13/16 136/88  04/03/16 130/96  03/28/16 67/68    60 year old patient who is seen today for follow-up of blood pressure.  Blood pressure readings have trended up, but he has started on a nonpharmacologic regimen a better diet, exercise, weight loss and stress reduction.  Home blood pressure readings are generally less than 140 over 90  No concerns or complaints  Past Medical History  Diagnosis Date  . Arthritis   . Depression   . GERD (gastroesophageal reflux disease)   . PVC's (premature ventricular contractions)     arrhythmia  . Hyperlipidemia   . Chronic kidney disease     kidney stones and (R) nephrectomy  . Colon polyps   . Cancer [facility])     kidney - 2003     Social History   Social History  . Marital Status: Married    Spouse Name: N/A  . Number of Children: 3  . Years of Education: N/A   Occupational History  . manufacturing-unemployed    Social History Main Topics  . Smoking status: Former Smoker    Quit date: 12/29/2001  . Smokeless tobacco: Former Systems developer    Quit date: 12/27/2002  . Alcohol Use: Yes     Comment: rarely  . Drug Use: No  . Sexual Activity: Not on file   Other Topics Concern  . Not on file   Social History Narrative    Past Surgical History  Procedure Laterality Date  . Nephrectomy  2003    (R) for cancer tumor  . Retinal detachment surgery  2012  . Cataract extraction      right eye    Family History  Problem Relation Age of Onset  . Arthritis Mother   . Depression Mother   . Colon cancer Mother     age 63 part of colon was removed  . Alcohol abuse Father   . Heart disease Father   . Hyperlipidemia Father   . Hyperlipidemia Maternal Grandmother   .  Hyperlipidemia Maternal Grandfather   . Hyperlipidemia Paternal Grandmother   . Diabetes Paternal Grandmother   . Arthritis Paternal Grandfather   . Hyperlipidemia Paternal Grandfather   . Stomach cancer Neg Hx   . Esophageal cancer Neg Hx   . Rectal cancer Neg Hx     Allergies  Allergen Reactions  . Sulfa Drugs Cross Reactors     Per pt: unknown    Current Outpatient Prescriptions on File Prior to Visit  Medication Sig Dispense Refill  . ALPRAZolam (XANAX) 0.5 MG tablet Take 1 tablet (0.5 mg total) by mouth daily as needed. 30 tablet 2  . amphetamine-dextroamphetamine (ADDERALL XR) 20 MG 24 hr capsule Take 1 capsule (20 mg total) by mouth daily. 30 capsule 0  . amphetamine-dextroamphetamine (ADDERALL XR) 20 MG 24 hr capsule Take 1 capsule (20 mg total) by mouth daily. 30 capsule 0  . amphetamine-dextroamphetamine (ADDERALL XR) 20 MG 24 hr capsule Take 1 capsule (20 mg total) by mouth daily. 30 capsule 0  . amphetamine-dextroamphetamine (ADDERALL) 10 MG tablet Take 1 tablet (10 mg total) by mouth 2 (two) times daily. 60 tablet 0  . amphetamine-dextroamphetamine (ADDERALL) 10 MG  tablet Take 1 tablet (10 mg total) by mouth 2 (two) times daily. 60 tablet 0  . amphetamine-dextroamphetamine (ADDERALL) 10 MG tablet Take 1 tablet (10 mg total) by mouth 2 (two) times daily. 60 tablet 0  . aspirin 81 MG tablet Take 81 mg by mouth daily.      Marland Kitchen buPROPion (WELLBUTRIN XL) 300 MG 24 hr tablet TAKE 1 TABLET EVERY MORNING 90 tablet 1  . ibuprofen (ADVIL,MOTRIN) 200 MG tablet Take 200 mg by mouth every 6 (six) hours as needed.      Marland Kitchen ketoconazole (NIZORAL) 2 % shampoo APP EXT 3 TIMES A WEEK UTD  1  . ketorolac (ACULAR) 0.5 % ophthalmic solution INSTILL 1 DROP INTO THE LEFT EYE QID  2  . Omega-3 Fatty Acids (OMEGA 3 PO) Take by mouth. Take 3,300mg  (2 capsules) daily    . OVER THE COUNTER MEDICATION NATURAL PSYLIUM HUSK FIBER 0.52 grams,(5Capsules) daily     No current facility-administered  medications on file prior to visit.    BP 136/88 mmHg  Pulse 87  Temp(Src) 98.4 F (36.9 C) (Oral)  Resp 20  Ht 5' 6.75" (1.695 m)  Wt 248 lb (112.492 kg)  BMI 39.15 kg/m2  SpO2 99%     Review of Systems  Constitutional: Negative for fever, chills, appetite change and fatigue.  HENT: Negative for congestion, dental problem, ear pain, hearing loss, sore throat, tinnitus, trouble swallowing and voice change.   Eyes: Negative for pain, discharge and visual disturbance.  Respiratory: Negative for cough, chest tightness, wheezing and stridor.   Cardiovascular: Negative for chest pain, palpitations and leg swelling.  Gastrointestinal: Negative for nausea, vomiting, abdominal pain, diarrhea, constipation, blood in stool and abdominal distention.  Genitourinary: Negative for urgency, hematuria, flank pain, discharge, difficulty urinating and genital sores.  Musculoskeletal: Negative for myalgias, back pain, joint swelling, arthralgias, gait problem and neck stiffness.  Skin: Negative for rash.  Neurological: Negative for dizziness, syncope, speech difficulty, weakness, numbness and headaches.  Hematological: Negative for adenopathy. Does not bruise/bleed easily.  Psychiatric/Behavioral: Negative for behavioral problems and dysphoric mood. The patient is not nervous/anxious.        Objective:   Physical Exam  Constitutional:  Overweight Blood pressure on arrival 136 over 88 Repeat blood pressure 120/84          Assessment & Plan:   Mild obesity Hypertensive suspect.  Will continue nonpharmacologic management and close home blood pressure monitoring.  He will call the office if blood pressures consistently greater than 140 over 90.  Otherwise, we'll see for his annual exam in 10 months    Nyoka Cowden, MD

## 2016-06-13 NOTE — Progress Notes (Signed)
Pre visit review using our clinic review tool, if applicable. No additional management support is needed unless otherwise documented below in the visit note. 

## 2016-07-04 ENCOUNTER — Other Ambulatory Visit: Payer: Self-pay | Admitting: Internal Medicine

## 2016-07-04 MED ORDER — AMPHETAMINE-DEXTROAMPHETAMINE 10 MG PO TABS: 10.0000 mg | ORAL_TABLET | Freq: Two times a day (BID) | ORAL | Status: DC

## 2016-07-04 MED ORDER — AMPHETAMINE-DEXTROAMPHET ER 20 MG PO CP24: 20.0000 mg | ORAL_CAPSULE | Freq: Every day | ORAL | Status: DC

## 2016-07-04 NOTE — Telephone Encounter (Signed)
Pt request refill  °amphetamine-dextroamphetamine (ADDERALL XR) 20 MG 24 hr capsule ° ° °

## 2016-07-04 NOTE — Telephone Encounter (Signed)
Spoke with patient and informed that prescriptions were signed and will be up front.

## 2016-07-14 ENCOUNTER — Ambulatory Visit (INDEPENDENT_AMBULATORY_CARE_PROVIDER_SITE_OTHER): Payer: BLUE CROSS/BLUE SHIELD | Admitting: Internal Medicine

## 2016-07-14 ENCOUNTER — Encounter: Payer: Self-pay | Admitting: Internal Medicine

## 2016-07-14 VITALS — BP 130/80 | HR 97 | Temp 98.5°F | Ht 66.75 in | Wt 253.0 lb

## 2016-07-14 DIAGNOSIS — F32A Depression, unspecified: Secondary | ICD-10-CM

## 2016-07-14 DIAGNOSIS — Z860100 Personal history of colon polyps, unspecified: Secondary | ICD-10-CM

## 2016-07-14 DIAGNOSIS — F988 Other specified behavioral and emotional disorders with onset usually occurring in childhood and adolescence: Secondary | ICD-10-CM

## 2016-07-14 DIAGNOSIS — F329 Major depressive disorder, single episode, unspecified: Secondary | ICD-10-CM

## 2016-07-14 DIAGNOSIS — F909 Attention-deficit hyperactivity disorder, unspecified type: Secondary | ICD-10-CM

## 2016-07-14 DIAGNOSIS — R361 Hematospermia: Secondary | ICD-10-CM

## 2016-07-14 NOTE — Progress Notes (Signed)
Pre visit review using our clinic review tool, if applicable. No additional management support is needed unless otherwise documented below in the visit note. 

## 2016-07-14 NOTE — Patient Instructions (Signed)
Call or return to clinic prn if these symptoms worsen or fail to improve as anticipated.

## 2016-07-14 NOTE — Progress Notes (Signed)
Subjective:    Patient ID: Alan Tyler, male    DOB: 30-Aug-1956, 60 y.o.   MRN: [id number]  HPI  60 year old patient who has a history of ADHD. For the past 3 weeks he has had scanty amount of blood in his ejaculate.  He has had no pain associated with urination or orgasm.  He states the issue seems to be improving. Did have a urinalysis performed in March that was normal  Past Medical History  Diagnosis Date  . Arthritis   . Depression   . GERD (gastroesophageal reflux disease)   . PVC's (premature ventricular contractions)     arrhythmia  . Hyperlipidemia   . Chronic kidney disease     kidney stones and (R) nephrectomy  . Colon polyps   . Cancer [facility])     kidney - 2003     Social History   Social History  . Marital Status: Married    Spouse Name: N/A  . Number of Children: 3  . Years of Education: N/A   Occupational History  . manufacturing-unemployed    Social History Main Topics  . Smoking status: Former Smoker    Quit date: 12/29/2001  . Smokeless tobacco: Former Systems developer    Quit date: 12/27/2002  . Alcohol Use: Yes     Comment: rarely  . Drug Use: No  . Sexual Activity: Not on file   Other Topics Concern  . Not on file   Social History Narrative    Past Surgical History  Procedure Laterality Date  . Nephrectomy  2003    (R) for cancer tumor  . Retinal detachment surgery  2012  . Cataract extraction      right eye    Family History  Problem Relation Age of Onset  . Arthritis Mother   . Depression Mother   . Colon cancer Mother     age 23 part of colon was removed  . Alcohol abuse Father   . Heart disease Father   . Hyperlipidemia Father   . Hyperlipidemia Maternal Grandmother   . Hyperlipidemia Maternal Grandfather   . Hyperlipidemia Paternal Grandmother   . Diabetes Paternal Grandmother   . Arthritis Paternal Grandfather   . Hyperlipidemia Paternal Grandfather   . Stomach cancer Neg Hx   . Esophageal cancer Neg Hx   . Rectal cancer  Neg Hx     Allergies  Allergen Reactions  . Sulfa Drugs Cross Reactors     Per pt: unknown    Current Outpatient Prescriptions on File Prior to Visit  Medication Sig Dispense Refill  . ALPRAZolam (XANAX) 0.5 MG tablet Take 1 tablet (0.5 mg total) by mouth daily as needed. 30 tablet 2  . amphetamine-dextroamphetamine (ADDERALL XR) 20 MG 24 hr capsule Take 1 capsule (20 mg total) by mouth daily. 30 capsule 0  . amphetamine-dextroamphetamine (ADDERALL XR) 20 MG 24 hr capsule Take 1 capsule (20 mg total) by mouth daily. 30 capsule 0  . amphetamine-dextroamphetamine (ADDERALL XR) 20 MG 24 hr capsule Take 1 capsule (20 mg total) by mouth daily. 30 capsule 0  . amphetamine-dextroamphetamine (ADDERALL) 10 MG tablet Take 1 tablet (10 mg total) by mouth 2 (two) times daily. 60 tablet 0  . amphetamine-dextroamphetamine (ADDERALL) 10 MG tablet Take 1 tablet (10 mg total) by mouth 2 (two) times daily. 60 tablet 0  . amphetamine-dextroamphetamine (ADDERALL) 10 MG tablet Take 1 tablet (10 mg total) by mouth 2 (two) times daily. 60 tablet 0  . aspirin 81 MG tablet  Take 81 mg by mouth daily.      Marland Kitchen buPROPion (WELLBUTRIN XL) 300 MG 24 hr tablet TAKE 1 TABLET EVERY MORNING 90 tablet 1  . ibuprofen (ADVIL,MOTRIN) 200 MG tablet Take 200 mg by mouth every 6 (six) hours as needed.      Marland Kitchen ketoconazole (NIZORAL) 2 % shampoo APP EXT 3 TIMES A WEEK UTD  1  . ketorolac (ACULAR) 0.5 % ophthalmic solution INSTILL 1 DROP INTO THE LEFT EYE QID  2  . Omega-3 Fatty Acids (OMEGA 3 PO) Take by mouth. Take 3,300mg  (2 capsules) daily    . OVER THE COUNTER MEDICATION NATURAL PSYLIUM HUSK FIBER 0.52 grams,(5Capsules) daily     No current facility-administered medications on file prior to visit.    BP 130/80 mmHg  Pulse 97  Temp(Src) 98.5 F (36.9 C) (Oral)  Ht 5' 6.75" (1.695 m)  Wt 253 lb (114.76 kg)  BMI 39.94 kg/m2  SpO2 98%     Review of Systems  Constitutional: Negative for fever, chills, appetite change and  fatigue.  HENT: Negative for congestion, dental problem, ear pain, hearing loss, sore throat, tinnitus, trouble swallowing and voice change.   Eyes: Negative for pain, discharge and visual disturbance.  Respiratory: Negative for cough, chest tightness, wheezing and stridor.   Cardiovascular: Negative for chest pain, palpitations and leg swelling.  Gastrointestinal: Negative for nausea, vomiting, abdominal pain, diarrhea, constipation, blood in stool and abdominal distention.  Genitourinary: Negative for dysuria, urgency, hematuria, flank pain, decreased urine volume, discharge, penile swelling, scrotal swelling, difficulty urinating, genital sores, penile pain and testicular pain.  Musculoskeletal: Negative for myalgias, back pain, joint swelling, arthralgias, gait problem and neck stiffness.  Skin: Negative for rash.  Neurological: Negative for dizziness, syncope, speech difficulty, weakness, numbness and headaches.  Hematological: Negative for adenopathy. Does not bruise/bleed easily.  Psychiatric/Behavioral: Negative for behavioral problems and dysphoric mood. The patient is not nervous/anxious.        Objective:   Physical Exam  Blood pressure 130/82      Assessment & Plan:   History of bloody ejaculate.  This seems to be improving.  Options discussed.  Elected to observe at this point.  He will call if problem persist or he develops any new or bothersome symptoms  Nyoka Cowden, MD

## 2016-08-23 ENCOUNTER — Other Ambulatory Visit: Payer: Self-pay | Admitting: Internal Medicine

## 2016-09-08 DIAGNOSIS — M542 Cervicalgia: Secondary | ICD-10-CM | POA: Diagnosis not present

## 2016-09-08 DIAGNOSIS — M5127 Other intervertebral disc displacement, lumbosacral region: Secondary | ICD-10-CM | POA: Diagnosis not present

## 2016-09-08 DIAGNOSIS — M25552 Pain in left hip: Secondary | ICD-10-CM | POA: Diagnosis not present

## 2016-09-08 DIAGNOSIS — M25562 Pain in left knee: Secondary | ICD-10-CM | POA: Diagnosis not present

## 2016-09-08 DIAGNOSIS — R202 Paresthesia of skin: Secondary | ICD-10-CM | POA: Diagnosis not present

## 2016-09-08 DIAGNOSIS — M791 Myalgia: Secondary | ICD-10-CM | POA: Diagnosis not present

## 2016-09-08 DIAGNOSIS — M461 Sacroiliitis, not elsewhere classified: Secondary | ICD-10-CM | POA: Diagnosis not present

## 2016-09-08 DIAGNOSIS — M25551 Pain in right hip: Secondary | ICD-10-CM | POA: Diagnosis not present

## 2016-09-08 DIAGNOSIS — M5137 Other intervertebral disc degeneration, lumbosacral region: Secondary | ICD-10-CM | POA: Diagnosis not present

## 2016-09-08 DIAGNOSIS — M25561 Pain in right knee: Secondary | ICD-10-CM | POA: Diagnosis not present

## 2016-09-08 DIAGNOSIS — M545 Low back pain: Secondary | ICD-10-CM | POA: Diagnosis not present

## 2016-09-10 DIAGNOSIS — M25561 Pain in right knee: Secondary | ICD-10-CM | POA: Diagnosis not present

## 2016-09-10 DIAGNOSIS — M791 Myalgia: Secondary | ICD-10-CM | POA: Diagnosis not present

## 2016-09-10 DIAGNOSIS — M5127 Other intervertebral disc displacement, lumbosacral region: Secondary | ICD-10-CM | POA: Diagnosis not present

## 2016-09-10 DIAGNOSIS — M5137 Other intervertebral disc degeneration, lumbosacral region: Secondary | ICD-10-CM | POA: Diagnosis not present

## 2016-09-10 DIAGNOSIS — M461 Sacroiliitis, not elsewhere classified: Secondary | ICD-10-CM | POA: Diagnosis not present

## 2016-09-10 DIAGNOSIS — M545 Low back pain: Secondary | ICD-10-CM | POA: Diagnosis not present

## 2016-09-15 DIAGNOSIS — M5137 Other intervertebral disc degeneration, lumbosacral region: Secondary | ICD-10-CM | POA: Diagnosis not present

## 2016-09-15 DIAGNOSIS — M791 Myalgia: Secondary | ICD-10-CM | POA: Diagnosis not present

## 2016-09-15 DIAGNOSIS — M5127 Other intervertebral disc displacement, lumbosacral region: Secondary | ICD-10-CM | POA: Diagnosis not present

## 2016-09-15 DIAGNOSIS — M461 Sacroiliitis, not elsewhere classified: Secondary | ICD-10-CM | POA: Diagnosis not present

## 2016-09-17 DIAGNOSIS — M461 Sacroiliitis, not elsewhere classified: Secondary | ICD-10-CM | POA: Diagnosis not present

## 2016-09-17 DIAGNOSIS — M545 Low back pain: Secondary | ICD-10-CM | POA: Diagnosis not present

## 2016-09-17 DIAGNOSIS — M791 Myalgia: Secondary | ICD-10-CM | POA: Diagnosis not present

## 2016-09-17 DIAGNOSIS — M5127 Other intervertebral disc displacement, lumbosacral region: Secondary | ICD-10-CM | POA: Diagnosis not present

## 2016-09-17 DIAGNOSIS — M5137 Other intervertebral disc degeneration, lumbosacral region: Secondary | ICD-10-CM | POA: Diagnosis not present

## 2016-09-19 DIAGNOSIS — M5127 Other intervertebral disc displacement, lumbosacral region: Secondary | ICD-10-CM | POA: Diagnosis not present

## 2016-09-19 DIAGNOSIS — M545 Low back pain: Secondary | ICD-10-CM | POA: Diagnosis not present

## 2016-09-19 DIAGNOSIS — M5137 Other intervertebral disc degeneration, lumbosacral region: Secondary | ICD-10-CM | POA: Diagnosis not present

## 2016-09-19 DIAGNOSIS — M791 Myalgia: Secondary | ICD-10-CM | POA: Diagnosis not present

## 2016-09-19 DIAGNOSIS — M461 Sacroiliitis, not elsewhere classified: Secondary | ICD-10-CM | POA: Diagnosis not present

## 2016-09-22 DIAGNOSIS — R202 Paresthesia of skin: Secondary | ICD-10-CM | POA: Diagnosis not present

## 2016-09-22 DIAGNOSIS — M4722 Other spondylosis with radiculopathy, cervical region: Secondary | ICD-10-CM | POA: Diagnosis not present

## 2016-09-24 DIAGNOSIS — M461 Sacroiliitis, not elsewhere classified: Secondary | ICD-10-CM | POA: Diagnosis not present

## 2016-09-24 DIAGNOSIS — M791 Myalgia: Secondary | ICD-10-CM | POA: Diagnosis not present

## 2016-09-24 DIAGNOSIS — M5137 Other intervertebral disc degeneration, lumbosacral region: Secondary | ICD-10-CM | POA: Diagnosis not present

## 2016-09-24 DIAGNOSIS — M5127 Other intervertebral disc displacement, lumbosacral region: Secondary | ICD-10-CM | POA: Diagnosis not present

## 2016-09-29 DIAGNOSIS — M5127 Other intervertebral disc displacement, lumbosacral region: Secondary | ICD-10-CM | POA: Diagnosis not present

## 2016-09-29 DIAGNOSIS — M461 Sacroiliitis, not elsewhere classified: Secondary | ICD-10-CM | POA: Diagnosis not present

## 2016-09-29 DIAGNOSIS — M5137 Other intervertebral disc degeneration, lumbosacral region: Secondary | ICD-10-CM | POA: Diagnosis not present

## 2016-09-29 DIAGNOSIS — M791 Myalgia: Secondary | ICD-10-CM | POA: Diagnosis not present

## 2016-09-29 DIAGNOSIS — M545 Low back pain: Secondary | ICD-10-CM | POA: Diagnosis not present

## 2016-10-01 DIAGNOSIS — M461 Sacroiliitis, not elsewhere classified: Secondary | ICD-10-CM | POA: Diagnosis not present

## 2016-10-01 DIAGNOSIS — M5137 Other intervertebral disc degeneration, lumbosacral region: Secondary | ICD-10-CM | POA: Diagnosis not present

## 2016-10-01 DIAGNOSIS — M5127 Other intervertebral disc displacement, lumbosacral region: Secondary | ICD-10-CM | POA: Diagnosis not present

## 2016-10-01 DIAGNOSIS — M791 Myalgia: Secondary | ICD-10-CM | POA: Diagnosis not present

## 2016-10-03 DIAGNOSIS — M5137 Other intervertebral disc degeneration, lumbosacral region: Secondary | ICD-10-CM | POA: Diagnosis not present

## 2016-10-03 DIAGNOSIS — M791 Myalgia: Secondary | ICD-10-CM | POA: Diagnosis not present

## 2016-10-03 DIAGNOSIS — M545 Low back pain: Secondary | ICD-10-CM | POA: Diagnosis not present

## 2016-10-03 DIAGNOSIS — M461 Sacroiliitis, not elsewhere classified: Secondary | ICD-10-CM | POA: Diagnosis not present

## 2016-10-03 DIAGNOSIS — M5127 Other intervertebral disc displacement, lumbosacral region: Secondary | ICD-10-CM | POA: Diagnosis not present

## 2016-10-06 ENCOUNTER — Telehealth: Payer: Self-pay | Admitting: Internal Medicine

## 2016-10-06 DIAGNOSIS — M791 Myalgia: Secondary | ICD-10-CM | POA: Diagnosis not present

## 2016-10-06 DIAGNOSIS — M5137 Other intervertebral disc degeneration, lumbosacral region: Secondary | ICD-10-CM | POA: Diagnosis not present

## 2016-10-06 DIAGNOSIS — M5127 Other intervertebral disc displacement, lumbosacral region: Secondary | ICD-10-CM | POA: Diagnosis not present

## 2016-10-06 DIAGNOSIS — M545 Low back pain: Secondary | ICD-10-CM | POA: Diagnosis not present

## 2016-10-06 DIAGNOSIS — M461 Sacroiliitis, not elsewhere classified: Secondary | ICD-10-CM | POA: Diagnosis not present

## 2016-10-06 NOTE — Telephone Encounter (Signed)
Pt need new Rx for Adderall   Pt is aware of the three day refill and that Dr. Raliegh Ip is not in the office until 10/13/16.

## 2016-10-07 MED ORDER — AMPHETAMINE-DEXTROAMPHETAMINE 10 MG PO TABS: 10.0000 mg | ORAL_TABLET | Freq: Two times a day (BID) | ORAL | 0 refills | Status: DC

## 2016-10-07 MED ORDER — AMPHETAMINE-DEXTROAMPHET ER 20 MG PO CP24: 20.0000 mg | ORAL_CAPSULE | Freq: Every day | ORAL | 0 refills | Status: DC

## 2016-10-07 NOTE — Telephone Encounter (Signed)
Pt notified Rx's ready for pickup will be at the front desk. Rx's printed and signed by Dr. Sherren Mocha.

## 2016-10-08 DIAGNOSIS — M5127 Other intervertebral disc displacement, lumbosacral region: Secondary | ICD-10-CM | POA: Diagnosis not present

## 2016-10-08 DIAGNOSIS — M5137 Other intervertebral disc degeneration, lumbosacral region: Secondary | ICD-10-CM | POA: Diagnosis not present

## 2016-10-08 DIAGNOSIS — M791 Myalgia: Secondary | ICD-10-CM | POA: Diagnosis not present

## 2016-10-08 DIAGNOSIS — M461 Sacroiliitis, not elsewhere classified: Secondary | ICD-10-CM | POA: Diagnosis not present

## 2016-10-10 DIAGNOSIS — M545 Low back pain: Secondary | ICD-10-CM | POA: Diagnosis not present

## 2016-10-10 DIAGNOSIS — M791 Myalgia: Secondary | ICD-10-CM | POA: Diagnosis not present

## 2016-10-10 DIAGNOSIS — M461 Sacroiliitis, not elsewhere classified: Secondary | ICD-10-CM | POA: Diagnosis not present

## 2016-10-10 DIAGNOSIS — M5137 Other intervertebral disc degeneration, lumbosacral region: Secondary | ICD-10-CM | POA: Diagnosis not present

## 2016-10-10 DIAGNOSIS — M5127 Other intervertebral disc displacement, lumbosacral region: Secondary | ICD-10-CM | POA: Diagnosis not present

## 2016-10-13 DIAGNOSIS — M461 Sacroiliitis, not elsewhere classified: Secondary | ICD-10-CM | POA: Diagnosis not present

## 2016-10-13 DIAGNOSIS — M545 Low back pain: Secondary | ICD-10-CM | POA: Diagnosis not present

## 2016-10-13 DIAGNOSIS — M4722 Other spondylosis with radiculopathy, cervical region: Secondary | ICD-10-CM | POA: Diagnosis not present

## 2016-10-13 DIAGNOSIS — M5126 Other intervertebral disc displacement, lumbar region: Secondary | ICD-10-CM | POA: Diagnosis not present

## 2016-10-13 DIAGNOSIS — M791 Myalgia: Secondary | ICD-10-CM | POA: Diagnosis not present

## 2016-10-15 DIAGNOSIS — H35352 Cystoid macular degeneration, left eye: Secondary | ICD-10-CM | POA: Diagnosis not present

## 2016-10-15 DIAGNOSIS — H35371 Puckering of macula, right eye: Secondary | ICD-10-CM | POA: Diagnosis not present

## 2016-10-15 DIAGNOSIS — H33002 Unspecified retinal detachment with retinal break, left eye: Secondary | ICD-10-CM | POA: Diagnosis not present

## 2016-10-15 DIAGNOSIS — H2702 Aphakia, left eye: Secondary | ICD-10-CM | POA: Diagnosis not present

## 2016-10-15 DIAGNOSIS — H33052 Total retinal detachment, left eye: Secondary | ICD-10-CM | POA: Diagnosis not present

## 2016-10-17 DIAGNOSIS — M461 Sacroiliitis, not elsewhere classified: Secondary | ICD-10-CM | POA: Diagnosis not present

## 2016-10-17 DIAGNOSIS — M4722 Other spondylosis with radiculopathy, cervical region: Secondary | ICD-10-CM | POA: Diagnosis not present

## 2016-10-17 DIAGNOSIS — M5126 Other intervertebral disc displacement, lumbar region: Secondary | ICD-10-CM | POA: Diagnosis not present

## 2016-10-17 DIAGNOSIS — M791 Myalgia: Secondary | ICD-10-CM | POA: Diagnosis not present

## 2016-10-20 DIAGNOSIS — R202 Paresthesia of skin: Secondary | ICD-10-CM | POA: Diagnosis not present

## 2016-10-20 DIAGNOSIS — M4726 Other spondylosis with radiculopathy, lumbar region: Secondary | ICD-10-CM | POA: Diagnosis not present

## 2016-10-22 DIAGNOSIS — M25561 Pain in right knee: Secondary | ICD-10-CM | POA: Diagnosis not present

## 2016-10-22 DIAGNOSIS — M5126 Other intervertebral disc displacement, lumbar region: Secondary | ICD-10-CM | POA: Diagnosis not present

## 2016-10-22 DIAGNOSIS — M1711 Unilateral primary osteoarthritis, right knee: Secondary | ICD-10-CM | POA: Diagnosis not present

## 2016-10-22 DIAGNOSIS — M461 Sacroiliitis, not elsewhere classified: Secondary | ICD-10-CM | POA: Diagnosis not present

## 2016-10-22 DIAGNOSIS — M791 Myalgia: Secondary | ICD-10-CM | POA: Diagnosis not present

## 2016-10-22 DIAGNOSIS — M4722 Other spondylosis with radiculopathy, cervical region: Secondary | ICD-10-CM | POA: Diagnosis not present

## 2016-10-24 DIAGNOSIS — M4722 Other spondylosis with radiculopathy, cervical region: Secondary | ICD-10-CM | POA: Diagnosis not present

## 2016-10-24 DIAGNOSIS — M791 Myalgia: Secondary | ICD-10-CM | POA: Diagnosis not present

## 2016-10-24 DIAGNOSIS — M461 Sacroiliitis, not elsewhere classified: Secondary | ICD-10-CM | POA: Diagnosis not present

## 2016-10-24 DIAGNOSIS — M5126 Other intervertebral disc displacement, lumbar region: Secondary | ICD-10-CM | POA: Diagnosis not present

## 2016-10-27 DIAGNOSIS — M791 Myalgia: Secondary | ICD-10-CM | POA: Diagnosis not present

## 2016-10-27 DIAGNOSIS — M1712 Unilateral primary osteoarthritis, left knee: Secondary | ICD-10-CM | POA: Diagnosis not present

## 2016-10-27 DIAGNOSIS — M25562 Pain in left knee: Secondary | ICD-10-CM | POA: Diagnosis not present

## 2016-10-27 DIAGNOSIS — M461 Sacroiliitis, not elsewhere classified: Secondary | ICD-10-CM | POA: Diagnosis not present

## 2016-10-27 DIAGNOSIS — M4722 Other spondylosis with radiculopathy, cervical region: Secondary | ICD-10-CM | POA: Diagnosis not present

## 2016-10-27 DIAGNOSIS — M5126 Other intervertebral disc displacement, lumbar region: Secondary | ICD-10-CM | POA: Diagnosis not present

## 2016-10-29 DIAGNOSIS — M5126 Other intervertebral disc displacement, lumbar region: Secondary | ICD-10-CM | POA: Diagnosis not present

## 2016-10-29 DIAGNOSIS — M4722 Other spondylosis with radiculopathy, cervical region: Secondary | ICD-10-CM | POA: Diagnosis not present

## 2016-10-29 DIAGNOSIS — M791 Myalgia: Secondary | ICD-10-CM | POA: Diagnosis not present

## 2016-10-29 DIAGNOSIS — M461 Sacroiliitis, not elsewhere classified: Secondary | ICD-10-CM | POA: Diagnosis not present

## 2016-10-29 DIAGNOSIS — M1711 Unilateral primary osteoarthritis, right knee: Secondary | ICD-10-CM | POA: Diagnosis not present

## 2016-10-29 DIAGNOSIS — M25561 Pain in right knee: Secondary | ICD-10-CM | POA: Diagnosis not present

## 2016-11-03 DIAGNOSIS — M461 Sacroiliitis, not elsewhere classified: Secondary | ICD-10-CM | POA: Diagnosis not present

## 2016-11-03 DIAGNOSIS — M791 Myalgia: Secondary | ICD-10-CM | POA: Diagnosis not present

## 2016-11-03 DIAGNOSIS — M1712 Unilateral primary osteoarthritis, left knee: Secondary | ICD-10-CM | POA: Diagnosis not present

## 2016-11-03 DIAGNOSIS — M5126 Other intervertebral disc displacement, lumbar region: Secondary | ICD-10-CM | POA: Diagnosis not present

## 2016-11-03 DIAGNOSIS — M4722 Other spondylosis with radiculopathy, cervical region: Secondary | ICD-10-CM | POA: Diagnosis not present

## 2016-11-03 DIAGNOSIS — M25562 Pain in left knee: Secondary | ICD-10-CM | POA: Diagnosis not present

## 2016-11-05 DIAGNOSIS — M25561 Pain in right knee: Secondary | ICD-10-CM | POA: Diagnosis not present

## 2016-11-05 DIAGNOSIS — M1711 Unilateral primary osteoarthritis, right knee: Secondary | ICD-10-CM | POA: Diagnosis not present

## 2016-11-05 DIAGNOSIS — M5126 Other intervertebral disc displacement, lumbar region: Secondary | ICD-10-CM | POA: Diagnosis not present

## 2016-11-05 DIAGNOSIS — M791 Myalgia: Secondary | ICD-10-CM | POA: Diagnosis not present

## 2016-11-05 DIAGNOSIS — M4722 Other spondylosis with radiculopathy, cervical region: Secondary | ICD-10-CM | POA: Diagnosis not present

## 2016-11-05 DIAGNOSIS — M461 Sacroiliitis, not elsewhere classified: Secondary | ICD-10-CM | POA: Diagnosis not present

## 2016-11-07 DIAGNOSIS — M5126 Other intervertebral disc displacement, lumbar region: Secondary | ICD-10-CM | POA: Diagnosis not present

## 2016-11-07 DIAGNOSIS — M791 Myalgia: Secondary | ICD-10-CM | POA: Diagnosis not present

## 2016-11-07 DIAGNOSIS — M461 Sacroiliitis, not elsewhere classified: Secondary | ICD-10-CM | POA: Diagnosis not present

## 2016-11-07 DIAGNOSIS — M4722 Other spondylosis with radiculopathy, cervical region: Secondary | ICD-10-CM | POA: Diagnosis not present

## 2016-11-10 DIAGNOSIS — M1712 Unilateral primary osteoarthritis, left knee: Secondary | ICD-10-CM | POA: Diagnosis not present

## 2016-11-10 DIAGNOSIS — M791 Myalgia: Secondary | ICD-10-CM | POA: Diagnosis not present

## 2016-11-10 DIAGNOSIS — M461 Sacroiliitis, not elsewhere classified: Secondary | ICD-10-CM | POA: Diagnosis not present

## 2016-11-10 DIAGNOSIS — M4722 Other spondylosis with radiculopathy, cervical region: Secondary | ICD-10-CM | POA: Diagnosis not present

## 2016-11-10 DIAGNOSIS — M25562 Pain in left knee: Secondary | ICD-10-CM | POA: Diagnosis not present

## 2016-11-10 DIAGNOSIS — M5126 Other intervertebral disc displacement, lumbar region: Secondary | ICD-10-CM | POA: Diagnosis not present

## 2016-11-12 DIAGNOSIS — M25561 Pain in right knee: Secondary | ICD-10-CM | POA: Diagnosis not present

## 2016-11-12 DIAGNOSIS — M461 Sacroiliitis, not elsewhere classified: Secondary | ICD-10-CM | POA: Diagnosis not present

## 2016-11-12 DIAGNOSIS — M5126 Other intervertebral disc displacement, lumbar region: Secondary | ICD-10-CM | POA: Diagnosis not present

## 2016-11-12 DIAGNOSIS — M791 Myalgia: Secondary | ICD-10-CM | POA: Diagnosis not present

## 2016-11-12 DIAGNOSIS — M1711 Unilateral primary osteoarthritis, right knee: Secondary | ICD-10-CM | POA: Diagnosis not present

## 2016-11-12 DIAGNOSIS — M4722 Other spondylosis with radiculopathy, cervical region: Secondary | ICD-10-CM | POA: Diagnosis not present

## 2016-11-14 DIAGNOSIS — M25562 Pain in left knee: Secondary | ICD-10-CM | POA: Diagnosis not present

## 2016-11-14 DIAGNOSIS — M1712 Unilateral primary osteoarthritis, left knee: Secondary | ICD-10-CM | POA: Diagnosis not present

## 2016-11-19 DIAGNOSIS — M25561 Pain in right knee: Secondary | ICD-10-CM | POA: Diagnosis not present

## 2016-11-19 DIAGNOSIS — M1711 Unilateral primary osteoarthritis, right knee: Secondary | ICD-10-CM | POA: Diagnosis not present

## 2016-11-26 DIAGNOSIS — M1712 Unilateral primary osteoarthritis, left knee: Secondary | ICD-10-CM | POA: Diagnosis not present

## 2016-11-26 DIAGNOSIS — M25562 Pain in left knee: Secondary | ICD-10-CM | POA: Diagnosis not present

## 2016-12-01 ENCOUNTER — Ambulatory Visit (INDEPENDENT_AMBULATORY_CARE_PROVIDER_SITE_OTHER): Payer: BLUE CROSS/BLUE SHIELD | Admitting: Family Medicine

## 2016-12-01 ENCOUNTER — Ambulatory Visit (INDEPENDENT_AMBULATORY_CARE_PROVIDER_SITE_OTHER)
Admission: RE | Admit: 2016-12-01 | Discharge: 2016-12-01 | Disposition: A | Discharge status: Home / Self Care | Payer: BLUE CROSS/BLUE SHIELD | Source: Ambulatory Visit | Attending: Family Medicine | Admitting: Family Medicine

## 2016-12-01 ENCOUNTER — Encounter: Payer: Self-pay | Admitting: Family Medicine

## 2016-12-01 VITALS — BP 144/98 | HR 104 | Temp 98.2°F | Wt 259.2 lb

## 2016-12-01 DIAGNOSIS — R05 Cough: Secondary | ICD-10-CM

## 2016-12-01 DIAGNOSIS — R052 Subacute cough: Secondary | ICD-10-CM

## 2016-12-01 MED ORDER — PREDNISONE 20 MG PO TABS: 20.0000 mg | ORAL_TABLET | Freq: Every day | ORAL | 0 refills | Status: DC

## 2016-12-01 NOTE — Patient Instructions (Signed)
Please go to [facility] for chest x-ray - located [address] across [address] from [place] - in the basement - Hours: 8:30-5:30 PM M-F. Do not need appointment.   If negative- likely 7 day trial of prednisone. If still not clearing- may consider antibiotic trial as worked last time but exam a little different today

## 2016-12-01 NOTE — Progress Notes (Signed)
Subjective:  Alan Tyler is a 60 y.o. year old very pleasant male patient who presents for/with See problem oriented charting ROS- no fever, chest pain. No runny nose sore throat anymore. Does wheeze some and SOB if prolonged coughing fit.   Past Medical History-  Patient Active Problem List   Diagnosis Date Noted  . ADD (attention deficit disorder) 04/28/2014  . Personal history of colonic polyps 03/21/2014  . PVC (premature ventricular contraction) 01/26/2013  . Chest pain, atypical 01/26/2013  . Heart palpitations 01/18/2013  . Abnormal EKG 01/18/2013  . History of nephrectomy 01/18/2013  . History of kidney cancer 01/18/2013  . Depression 06/07/2012    Medications- reviewed and updated Current Outpatient Prescriptions  Medication Sig Dispense Refill  . ALPRAZolam (XANAX) 0.5 MG tablet Take 1 tablet (0.5 mg total) by mouth daily as needed. 30 tablet 2  . amphetamine-dextroamphetamine (ADDERALL XR) 20 MG 24 hr capsule Take 1 capsule (20 mg total) by mouth daily. 30 capsule 0  . amphetamine-dextroamphetamine (ADDERALL XR) 20 MG 24 hr capsule Take 1 capsule (20 mg total) by mouth daily. 30 capsule 0  . amphetamine-dextroamphetamine (ADDERALL XR) 20 MG 24 hr capsule Take 1 capsule (20 mg total) by mouth daily. 30 capsule 0  . amphetamine-dextroamphetamine (ADDERALL) 10 MG tablet Take 1 tablet (10 mg total) by mouth 2 (two) times daily. 60 tablet 0  . amphetamine-dextroamphetamine (ADDERALL) 10 MG tablet Take 1 tablet (10 mg total) by mouth 2 (two) times daily. 60 tablet 0  . amphetamine-dextroamphetamine (ADDERALL) 10 MG tablet Take 1 tablet (10 mg total) by mouth 2 (two) times daily. 60 tablet 0  . aspirin 81 MG tablet Take 81 mg by mouth daily.      Marland Kitchen buPROPion (WELLBUTRIN XL) 300 MG 24 hr tablet TAKE 1 TABLET EVERY MORNING 90 tablet 1  . ibuprofen (ADVIL,MOTRIN) 200 MG tablet Take 200 mg by mouth every 6 (six) hours as needed.      Marland Kitchen ketoconazole (NIZORAL) 2 % shampoo APP EXT 3  TIMES A WEEK UTD  1  . ketorolac (ACULAR) 0.5 % ophthalmic solution INSTILL 1 DROP INTO THE LEFT EYE QID  2  . Omega-3 Fatty Acids (OMEGA 3 PO) Take by mouth. Take 3,300mg  (2 capsules) daily    . OVER THE COUNTER MEDICATION NATURAL PSYLIUM HUSK FIBER 0.52 grams,(5Capsules) daily    . predniSONE (DELTASONE) 20 MG tablet Take 1 tablet (20 mg total) by mouth daily with breakfast. 7 tablet 0   No current facility-administered medications for this visit.     Objective: BP (!) 144/98 (BP Location: Left Arm, Patient Position: Sitting, Cuff Size: Large)   Pulse (!) 104   Temp 98.2 F (36.8 C) (Oral)   Wt 259 lb 3.2 oz (117.6 kg)   SpO2 93%   BMI 40.90 kg/m  Gen: NAD, resting comfortably TM normal, oropharynx normal, nares mildly erythematous CV: RRR no murmurs rubs or gallops Lungs: CTAB no crackles, wheeze, rhonchi Ext: no edema, or calf tenderness Skin: warm, dry  Assessment/Plan:  Subacute cough S:started with URI symptoms in middle of October. Cold symptoms resolved within 3 weeks but cough persisted since then. Quit smoking 2003. Mildly productive thick yellow sputum- othertimes dry, worse in AM. Mld SOb with coughing only- does get into some fits. Wheezing some but not at present. Has trialed robitussen without relief. Rates symptoms as moderate in intensity for cough for most part.  A/P: given former smoker and 6 weeks of cough- x-ray obtained which was negative  for acute cause. There have been notes about possible COPD including 03/05/16 treatment for copd exacerbation at time. Will trial 7 day prednisone for copd exacerbation mild vs. Post viral cough- return to care if does not improve.   BP not typically elevated- avoid decongestants. May be due to him not feeling well- suspect prednisone will not cause more significant rise.   Orders Placed This Encounter  Procedures  . DG Chest 2 View    Standing Status:   Future    Number of Occurrences:   1    Standing Expiration Date:    02/01/2018    Order Specific Question:   Reason for Exam (SYMPTOM  OR DIAGNOSIS REQUIRED)    Answer:   cough for 6 weeks, suspect copd, quit smoking 2003.    Order Specific Question:   Preferred imaging location?    Answer:   [facility]    Meds ordered this encounter  Medications  . predniSONE (DELTASONE) 20 MG tablet    Sig: Take 1 tablet (20 mg total) by mouth daily with breakfast.    Dispense:  7 tablet    Refill:  0  new acute issue with medication management  Return precautions advised.  Garret Reddish, MD

## 2016-12-01 NOTE — Progress Notes (Signed)
Pre visit review using our clinic review tool, if applicable. No additional management support is needed unless otherwise documented below in the visit note. 

## 2016-12-03 DIAGNOSIS — M1711 Unilateral primary osteoarthritis, right knee: Secondary | ICD-10-CM | POA: Diagnosis not present

## 2016-12-03 DIAGNOSIS — M25561 Pain in right knee: Secondary | ICD-10-CM | POA: Diagnosis not present

## 2016-12-10 DIAGNOSIS — M1712 Unilateral primary osteoarthritis, left knee: Secondary | ICD-10-CM | POA: Diagnosis not present

## 2016-12-10 DIAGNOSIS — M25562 Pain in left knee: Secondary | ICD-10-CM | POA: Diagnosis not present

## 2016-12-17 DIAGNOSIS — M25561 Pain in right knee: Secondary | ICD-10-CM | POA: Diagnosis not present

## 2016-12-17 DIAGNOSIS — M1711 Unilateral primary osteoarthritis, right knee: Secondary | ICD-10-CM | POA: Diagnosis not present

## 2016-12-24 DIAGNOSIS — M25562 Pain in left knee: Secondary | ICD-10-CM | POA: Diagnosis not present

## 2016-12-24 DIAGNOSIS — M1712 Unilateral primary osteoarthritis, left knee: Secondary | ICD-10-CM | POA: Diagnosis not present

## 2016-12-24 DIAGNOSIS — M25561 Pain in right knee: Secondary | ICD-10-CM | POA: Diagnosis not present

## 2016-12-24 DIAGNOSIS — M1711 Unilateral primary osteoarthritis, right knee: Secondary | ICD-10-CM | POA: Diagnosis not present

## 2016-12-24 DIAGNOSIS — M2351 Chronic instability of knee, right knee: Secondary | ICD-10-CM | POA: Diagnosis not present

## 2016-12-24 DIAGNOSIS — M791 Myalgia: Secondary | ICD-10-CM | POA: Diagnosis not present

## 2017-01-13 ENCOUNTER — Other Ambulatory Visit: Payer: Self-pay | Admitting: Internal Medicine

## 2017-01-13 ENCOUNTER — Telehealth: Payer: Self-pay | Admitting: Internal Medicine

## 2017-01-13 MED ORDER — AMPHETAMINE-DEXTROAMPHET ER 20 MG PO CP24: 20.0000 mg | ORAL_CAPSULE | Freq: Every day | ORAL | 0 refills | Status: DC

## 2017-01-13 MED ORDER — AMPHETAMINE-DEXTROAMPHETAMINE 10 MG PO TABS: 10.0000 mg | ORAL_TABLET | Freq: Two times a day (BID) | ORAL | 0 refills | Status: DC

## 2017-01-13 NOTE — Telephone Encounter (Signed)
Pt needs new rxs for generic adderall xr 20 mg and generic adderall 10 mg

## 2017-01-13 NOTE — Telephone Encounter (Signed)
Pt notified Rx ready for pickup. Rx printed and signed.  

## 2017-02-20 ENCOUNTER — Other Ambulatory Visit: Payer: Self-pay | Admitting: Internal Medicine

## 2017-03-31 ENCOUNTER — Other Ambulatory Visit: Payer: BLUE CROSS/BLUE SHIELD

## 2017-04-06 ENCOUNTER — Encounter: Payer: BLUE CROSS/BLUE SHIELD | Admitting: Internal Medicine

## 2017-04-08 ENCOUNTER — Ambulatory Visit (INDEPENDENT_AMBULATORY_CARE_PROVIDER_SITE_OTHER): Payer: BLUE CROSS/BLUE SHIELD | Admitting: Internal Medicine

## 2017-04-08 ENCOUNTER — Encounter: Payer: Self-pay | Admitting: Internal Medicine

## 2017-04-08 VITALS — BP 124/68 | HR 65 | Temp 98.0°F | Ht 66.0 in | Wt 249.0 lb

## 2017-04-08 DIAGNOSIS — Z0000 Encounter for general adult medical examination without abnormal findings: Secondary | ICD-10-CM | POA: Diagnosis not present

## 2017-04-08 LAB — CBC WITH DIFFERENTIAL/PLATELET
Basophils Absolute: 0 10*3/uL (ref 0.0–0.1)
Basophils Relative: 0.5 % (ref 0.0–3.0)
Eosinophils Absolute: 0.4 10*3/uL (ref 0.0–0.7)
Eosinophils Relative: 5.1 % — ABNORMAL HIGH (ref 0.0–5.0)
HCT: 44.2 % (ref 39.0–52.0)
Hemoglobin: 15 g/dL (ref 13.0–17.0)
Lymphocytes Relative: 20.4 % (ref 12.0–46.0)
Lymphs Abs: 1.8 10*3/uL (ref 0.7–4.0)
MCHC: 34 g/dL (ref 30.0–36.0)
MCV: 87.6 fl (ref 78.0–100.0)
Monocytes Absolute: 0.7 10*3/uL (ref 0.1–1.0)
Monocytes Relative: 8.5 % (ref 3.0–12.0)
Neutro Abs: 5.7 10*3/uL (ref 1.4–7.7)
Neutrophils Relative %: 65.5 % (ref 43.0–77.0)
Platelets: 218 10*3/uL (ref 150.0–400.0)
RBC: 5.05 Mil/uL (ref 4.22–5.81)
RDW: 13.8 % (ref 11.5–15.5)
WBC: 8.7 10*3/uL (ref 4.0–10.5)

## 2017-04-08 LAB — COMPREHENSIVE METABOLIC PANEL
ALT: 21 U/L (ref 0–53)
AST: 17 U/L (ref 0–37)
Albumin: 4.1 g/dL (ref 3.5–5.2)
Alkaline Phosphatase: 73 U/L (ref 39–117)
BUN: 18 mg/dL (ref 6–23)
CO2: 23 mEq/L (ref 19–32)
Calcium: 9 mg/dL (ref 8.4–10.5)
Chloride: 104 mEq/L (ref 96–112)
Creatinine, Ser: 1.47 mg/dL (ref 0.40–1.50)
GFR: 51.9 mL/min — ABNORMAL LOW (ref >60.00)
Glucose, Bld: 99 mg/dL (ref 70–99)
Potassium: 4 mEq/L (ref 3.5–5.1)
Sodium: 138 mEq/L (ref 135–145)
Total Bilirubin: 0.8 mg/dL (ref 0.2–1.2)
Total Protein: 6.8 g/dL (ref 6.0–8.3)

## 2017-04-08 LAB — LIPID PANEL
Cholesterol: 192 mg/dL (ref 0–200)
HDL: 38 mg/dL — ABNORMAL LOW (ref >39.00)
LDL Cholesterol: 133 mg/dL — ABNORMAL HIGH (ref 0–99)
NonHDL: 154.16
Total CHOL/HDL Ratio: 5
Triglycerides: 105 mg/dL (ref 0.0–149.0)
VLDL: 21 mg/dL (ref 0.0–40.0)

## 2017-04-08 LAB — TSH: TSH: 2.68 u[IU]/mL (ref 0.35–4.50)

## 2017-04-08 NOTE — Progress Notes (Signed)
Pre visit review using our clinic review tool, if applicable. No additional management support is needed unless otherwise documented below in the visit note. 

## 2017-04-08 NOTE — Progress Notes (Signed)
Subjective:    Patient ID: Alan Tyler, male    DOB: 20-Mar-1956, 61 y.o.   MRN: [id number]  HPI  61 year old patient who is seen today for a preventive health examination He is doing well.  There is been some modest weight loss over the past year.  He does have a history of impaired glucose tolerance.  He has depression, which has been stable He is status post nephrectomy for renal cancer he has a history colonic polyps and  His last colonoscopy was in 2014.  He also had a stress echocardiogram performed at that time.  Doing quite well, attempting to lose weight and is exercising regularly and monitoring his caloric intake. He has ADD, which has been stable  He has been followed by Dr. Zadie Rhine in the past for retinal disease OS and is essentially blind in the left eye  Past Medical History:  Diagnosis Date  . Arthritis   . Cancer [facility])    kidney - 2003  . Chronic kidney disease    kidney stones and (R) nephrectomy  . Colon polyps   . Depression   . GERD (gastroesophageal reflux disease)   . Hyperlipidemia   . PVC's (premature ventricular contractions)    arrhythmia     Social History   Social History  . Marital status: Married    Spouse name: N/A  . Number of children: 3  . 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 Smoking status: Former Smoker    Quit date: 12/29/2001  . Smokeless tobacco: Former Systems developer    Quit date: 12/27/2002  . Alcohol use Yes     Comment: rarely  . Drug use: No  . Sexual activity: Not on file   Other Topics Concern  . Not on file   Social History Narrative  . No narrative on file    Past Surgical History:  Procedure Laterality Date  . CATARACT EXTRACTION     right eye  . NEPHRECTOMY  2003   (R) for cancer tumor  . RETINAL DETACHMENT SURGERY  2012    Family History  Problem Relation Age of Onset  . Arthritis Mother   . Depression Mother   . Colon  cancer Mother     age 48 part of colon was removed  . Alcohol abuse Father   . Heart disease Father   . Hyperlipidemia Father   . Hyperlipidemia Maternal Grandmother   . Hyperlipidemia Maternal Grandfather   . Hyperlipidemia Paternal Grandmother   . Diabetes Paternal Grandmother   . Arthritis Paternal Grandfather   . Hyperlipidemia Paternal Grandfather   . Stomach cancer Neg Hx   . Esophageal cancer Neg Hx   . Rectal cancer Neg Hx     Allergies  Allergen Reactions  . Sulfa Drugs Cross Reactors     Per pt: unknown    Current Outpatient Prescriptions on File Prior to Visit  Medication Sig Dispense Refill  . ALPRAZolam (XANAX) 0.5 MG tablet Take 1 tablet (0.5 mg total) by mouth daily as needed. 30 tablet 2  . amphetamine-dextroamphetamine (ADDERALL XR) 20 MG 24 hr capsule Take 1 capsule (20 mg total) by mouth daily. 30 capsule 0  . amphetamine-dextroamphetamine (ADDERALL XR) 20 MG 24 hr capsule Take 1 capsule (20 mg total) by mouth daily. 30 capsule 0  . amphetamine-dextroamphetamine (ADDERALL XR) 20 MG 24 hr capsule Take 1 capsule (20 mg total) by mouth daily. [place]  capsule 0  . amphetamine-dextroamphetamine (ADDERALL) 10 MG tablet Take 1 tablet (10 mg total) by mouth 2 (two) times daily. 60 tablet 0  . amphetamine-dextroamphetamine (ADDERALL) 10 MG tablet Take 1 tablet (10 mg total) by mouth 2 (two) times daily. 60 tablet 0  . amphetamine-dextroamphetamine (ADDERALL) 10 MG tablet Take 1 tablet (10 mg total) by mouth 2 (two) times daily. 60 tablet 0  . aspirin 81 MG tablet Take 81 mg by mouth daily.      Marland Kitchen buPROPion (WELLBUTRIN XL) 300 MG 24 hr tablet TAKE 1 TABLET EVERY MORNING 90 tablet 1  . ibuprofen (ADVIL,MOTRIN) 200 MG tablet Take 200 mg by mouth every 6 (six) hours as needed.      . Omega-3 Fatty Acids (OMEGA 3 PO) Take by mouth. Take 3,300mg  (2 capsules) daily    . OVER THE COUNTER MEDICATION NATURAL PSYLIUM HUSK FIBER 0.52 grams,(5Capsules) daily     No current  facility-administered medications on file prior to visit.     BP 124/68 (BP Location: Left Arm, Patient Position: Sitting, Cuff Size: Normal)   Pulse 65   Temp 98 F (36.7 C) (Oral)   Ht 5\' 6"  (1.676 m)   Wt 249 lb (112.9 kg)   SpO2 98%   BMI 40.19 kg/m     Review of Systems  Constitutional: Negative for activity change, appetite change, chills, fatigue and fever.  HENT: Negative for congestion, dental problem, ear pain, hearing loss, mouth sores, rhinorrhea, sinus pressure, sneezing, tinnitus, trouble swallowing and voice change.   Eyes: Positive for visual disturbance. Negative for photophobia, pain and redness.  Respiratory: Negative for apnea, cough, choking, chest tightness, shortness of breath and wheezing.   Cardiovascular: Negative for chest pain, palpitations and leg swelling.  Gastrointestinal: Negative for abdominal distention, abdominal pain, anal bleeding, blood in stool, constipation, diarrhea, nausea, rectal pain and vomiting.  Genitourinary: Negative for decreased urine volume, difficulty urinating, discharge, dysuria, flank pain, frequency, genital sores, hematuria, penile swelling, scrotal swelling, testicular pain and urgency.  Musculoskeletal: Negative for arthralgias, back pain, gait problem, joint swelling, myalgias, neck pain and neck stiffness.  Skin: Negative for color change, rash and wound.  Neurological: Negative for dizziness, tremors, seizures, syncope, facial asymmetry, speech difficulty, weakness, light-headedness, numbness and headaches.  Hematological: Negative for adenopathy. Does not bruise/bleed easily.  Psychiatric/Behavioral: Negative for agitation, behavioral problems, confusion, decreased concentration, dysphoric mood, hallucinations, self-injury, sleep disturbance and suicidal ideas. The patient is not nervous/anxious.        Objective:   Physical Exam  Constitutional: He appears well-developed and well-nourished.  Weight 249 Blood pressure  110/70  HENT:  Head: Normocephalic and atraumatic.  Right Ear: External ear normal.  Left Ear: External ear normal.  Nose: Nose normal.  Mouth/Throat: Oropharynx is clear and moist.  Eyes: Conjunctivae and EOM are normal. Pupils are equal, round, and reactive to light. No scleral icterus.  Retinal scarring on the left  Neck: Normal range of motion. Neck supple. No JVD present. No thyromegaly present.  Cardiovascular: Regular rhythm, normal heart sounds and intact distal pulses.  Exam reveals no gallop and no friction rub.   No murmur heard. Pulmonary/Chest: Effort normal and breath sounds normal. He exhibits no tenderness.  Abdominal: Soft. Bowel sounds are normal. He exhibits no distension and no mass. There is no tenderness.  Lower midline surgical scar  Genitourinary: Prostate normal and penis normal.  Musculoskeletal: Normal range of motion. He exhibits no edema or tenderness.  Lymphadenopathy:    He has  no cervical adenopathy.  Neurological: He is alert. He has normal reflexes. No cranial nerve deficit. Coordination normal.  Skin: Skin is warm and dry. No rash noted.  Psychiatric: He has a normal mood and affect. His behavior is normal.          Assessment & Plan:   Preventive health care Exogenous obesity.  Will continue efforts at weight loss and exercise Impaired glucose tolerance History of renal cancer History of colonic polyps.  Follow-up colonoscopy one year Depression, stable ADHD stable  Follow-up 6-12 months Review lab  Nyoka Cowden

## 2017-04-08 NOTE — Patient Instructions (Signed)
Limit your sodium (Salt) intake    It is important that you exercise regularly, at least 20 minutes 3 to 4 times per week.  If you develop chest pain or shortness of breath seek  medical attention.  You need to lose weight.  Consider a lower calorie diet and regular exercise. 

## 2017-04-14 DIAGNOSIS — H3342 Traction detachment of retina, left eye: Secondary | ICD-10-CM | POA: Diagnosis not present

## 2017-04-14 DIAGNOSIS — H35371 Puckering of macula, right eye: Secondary | ICD-10-CM | POA: Diagnosis not present

## 2017-04-14 DIAGNOSIS — H33002 Unspecified retinal detachment with retinal break, left eye: Secondary | ICD-10-CM | POA: Diagnosis not present

## 2017-04-14 DIAGNOSIS — H35352 Cystoid macular degeneration, left eye: Secondary | ICD-10-CM | POA: Diagnosis not present

## 2017-04-14 DIAGNOSIS — H33052 Total retinal detachment, left eye: Secondary | ICD-10-CM | POA: Diagnosis not present

## 2017-04-22 ENCOUNTER — Telehealth: Payer: Self-pay | Admitting: Internal Medicine

## 2017-04-22 MED ORDER — AMPHETAMINE-DEXTROAMPHET ER 20 MG PO CP24: 20.0000 mg | ORAL_CAPSULE | Freq: Every day | ORAL | 0 refills | Status: DC

## 2017-04-22 MED ORDER — AMPHETAMINE-DEXTROAMPHETAMINE 10 MG PO TABS: 10.0000 mg | ORAL_TABLET | Freq: Two times a day (BID) | ORAL | 0 refills | Status: DC

## 2017-04-22 NOTE — Telephone Encounter (Signed)
Pt need new Rx for Adderall   Pt is aware of 3 business days for refills and someone will call when ready for pick up. °

## 2017-04-22 NOTE — Telephone Encounter (Signed)
Rx printed awaiting to be signed.  

## 2017-04-24 DIAGNOSIS — M170 Bilateral primary osteoarthritis of knee: Secondary | ICD-10-CM | POA: Diagnosis not present

## 2017-04-24 DIAGNOSIS — M5136 Other intervertebral disc degeneration, lumbar region: Secondary | ICD-10-CM | POA: Diagnosis not present

## 2017-04-24 DIAGNOSIS — M545 Low back pain: Secondary | ICD-10-CM | POA: Diagnosis not present

## 2017-04-24 NOTE — Telephone Encounter (Signed)
Pt notified Rx ready for pickup. Rx printed and signed.  

## 2017-05-25 DIAGNOSIS — M545 Low back pain: Secondary | ICD-10-CM | POA: Diagnosis not present

## 2017-05-25 DIAGNOSIS — M170 Bilateral primary osteoarthritis of knee: Secondary | ICD-10-CM | POA: Diagnosis not present

## 2017-05-25 DIAGNOSIS — M5136 Other intervertebral disc degeneration, lumbar region: Secondary | ICD-10-CM | POA: Diagnosis not present

## 2017-05-27 ENCOUNTER — Ambulatory Visit (INDEPENDENT_AMBULATORY_CARE_PROVIDER_SITE_OTHER): Payer: BLUE CROSS/BLUE SHIELD | Admitting: Adult Health

## 2017-05-27 ENCOUNTER — Encounter: Payer: Self-pay | Admitting: Adult Health

## 2017-05-27 VITALS — BP 138/80 | Temp 98.2°F | Ht 66.0 in | Wt 246.8 lb

## 2017-05-27 DIAGNOSIS — L03115 Cellulitis of right lower limb: Secondary | ICD-10-CM

## 2017-05-27 NOTE — Progress Notes (Signed)
Subjective:    Patient ID: Alan Tyler, male    DOB: Dec 19, 1956, 61 y.o.   MRN: [id number]  HPI  61 year old male who  has a past medical history of Arthritis; Cancer ([place]); Chronic kidney disease; Colon polyps; Depression; GERD (gastroesophageal reflux disease); Hyperlipidemia; and PVC's (premature ventricular contractions). He is a patient of Dr. Raliegh Ip who I am seeing today for an acute issue of possible cellulitis of right foot. He was seen by Dr. Gust Rung with [facility] who had done a cortisone injection into his right foot. 4 days ago he noticed redness, warmth, swelling and pain to right foot. He went back to Dr. Gust Rung who diagnosed him with cellulitis and prescribed him a course of Clindamycin. He reports that he has been taking the antibiotic for 1.5 days and has noticed improvement in the swelling, redness and pain.   He has been elevating foot as much as possible.   Denies any calf tenderness, CP, SOB   Review of Systems See HPI   Past Medical History:  Diagnosis Date  . Arthritis   . Cancer [facility])    kidney - 2003  . Chronic kidney disease    kidney stones and (R) nephrectomy  . Colon polyps   . Depression   . GERD (gastroesophageal reflux disease)   . Hyperlipidemia   . PVC's (premature ventricular contractions)    arrhythmia    Social History   Social History  . Marital status: Married    Spouse name: N/A  . Number of children: 3  . 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 Smoking status: Former Smoker    Quit date: 12/29/2001  . Smokeless tobacco: Former Systems developer    Quit date: 12/27/2002  . Alcohol use Yes     Comment: rarely  . Drug use: No  . Sexual activity: Not on file   Other Topics Concern  . Not on file   Social History Narrative  . No narrative on file    Past Surgical History:  Procedure Laterality Date  . CATARACT EXTRACTION     right eye  .  NEPHRECTOMY  2003   (R) for cancer tumor  . RETINAL DETACHMENT SURGERY  2012    Family History  Problem Relation Age of Onset  . Arthritis Mother   . Depression Mother   . Colon cancer Mother        age 62 part of colon was removed  . Alcohol abuse Father   . Heart disease Father   . Hyperlipidemia Father   . Hyperlipidemia Maternal Grandmother   . Hyperlipidemia Maternal Grandfather   . Hyperlipidemia Paternal Grandmother   . Diabetes Paternal Grandmother   . Arthritis Paternal Grandfather   . Hyperlipidemia Paternal Grandfather   . Stomach cancer Neg Hx   . Esophageal cancer Neg Hx   . Rectal cancer Neg Hx     Allergies  Allergen Reactions  . Sulfa Drugs Cross Reactors     Per pt: unknown    Current Outpatient Prescriptions on File Prior to Visit  Medication Sig Dispense Refill  . ALPRAZolam (XANAX) 0.5 MG tablet Take 1 tablet (0.5 mg total) by mouth daily as needed. 30 tablet 2  . amphetamine-dextroamphetamine (ADDERALL XR) 20 MG 24 hr capsule Take 1 capsule (20 mg total) by mouth daily. 30 capsule 0  . amphetamine-dextroamphetamine (ADDERALL XR) 20 MG 24 hr capsule Take 1  capsule (20 mg total) by mouth daily. 30 capsule 0  . amphetamine-dextroamphetamine (ADDERALL XR) 20 MG 24 hr capsule Take 1 capsule (20 mg total) by mouth daily. 30 capsule 0  . amphetamine-dextroamphetamine (ADDERALL) 10 MG tablet Take 1 tablet (10 mg total) by mouth 2 (two) times daily. 60 tablet 0  . amphetamine-dextroamphetamine (ADDERALL) 10 MG tablet Take 1 tablet (10 mg total) by mouth 2 (two) times daily. 60 tablet 0  . amphetamine-dextroamphetamine (ADDERALL) 10 MG tablet Take 1 tablet (10 mg total) by mouth 2 (two) times daily. 60 tablet 0  . aspirin 81 MG tablet Take 81 mg by mouth daily.      Marland Kitchen buPROPion (WELLBUTRIN XL) 300 MG 24 hr tablet TAKE 1 TABLET EVERY MORNING 90 tablet 1  . ibuprofen (ADVIL,MOTRIN) 200 MG tablet Take 200 mg by mouth every 6 (six) hours as needed.      . Omega-3  Fatty Acids (OMEGA 3 PO) Take by mouth. Take 3,300mg  (2 capsules) daily    . OVER THE COUNTER MEDICATION NATURAL PSYLIUM HUSK FIBER 0.52 grams,(5Capsules) daily     No current facility-administered medications on file prior to visit.     BP 138/80 (BP Location: Left Arm, Patient Position: Sitting, Cuff Size: Large)   Temp 98.2 F (36.8 C) (Oral)   Ht 5\' 6"  (1.676 m)   Wt 246 lb 12.8 oz (111.9 kg)   BMI 39.83 kg/m       Objective:   Physical Exam  Constitutional: He is oriented to person, place, and time. He appears well-developed and well-nourished. No distress.  Cardiovascular: Normal rate, regular rhythm, normal heart sounds and intact distal pulses.  Exam reveals no gallop and no friction rub.   No murmur heard. Pulmonary/Chest: Effort normal and breath sounds normal. No respiratory distress. He has no wheezes. He has no rales. He exhibits no tenderness.  Musculoskeletal: He exhibits edema and tenderness.  Redness, warmth and swelling to right foot.  No calf pain, warmth or redness noted.   Neurological: He is alert and oriented to person, place, and time.  Skin: Skin is warm and dry. He is not diaphoretic. There is erythema.  Psychiatric: He has a normal mood and affect. His behavior is normal. Judgment and thought content normal.  Nursing note and vitals reviewed.     Assessment & Plan:  1. Cellulitis of right foot - Exam consistent with cellulitis that is beginning to resolve.  - Continue to elevated leg as much as possible  - Continue antibiotics until finished - Follow up if not resolved - go to the ER with any calf pain, swelling, warmth, or redness  Dorothyann Peng, NP

## 2017-06-12 ENCOUNTER — Ambulatory Visit (INDEPENDENT_AMBULATORY_CARE_PROVIDER_SITE_OTHER): Payer: BLUE CROSS/BLUE SHIELD | Admitting: Family Medicine

## 2017-06-12 ENCOUNTER — Telehealth: Payer: Self-pay | Admitting: Internal Medicine

## 2017-06-12 ENCOUNTER — Ambulatory Visit (INDEPENDENT_AMBULATORY_CARE_PROVIDER_SITE_OTHER): Payer: BLUE CROSS/BLUE SHIELD

## 2017-06-12 ENCOUNTER — Encounter: Payer: Self-pay | Admitting: Family Medicine

## 2017-06-12 VITALS — BP 138/98 | HR 97 | Temp 97.8°F | Wt 252.2 lb

## 2017-06-12 DIAGNOSIS — M79672 Pain in left foot: Secondary | ICD-10-CM | POA: Diagnosis not present

## 2017-06-12 DIAGNOSIS — Z6841 Body Mass Index (BMI) 40.0 and over, adult: Secondary | ICD-10-CM | POA: Diagnosis not present

## 2017-06-12 LAB — URIC ACID: Uric Acid, Serum: 8.2 mg/dL — ABNORMAL HIGH (ref 4.0–7.8)

## 2017-06-12 LAB — HEMOGLOBIN A1C: Hgb A1c MFr Bld: 5.9 % (ref 4.6–6.5)

## 2017-06-12 MED ORDER — PREDNISONE 20 MG PO TABS: ORAL_TABLET | ORAL | 0 refills | Status: DC

## 2017-06-12 NOTE — Telephone Encounter (Signed)
Noted  

## 2017-06-12 NOTE — Telephone Encounter (Signed)
Patient Name: Alan Tyler DOB: 1956-06-03 Initial Comment Caller states, patient is having symptoms of: left foot is swollen and red, he suffers from cellulites. Nurse Assessment Nurse: Yolanda Bonine, RN, Erin Date/Time (Eastern Time): 06/12/2017 10:06:02 AM Confirm and document reason for call. If symptomatic, describe symptoms. ---Caller states,: left foot is swollen and red around big toe and joint. No fever, 8/10 pain rating Does the patient have any new or worsening symptoms? ---Yes Will a triage be completed? ---Yes Related visit to physician within the last 2 weeks? ---No Does the PT have any chronic conditions? (i.e. diabetes, asthma, etc.) ---Yes List chronic conditions. ---ADD Is this a behavioral health or substance abuse call? ---No Guidelines Guideline Title Affirmed Question Affirmed Notes Foot Pain [1] Redness of the skin AND [2] no fever Final Disposition User See Physician within 24 Hours [place], RN, Junie Panning Comments pt stated he had cellulitis in Right foot 3 weeks ago. Was on ABX that cleared it up TOE IS SWOLLEN, JOINT.Marland KitchenMarland KitchenNOT THE ENTIRE FOOT NO appt available with PCP or at Primary office. Appt scheduled for 1pm with Tor Netters NP at [facility]. Referrals REFERRED TO PCP OFFICE Disagree/Comply: Comply

## 2017-06-12 NOTE — Patient Instructions (Signed)

## 2017-06-12 NOTE — Progress Notes (Signed)
Subjective:    Patient ID: Alan Tyler, male    DOB: 1956/03/12, 61 y.o.   MRN: [id number]  HPI This is a 61 yo male who has had left toe pain x 2 days. Area red,pain under great toe. No streaking, no calf pain, no fever. Never with previous gout. Has been taking a couple of ibuprofen without relief of pain. Pain 8/10. 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 Does not recall any trauma. Denies elevated glucose reading.  Had cellulitis in right foot last month following a steroid injection, this does not feel similar.    Past Medical History:  Diagnosis Date  . Arthritis   . Cancer [facility])    kidney - 2003  . Chronic kidney disease    kidney stones and (R) nephrectomy  . Colon polyps   . Depression   . GERD (gastroesophageal reflux disease)   . Hyperlipidemia   . PVC's (premature ventricular contractions)    arrhythmia   Past Surgical History:  Procedure Laterality Date  . CATARACT EXTRACTION     right eye  . NEPHRECTOMY  2003   (R) for cancer tumor  . RETINAL DETACHMENT SURGERY  2012   Family History  Problem Relation Age of Onset  . Arthritis Mother   . Depression Mother   . Colon cancer Mother        age 7 part of colon was removed  . Alcohol abuse Father   . Heart disease Father   . Hyperlipidemia Father   . Hyperlipidemia Maternal Grandmother   . Hyperlipidemia Maternal Grandfather   . Hyperlipidemia Paternal Grandmother   . Diabetes Paternal Grandmother   . Arthritis Paternal Grandfather   . Hyperlipidemia Paternal Grandfather   . Stomach cancer Neg Hx   . Esophageal cancer Neg Hx   . Rectal cancer Neg Hx    Social History  Substance Use Topics  . Smoking status: Former Smoker    Quit date: 12/29/2001  . Smokeless tobacco: Former Systems developer    Quit date: 12/27/2002  . Alcohol use Yes     Comment: rarely      Review of Systems Per HPI    Objective:   Physical Exam  Constitutional: He is oriented to person, place, and time. He appears  well-developed and well-nourished. No distress.  Obese.   HENT:  Head: Normocephalic and atraumatic.  Eyes: Conjunctivae are normal.  Cardiovascular: Normal rate.   Pulmonary/Chest: Effort normal.  Musculoskeletal:       Feet:  Neurological: He is alert and oriented to person, place, and time.  Skin: Skin is warm and dry. He is not diaphoretic.  Psychiatric: He has a normal mood and affect. His behavior is normal. Judgment and thought content normal.  Vitals reviewed.     BP (!) 148/98 (BP Location: Right Arm, Patient Position: Sitting, Cuff Size: Large)   Pulse 97   Temp 97.8 F (36.6 C) (Oral)   Wt 252 lb 3.2 oz (114.4 kg)   SpO2 97%   BMI 40.71 kg/m  Wt Readings from Last 3 Encounters:  06/12/17 252 lb 3.2 oz (114.4 kg)  05/27/17 246 lb 12.8 oz (111.9 kg)  04/08/17 249 lb (112.9 kg)   BP Readings from Last 3 Encounters:  06/12/17 (!) 138/98  05/27/17 138/80  04/08/17 124/68       Assessment & Plan:  1. Left foot pain - does not appear infected, suspect gout - will check uric acid and xray - DG Foot 2 Views  Left; Future - Uric acid - predniSONE (DELTASONE) 20 MG tablet; Take 2 tablets x 2 days, then 1 x 3 days  Dispense: 7 tablet; Refill: 0 - RTC/ER precautions reviewed- fever, increased pain/swelling/redness, calf pain, streaking.   2. BMI 40.0-44.9, adult ([place]) - Hemoglobin A1c   Clarene Reamer, FNP-BC  [facility] [place]  06/12/2017 1:33 PM

## 2017-06-15 ENCOUNTER — Telehealth: Payer: Self-pay | Admitting: Internal Medicine

## 2017-06-15 NOTE — Telephone Encounter (Signed)
pt returning call from [facility]

## 2017-06-16 NOTE — Telephone Encounter (Signed)
Called and spoke with patient regarding results nothing further needed at this time.

## 2017-07-29 ENCOUNTER — Telehealth: Payer: Self-pay | Admitting: Internal Medicine

## 2017-07-29 NOTE — Telephone Encounter (Signed)
Pt request refill  amphetamine-dextroamphetamine (ADDERALL XR) 20 MG 24 hr capsule  amphetamine-dextroamphetamine (ADDERALL) 10 MG  3 mo supply

## 2017-07-30 MED ORDER — AMPHETAMINE-DEXTROAMPHET ER 20 MG PO CP24: 20.0000 mg | ORAL_CAPSULE | Freq: Every day | ORAL | 0 refills | Status: DC

## 2017-07-30 MED ORDER — AMPHETAMINE-DEXTROAMPHETAMINE 10 MG PO TABS: 10.0000 mg | ORAL_TABLET | Freq: Two times a day (BID) | ORAL | 0 refills | Status: DC

## 2017-07-30 NOTE — Telephone Encounter (Signed)
Rx printed, awaiting to be signed  

## 2017-07-31 NOTE — Telephone Encounter (Signed)
Pt notified Rx ready for pickup. Rx printed and signed.  

## 2017-08-11 DIAGNOSIS — L219 Seborrheic dermatitis, unspecified: Secondary | ICD-10-CM | POA: Diagnosis not present

## 2017-08-11 DIAGNOSIS — L821 Other seborrheic keratosis: Secondary | ICD-10-CM | POA: Diagnosis not present

## 2017-08-11 DIAGNOSIS — L814 Other melanin hyperpigmentation: Secondary | ICD-10-CM | POA: Diagnosis not present

## 2017-08-11 DIAGNOSIS — Q825 Congenital non-neoplastic nevus: Secondary | ICD-10-CM | POA: Diagnosis not present

## 2017-08-19 ENCOUNTER — Other Ambulatory Visit: Payer: Self-pay | Admitting: Internal Medicine

## 2017-09-17 ENCOUNTER — Encounter: Payer: Self-pay | Admitting: Internal Medicine

## 2017-10-19 DIAGNOSIS — H35352 Cystoid macular degeneration, left eye: Secondary | ICD-10-CM | POA: Diagnosis not present

## 2017-10-19 DIAGNOSIS — H33002 Unspecified retinal detachment with retinal break, left eye: Secondary | ICD-10-CM | POA: Diagnosis not present

## 2017-10-19 DIAGNOSIS — H33052 Total retinal detachment, left eye: Secondary | ICD-10-CM | POA: Diagnosis not present

## 2017-10-19 DIAGNOSIS — H35371 Puckering of macula, right eye: Secondary | ICD-10-CM | POA: Diagnosis not present

## 2017-10-19 DIAGNOSIS — H3342 Traction detachment of retina, left eye: Secondary | ICD-10-CM | POA: Diagnosis not present

## 2017-11-04 ENCOUNTER — Ambulatory Visit: Payer: BLUE CROSS/BLUE SHIELD | Admitting: Internal Medicine

## 2017-11-04 ENCOUNTER — Encounter: Payer: Self-pay | Admitting: Internal Medicine

## 2017-11-04 VITALS — BP 150/88 | HR 104 | Temp 98.6°F | Ht 66.0 in | Wt 256.4 lb

## 2017-11-04 DIAGNOSIS — J069 Acute upper respiratory infection, unspecified: Secondary | ICD-10-CM

## 2017-11-04 DIAGNOSIS — B9789 Other viral agents as the cause of diseases classified elsewhere: Secondary | ICD-10-CM | POA: Diagnosis not present

## 2017-11-04 MED ORDER — FLUTICASONE PROPIONATE 50 MCG/ACT NA SUSP: 2.0000 | Freq: Every day | NASAL | 6 refills | Status: DC

## 2017-11-04 NOTE — Patient Instructions (Addendum)
Hydrate and Humidify  Drink enough water to keep your urine clear or pale yellow. Staying hydrated will help to thin your mucus.  Use a cool mist humidifier to keep the humidity level in your home above 50%.  Inhale steam for 10-15 minutes, 3-4 times a day or as told by your health care provider. You can do this in the bathroom while a hot shower is running.  Limit your exposure to cool or dry air. Rest  Use  a once daily nonsedating antihistamine such as Allegra or Zyrtec Fluticasone nasal spray daily   Take over-the-counter expectorants and cough medications such as  Mucinex DM.  Call if there is no improvement in 5 to 7 days or if  you develop worsening cough, fever, or new symptoms, such as shortness of breath or chest pain.

## 2017-11-04 NOTE — Progress Notes (Signed)
Subjective:    Patient ID: Alan Tyler, male    DOB: [birth year], 61 y.o.   MRN: [id number]  HPI  61 year old patient who presents with a chief complaint of cough of several weeks duration.  He describes coughing and milling productive as well as some postnasal drip.  He does note some occasional mild wheezing.  Cough and congestion are worse in the evening.  There is been no fever. He has a history of ADHD controlled on Adderall.  Past Medical History:  Diagnosis Date  . Arthritis   . Cancer [facility])    kidney - 2003  . Chronic kidney disease    kidney stones and (R) nephrectomy  . Colon polyps   . Depression   . GERD (gastroesophageal reflux disease)   . Hyperlipidemia   . PVC's (premature ventricular contractions)    arrhythmia     Social History   Socioeconomic History  . Marital status: Married    Spouse name: Not on file  . Number of children: 3  . Years of education: Not on file  . Highest education level: Not on file  Social Needs  . Financial resource strain: Not on file  . Food insecurity - worry: Not on file  . Food insecurity - inability: Not on file  . Transportation needs - medical: Not on file  . Transportation needs - non-medical: Not on file  Occupational History  . 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 Smoking status: Former Smoker    Last attempt to quit: 12/29/2001    Years since quitting: 15.8  . Smokeless tobacco: Former Systems developer    Quit date: 12/27/2002  Substance and Sexual Activity  . Alcohol use: Yes    Comment: rarely  . Drug use: No  . Sexual activity: Not on file  Other Topics Concern  . Not on file  Social History Narrative  . Not on file    Past Surgical History:  Procedure Laterality Date  . CATARACT EXTRACTION     right eye  . NEPHRECTOMY  2003   (R) for cancer tumor  . RETINAL DETACHMENT SURGERY  2012    Family History  Problem Relation Age of Onset  . Arthritis Mother   .  Depression Mother   . Colon cancer Mother        age 35 part of colon was removed  . Alcohol abuse Father   . Heart disease Father   . Hyperlipidemia Father   . Hyperlipidemia Maternal Grandmother   . Hyperlipidemia Maternal Grandfather   . Hyperlipidemia Paternal Grandmother   . Diabetes Paternal Grandmother   . Arthritis Paternal Grandfather   . Hyperlipidemia Paternal Grandfather   . Stomach cancer Neg Hx   . Esophageal cancer Neg Hx   . Rectal cancer Neg Hx     Allergies  Allergen Reactions  . Sulfa Drugs Cross Reactors     Per pt: unknown    Current Outpatient Medications on File Prior to Visit  Medication Sig Dispense Refill  . ALPRAZolam (XANAX) 0.5 MG tablet Take 1 tablet (0.5 mg total) by mouth daily as needed. 30 tablet 2  . amphetamine-dextroamphetamine (ADDERALL XR) 20 MG 24 hr capsule Take 1 capsule (20 mg total) by mouth daily. 30 capsule 0  . amphetamine-dextroamphetamine (ADDERALL XR) 20 MG 24 hr capsule Take 1 capsule (20 mg total) by mouth daily. 30 capsule 0  . amphetamine-dextroamphetamine (ADDERALL XR) 20 MG 24 hr capsule Take 1 capsule (  20 mg total) by mouth daily. 30 capsule 0  . amphetamine-dextroamphetamine (ADDERALL) 10 MG tablet Take 1 tablet (10 mg total) by mouth 2 (two) times daily. 60 tablet 0  . amphetamine-dextroamphetamine (ADDERALL) 10 MG tablet Take 1 tablet (10 mg total) by mouth 2 (two) times daily. 60 tablet 0  . amphetamine-dextroamphetamine (ADDERALL) 10 MG tablet Take 1 tablet (10 mg total) by mouth 2 (two) times daily. 60 tablet 0  . aspirin 81 MG tablet Take 81 mg by mouth daily.      Marland Kitchen buPROPion (WELLBUTRIN XL) 300 MG 24 hr tablet TAKE 1 TABLET EVERY MORNING 90 tablet 1  . ibuprofen (ADVIL,MOTRIN) 200 MG tablet Take 200 mg by mouth every 6 (six) hours as needed.      . Omega-3 Fatty Acids (OMEGA 3 PO) Take by mouth. Take 3,300mg  (2 capsules) daily    . OVER THE COUNTER MEDICATION NATURAL PSYLIUM HUSK FIBER 0.52 grams,(5Capsules) daily      No current facility-administered medications on file prior to visit.     BP (!) 150/88 (BP Location: Left Arm, Patient Position: Sitting, Cuff Size: Large)   Pulse (!) 104   Temp 98.6 F (37 C) (Oral)   Ht 5\' 6"  (1.676 m)   Wt 256 lb 6.4 oz (116.3 kg)   SpO2 97%   BMI 41.38 kg/m     Review of Systems  Constitutional: Positive for activity change. Negative for appetite change, chills, fatigue and fever.  HENT: Positive for congestion, postnasal drip and rhinorrhea. Negative for dental problem, ear pain, hearing loss, sore throat, tinnitus, trouble swallowing and voice change.   Eyes: Negative for pain, discharge and visual disturbance.  Respiratory: Positive for cough and wheezing. Negative for chest tightness and stridor.   Cardiovascular: Negative for chest pain, palpitations and leg swelling.  Gastrointestinal: Negative for abdominal distention, abdominal pain, blood in stool, constipation, diarrhea, nausea and vomiting.  Genitourinary: Negative for difficulty urinating, discharge, flank pain, genital sores, hematuria and urgency.  Musculoskeletal: Negative for arthralgias, back pain, gait problem, joint swelling, myalgias and neck stiffness.  Skin: Negative for rash.  Neurological: Negative for dizziness, syncope, speech difficulty, weakness, numbness and headaches.  Hematological: Negative for adenopathy. Does not bruise/bleed easily.  Psychiatric/Behavioral: Negative for behavioral problems and dysphoric mood. The patient is not nervous/anxious.        Objective:   Physical Exam  Constitutional: He is oriented to person, place, and time. He appears well-developed. No distress.  HENT:  Head: Normocephalic.  Right Ear: External ear normal.  Left Ear: External ear normal.  Eyes: Conjunctivae and EOM are normal.  Neck: Normal range of motion.  Cardiovascular: Normal rate and normal heart sounds.  Pulmonary/Chest: Breath sounds normal. No respiratory distress. He has no  wheezes. He has no rales.  Abdominal: Bowel sounds are normal.  Musculoskeletal: Normal range of motion. He exhibits no edema or tenderness.  Neurological: He is alert and oriented to person, place, and time.  Psychiatric: He has a normal mood and affect. His behavior is normal.          Assessment & Plan:   Viral URI with cough. Chronic cough aggravated by upper airway cough syndrome.  Will treat with antihistamines as well as fluticasone nasal spray Hydration discussed Also treat with Mucinex DM  ADHD stable  CPX as scheduled  Remote history of tobacco use.  The patient is a 30-year smoker but has been abstinent for about 15 years.  Low-dose chest CT scan and discussed  and he will consider  Nyoka Cowden

## 2017-11-09 ENCOUNTER — Other Ambulatory Visit: Payer: Self-pay

## 2017-11-09 NOTE — Telephone Encounter (Signed)
Patient's wife stopped by the office to pick up the refill.  She was advised that she would get a call once the prescription is ready.

## 2017-11-10 ENCOUNTER — Telehealth: Payer: Self-pay | Admitting: Internal Medicine

## 2017-11-10 DIAGNOSIS — Z122 Encounter for screening for malignant neoplasm of respiratory organs: Secondary | ICD-10-CM

## 2017-11-10 MED ORDER — AMPHETAMINE-DEXTROAMPHETAMINE 10 MG PO TABS: 10.0000 mg | ORAL_TABLET | Freq: Two times a day (BID) | ORAL | 0 refills | Status: DC

## 2017-11-10 MED ORDER — AMPHETAMINE-DEXTROAMPHET ER 20 MG PO CP24: 20.0000 mg | ORAL_CAPSULE | Freq: Every day | ORAL | 0 refills | Status: DC

## 2017-11-10 NOTE — Telephone Encounter (Signed)
Please refer to pulmonary medicine for low-dose chest CT lung cancer screening

## 2017-11-10 NOTE — Telephone Encounter (Signed)
Pt would like to have the "scan" that was discuss during office visit.   Please advise.

## 2017-11-10 NOTE — Addendum Note (Signed)
Addended by: Abelardo Diesel on: 11/10/2017 01:29 PM   Modules accepted: Orders

## 2017-12-07 NOTE — Addendum Note (Signed)
Addended by: Dorrene German on: 12/07/2017 10:01 AM   Modules accepted: Orders

## 2017-12-07 NOTE — Telephone Encounter (Signed)
Per pulmonology, a new order it needed for Amb Ref to Lung Cancer Screening instead of pulm referral. New order placed as requested.

## 2017-12-11 ENCOUNTER — Telehealth: Payer: Self-pay | Admitting: Acute Care

## 2017-12-14 NOTE — Telephone Encounter (Signed)
Denise please advise.  

## 2017-12-15 NOTE — Telephone Encounter (Signed)
Spoke with pt .  He reports that they do not have any further questions. Nothing further needed.

## 2018-02-02 ENCOUNTER — Encounter: Payer: Self-pay | Admitting: Gastroenterology

## 2018-02-15 ENCOUNTER — Other Ambulatory Visit: Payer: Self-pay | Admitting: Internal Medicine

## 2018-02-26 ENCOUNTER — Other Ambulatory Visit: Payer: Self-pay | Admitting: Internal Medicine

## 2018-02-26 NOTE — Telephone Encounter (Signed)
Copied from [facility]. Topic: Quick Communication - Rx Refill/Question >> Feb 26, 2018  1:01 PM Alan Tyler wrote: Medication: Adderall XR  20mg      Adderall 10mg    Has the patient contacted their pharmacy? Yes.   Patient hung up to call pharmacy   (Agent: If no, request that the patient contact the pharmacy for the refill.)   Preferred Pharmacy (with phone number or street name):   [facility] - [place], [place] [address] [phone] (Phone) [phone] (Fax)     Agent: Please be advised that RX refills may take up to 3 business days. We ask that you follow-up with your pharmacy.

## 2018-02-26 NOTE — Telephone Encounter (Signed)
LOV: 11/04/17   PCP: Dr. Burnice Logan  Pharmacy: [facility] on [address]

## 2018-03-02 NOTE — Telephone Encounter (Signed)
Relation to pt: self  Call back number: [phone] Pharmacy: [facility] [place], [place] [phone] (Phone) [phone] (Fax)     Reason for call:  Patient checking on the status of medication request, informed patient please allow 72 hour turn around time, please advise

## 2018-03-03 MED ORDER — AMPHETAMINE-DEXTROAMPHET ER 20 MG PO CP24: 20.0000 mg | ORAL_CAPSULE | Freq: Every day | ORAL | 0 refills | Status: DC

## 2018-03-03 MED ORDER — AMPHETAMINE-DEXTROAMPHETAMINE 10 MG PO TABS: 10.0000 mg | ORAL_TABLET | Freq: Two times a day (BID) | ORAL | 0 refills | Status: DC

## 2018-03-03 NOTE — Telephone Encounter (Signed)
Medication printed to be picked up. 

## 2018-03-04 ENCOUNTER — Other Ambulatory Visit: Payer: Self-pay

## 2018-03-04 MED ORDER — AMPHETAMINE-DEXTROAMPHETAMINE 10 MG PO TABS: 10.0000 mg | ORAL_TABLET | Freq: Two times a day (BID) | ORAL | 0 refills | Status: DC

## 2018-03-04 MED ORDER — AMPHETAMINE-DEXTROAMPHET ER 20 MG PO CP24: 20.0000 mg | ORAL_CAPSULE | Freq: Every day | ORAL | 0 refills | Status: DC

## 2018-03-04 NOTE — Telephone Encounter (Signed)
Previous rxs printed w/ wrong provider or wrong fill instructions. All previous rx printed from yesterday and today have been shredded. New rx printed and forwarded to Dr. Sarajane Jews for signature in PCP's absence.

## 2018-03-04 NOTE — Addendum Note (Signed)
Addended by: Dorrene German on: 03/04/2018 11:17 AM   Modules accepted: Orders

## 2018-03-25 ENCOUNTER — Encounter: Payer: Self-pay | Admitting: Gastroenterology

## 2018-04-09 ENCOUNTER — Telehealth: Payer: Self-pay | Admitting: Internal Medicine

## 2018-04-09 NOTE — Telephone Encounter (Signed)
Copied from [facility] [id number]. Topic: Quick Communication - Rx Refill/Question >> Apr 09, 2018 11:13 AM Scherrie Gerlach wrote: Medication: amphetamine-dextroamphetamine (ADDERALL XR) 20 MG 24 hr capsule                     amphetamine-dextroamphetamine (ADDERALL) 10 MG tablet [id number] [place], [place] - [address] [place] [phone] (Phone) [phone] (Fax)

## 2018-04-12 NOTE — Telephone Encounter (Signed)
Okay for Adderall refill

## 2018-04-12 NOTE — Telephone Encounter (Signed)
Patient need to schedule an office visit for more refills.

## 2018-04-14 ENCOUNTER — Other Ambulatory Visit: Payer: Self-pay

## 2018-04-14 MED ORDER — AMPHETAMINE-DEXTROAMPHETAMINE 10 MG PO TABS: 10.0000 mg | ORAL_TABLET | Freq: Two times a day (BID) | ORAL | 0 refills | Status: DC

## 2018-04-14 MED ORDER — AMPHETAMINE-DEXTROAMPHET ER 20 MG PO CP24: 20.0000 mg | ORAL_CAPSULE | Freq: Every day | ORAL | 0 refills | Status: DC

## 2018-04-15 NOTE — Telephone Encounter (Signed)
Left message for patient informing him that medication is ready for pick-up.

## 2018-04-27 ENCOUNTER — Encounter: Payer: Self-pay | Admitting: Internal Medicine

## 2018-04-27 ENCOUNTER — Ambulatory Visit: Payer: BLUE CROSS/BLUE SHIELD | Admitting: Internal Medicine

## 2018-04-27 VITALS — BP 130/80 | HR 92 | Temp 98.2°F | Wt 263.0 lb

## 2018-04-27 DIAGNOSIS — F900 Attention-deficit hyperactivity disorder, predominantly inattentive type: Secondary | ICD-10-CM

## 2018-04-27 DIAGNOSIS — F320 Major depressive disorder, single episode, mild: Secondary | ICD-10-CM

## 2018-04-27 MED ORDER — AMPHETAMINE-DEXTROAMPHETAMINE 10 MG PO TABS: 10.0000 mg | ORAL_TABLET | Freq: Two times a day (BID) | ORAL | 0 refills | Status: DC

## 2018-04-27 MED ORDER — AMPHETAMINE-DEXTROAMPHET ER 20 MG PO CP24: 20.0000 mg | ORAL_CAPSULE | Freq: Every day | ORAL | 0 refills | Status: DC

## 2018-04-27 MED ORDER — ALPRAZOLAM 0.5 MG PO TABS: 0.5000 mg | ORAL_TABLET | Freq: Every day | ORAL | 2 refills | Status: DC | PRN

## 2018-04-27 NOTE — Patient Instructions (Addendum)
Limit your sodium (Salt) intake  Return in 4 months for follow-up    Low-Purine Diet Purines are compounds that affect the level of uric acid in your body. A low-purine diet is a diet that is low in purines. Eating a low-purine diet can prevent the level of uric acid in your body from getting too high and causing gout or kidney stones or both. What do I need to know about this diet?  Choose low-purine foods. Examples of low-purine foods are listed in the next section.  Drink plenty of fluids, especially water. Fluids can help remove uric acid from your body. Try to drink 8-16 cups (1.9-3.8 L) a day.  Limit foods high in fat, especially saturated fat, as fat makes it harder for the body to get rid of uric acid. Foods high in saturated fat include pizza, cheese, ice cream, whole milk, fried foods, and gravies. Choose foods that are lower in fat and lean sources of protein. Use olive oil when cooking as it contains healthy fats that are not high in saturated fat.  Limit alcohol. Alcohol interferes with the elimination of uric acid from your body. If you are having a gout attack, avoid all alcohol.  Keep in mind that different people's bodies react differently to different foods. You will probably learn over time which foods do or do not affect you. If you discover that a food tends to cause your gout to flare up, avoid eating that food. You can more freely enjoy foods that do not cause problems. If you have any questions about a food item, talk to your dietitian or health care provider. Which foods are low, moderate, and high in purines? The following is a list of foods that are low, moderate, and high in purines. You can eat any amount of the foods that are low in purines. You may be able to have small amounts of foods that are moderate in purines. Ask your health care provider how much of a food moderate in purines you can have. Avoid foods high in purines. Grains  Foods low in purines:  Enriched white bread, pasta, rice, cake, cornbread, popcorn.  Foods moderate in purines: Whole-grain breads and cereals, wheat germ, bran, oatmeal. Uncooked oatmeal. Dry wheat bran or wheat germ.  Foods high in purines: Pancakes, [place] toast, biscuits, muffins. Vegetables  Foods low in purines: All vegetables, except those that are moderate in purines.  Foods moderate in purines: Asparagus, cauliflower, spinach, mushrooms, green peas. Fruits  All fruits are low in purines. Meats and other Protein Foods  Foods low in purines: Eggs, nuts, peanut butter.  Foods moderate in purines: 80-90% lean beef, lamb, veal, pork, poultry, fish, eggs, peanut butter, nuts. Crab, lobster, oysters, and shrimp. Cooked dried beans, peas, and lentils.  Foods high in purines: Anchovies, sardines, herring, mussels, tuna, codfish, scallops, trout, and haddock. Berniece Salines. Organ meats (such as liver or kidney). Tripe. Game meat. Goose. Sweetbreads. Dairy  All dairy foods are low in purines. Low-fat and fat-free dairy products are best because they are low in saturated fat. Beverages  Drinks low in purines: Water, carbonated beverages, tea, coffee, cocoa.  Drinks moderate in purines: Soft drinks and other drinks sweetened with high-fructose corn syrup. Juices. To find whether a food or drink is sweetened with high-fructose corn syrup, look at the ingredients list.  Drinks high in purines: Alcoholic beverages (such as beer). Condiments  Foods low in purines: Salt, herbs, olives, pickles, relishes, vinegar.  Foods moderate in purines: Butter,  margarine, oils, mayonnaise. Fats and Oils  Foods low in purines: All types, except gravies and sauces made with meat.  Foods high in purines: Gravies and sauces made with meat. Other Foods  Foods low in purines: Sugars, sweets, gelatin. Cake. Soups made without meat.  Foods moderate in purines: Meat-based or fish-based soups, broths, or bouillons. Foods and drinks  sweetened with high-fructose corn syrup.  Foods high in purines: High-fat desserts (such as ice cream, cookies, cakes, pies, doughnuts, and chocolate). Contact your dietitian for more information on foods that are not listed here. This information is not intended to replace advice given to you by your health care provider. Make sure you discuss any questions you have with your health care provider. Document Released: 04/11/2011 Document Revised: 05/22/2016 Document Reviewed: 11/21/2013 Elsevier Interactive Patient Education  2017 [employer].

## 2018-04-27 NOTE — Progress Notes (Signed)
Subjective:    Patient ID: Alan Tyler, male    DOB: 03/03/1956, 62 y.o.   MRN: [id number]  HPI  62 year old patient who is seen today for his six-month follow-up.  He has a history of mild depression which has been well controlled on Wellbutrin.  He has ADHD which has been well managed with Adderall 20 mg extended release in the morning.  He occasionally takes supplemental short acting Adderall 10 mg in the afternoon.  He is quite pleased with his regimen  Past Medical History:  Diagnosis Date  . Arthritis   . Cancer [facility])    kidney - 2003  . Chronic kidney disease    kidney stones and (R) nephrectomy  . Colon polyps   . Depression   . GERD (gastroesophageal reflux disease)   . Hyperlipidemia   . PVC's (premature ventricular contractions)    arrhythmia     Social History   Socioeconomic History  . Marital status: Married    Spouse name: Not on file  . Number of children: 3  . Years of education: Not on file  . Highest education level: Not on file  Occupational History  . 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 Financial resource strain: Not on file  . Food insecurity:    Worry: Not on file    Inability: Not on file  . Transportation needs:    Medical: Not on file    Non-medical: Not on file  Tobacco Use  . Smoking status: Former Smoker    Last attempt to quit: 12/29/2001    Years since quitting: 16.3  . Smokeless tobacco: Former Systems developer    Quit date: 12/27/2002  Substance and Sexual Activity  . Alcohol use: Yes    Comment: rarely  . Drug use: No  . Sexual activity: Not on file  Lifestyle  . Physical activity:    Days per week: Not on file    Minutes per session: Not on file  . Stress: Not on file  Relationships  . Social connections:    Talks on phone: Not on file    Gets together: Not on file    Attends religious service: Not on file    Active member of club or organization: Not on file    Attends meetings of  clubs or organizations: Not on file    Relationship status: Not on file  . Intimate partner violence:    Fear of current or ex partner: Not on file    Emotionally abused: Not on file    Physically abused: Not on file    Forced sexual activity: Not on file  Other Topics Concern  . Not on file  Social History Narrative  . Not on file    Past Surgical History:  Procedure Laterality Date  . CATARACT EXTRACTION     right eye  . NEPHRECTOMY  2003   (R) for cancer tumor  . RETINAL DETACHMENT SURGERY  2012    Family History  Problem Relation Age of Onset  . Arthritis Mother   . Depression Mother   . Colon cancer Mother        age 69 part of colon was removed  . Alcohol abuse Father   . Heart disease Father   . Hyperlipidemia Father   . Hyperlipidemia Maternal Grandmother   . Hyperlipidemia Maternal Grandfather   . Hyperlipidemia Paternal Grandmother   . Diabetes Paternal Grandmother   . Arthritis Paternal Grandfather   .  Hyperlipidemia Paternal Grandfather   . Stomach cancer Neg Hx   . Esophageal cancer Neg Hx   . Rectal cancer Neg Hx     Allergies  Allergen Reactions  . Sulfa Drugs Cross Reactors     Per pt: unknown    Current Outpatient Medications on File Prior to Visit  Medication Sig Dispense Refill  . aspirin 81 MG tablet Take 81 mg by mouth daily.      Marland Kitchen buPROPion (WELLBUTRIN XL) 300 MG 24 hr tablet TAKE 1 TABLET EVERY MORNING 90 tablet 0  . fluticasone (FLONASE) 50 MCG/ACT nasal spray Place 2 sprays daily into both nostrils. 16 g 6  . ibuprofen (ADVIL,MOTRIN) 200 MG tablet Take 200 mg by mouth every 6 (six) hours as needed.      . Omega-3 Fatty Acids (OMEGA 3 PO) Take by mouth. Take 3,300mg  (2 capsules) daily    . OVER THE COUNTER MEDICATION NATURAL PSYLIUM HUSK FIBER 0.52 grams,(5Capsules) daily     No current facility-administered medications on file prior to visit.     BP 130/80 (BP Location: Right Arm, Patient Position: Sitting)   Pulse 92   Temp 98.2  F (36.8 C) (Oral)   Wt 263 lb (119.3 kg)   SpO2 96%   BMI 42.45 kg/m     Review of Systems  Constitutional: Negative for appetite change, chills, fatigue and fever.  HENT: Negative for congestion, dental problem, ear pain, hearing loss, sore throat, tinnitus, trouble swallowing and voice change.   Eyes: Negative for pain, discharge and visual disturbance.  Respiratory: Negative for cough, chest tightness, wheezing and stridor.   Cardiovascular: Negative for chest pain, palpitations and leg swelling.  Gastrointestinal: Negative for abdominal distention, abdominal pain, blood in stool, constipation, diarrhea, nausea and vomiting.  Genitourinary: Negative for difficulty urinating, discharge, flank pain, genital sores, hematuria and urgency.  Musculoskeletal: Negative for arthralgias, back pain, gait problem, joint swelling, myalgias and neck stiffness.  Skin: Negative for rash.  Neurological: Negative for dizziness, syncope, speech difficulty, weakness, numbness and headaches.  Hematological: Negative for adenopathy. Does not bruise/bleed easily.  Psychiatric/Behavioral: Positive for decreased concentration. Negative for behavioral problems and dysphoric mood. The patient is not nervous/anxious.        Objective:   Physical Exam  Constitutional: He is oriented to person, place, and time. He appears well-developed.     Weight 263 blood pressure well controlled  HENT:  Head: Normocephalic.  Right Ear: External ear normal.  Left Ear: External ear normal.  Eyes: Conjunctivae and EOM are normal.  Neck: Normal range of motion.  Cardiovascular: Normal rate and normal heart sounds.  Pulmonary/Chest: Breath sounds normal.  Abdominal: Bowel sounds are normal.  Musculoskeletal: Normal range of motion. He exhibits no edema or tenderness.  Neurological: He is alert and oriented to person, place, and time.  Psychiatric: He has a normal mood and affect. His behavior is normal.           Assessment & Plan:  ADHD stable and well-controlled on present regimen  History of depression.  In remission no change in therapy overweight.  Schedule CPX 4 months Medications updated  Nyoka Cowden

## 2018-04-28 ENCOUNTER — Encounter: Payer: Self-pay | Admitting: Internal Medicine

## 2018-05-10 ENCOUNTER — Other Ambulatory Visit: Payer: Self-pay

## 2018-05-10 ENCOUNTER — Ambulatory Visit (AMBULATORY_SURGERY_CENTER): Payer: Self-pay | Admitting: *Deleted

## 2018-05-10 VITALS — Ht 66.0 in | Wt 261.0 lb

## 2018-05-10 DIAGNOSIS — Z800 Family history of malignant neoplasm of digestive organs: Secondary | ICD-10-CM

## 2018-05-10 DIAGNOSIS — Z860100 Personal history of colon polyps, unspecified: Secondary | ICD-10-CM

## 2018-05-10 MED ORDER — NA SULFATE-K SULFATE-MG SULF 17.5-3.13-1.6 GM/177ML PO SOLN: ORAL | 0 refills | Status: DC

## 2018-05-10 NOTE — Progress Notes (Signed)
Patient denies any allergies to eggs or soy. Patient denies any problems with anesthesia/sedation. Patient denies any oxygen use at home. Patient denies taking any diet/weight loss medications or blood thinners. EMMI education declined by the pt.  

## 2018-05-17 ENCOUNTER — Other Ambulatory Visit: Payer: Self-pay | Admitting: Internal Medicine

## 2018-05-25 ENCOUNTER — Other Ambulatory Visit: Payer: Self-pay

## 2018-05-25 ENCOUNTER — Ambulatory Visit (AMBULATORY_SURGERY_CENTER): Payer: BLUE CROSS/BLUE SHIELD | Admitting: Gastroenterology

## 2018-05-25 ENCOUNTER — Encounter: Payer: Self-pay | Admitting: Gastroenterology

## 2018-05-25 VITALS — BP 135/78 | HR 79 | Temp 98.9°F | Resp 15 | Ht 66.0 in | Wt 261.0 lb

## 2018-05-25 DIAGNOSIS — D123 Benign neoplasm of transverse colon: Secondary | ICD-10-CM

## 2018-05-25 DIAGNOSIS — D124 Benign neoplasm of descending colon: Secondary | ICD-10-CM

## 2018-05-25 DIAGNOSIS — Z86010 Personal history of colonic polyps: Secondary | ICD-10-CM

## 2018-05-25 MED ORDER — SODIUM CHLORIDE 0.9 % IV SOLN: 500.0000 mL | Freq: Once | INTRAVENOUS | Status: DC

## 2018-05-25 NOTE — Progress Notes (Signed)
Pt's states no medical or surgical changes since previsit or office visit. 

## 2018-05-25 NOTE — Op Note (Signed)
[facility] Patient Name: Alan Tyler Procedure Date: 05/25/2018 8:03 AM MRN: [id number] Endoscopist: Ladene Artist , MD Age: 62 Referring MD:  Date of Birth: 21-Oct-1956 Gender: Male Account #: [id number] Procedure:                Colonoscopy Indications:              Surveillance: Personal history of adenomatous                            polyps on last colonoscopy 5 years ago Medicines:                Monitored Anesthesia Care Procedure:                Pre-Anesthesia Assessment:                           - Prior to the procedure, a History and Physical                            was performed, and patient medications and                            allergies were reviewed. The patient's tolerance of                            previous anesthesia was also reviewed. The risks                            and benefits of the procedure and the sedation                            options and risks were discussed with the patient.                            All questions were answered, and informed consent                            was obtained. Prior Anticoagulants: The patient has                            taken no previous anticoagulant or antiplatelet                            agents. ASA Grade Assessment: II - A patient with                            mild systemic disease. After reviewing the risks                            and benefits, the patient was deemed in                            satisfactory condition to undergo the procedure.  After obtaining informed consent, the colonoscope                            was passed under direct vision. Throughout the                            procedure, the patient's blood pressure, pulse, and                            oxygen saturations were monitored continuously. The                            Model PCF-H190DL [id number]) scope was introduced                            through the anus and  advanced to the the cecum,                            identified by appendiceal orifice and ileocecal                            valve. The ileocecal valve, appendiceal orifice,                            and rectum were photographed. The quality of the                            bowel preparation was good. The colonoscopy was                            performed without difficulty. The patient tolerated                            the procedure well. Scope In: 8:08:31 AM Scope Out: 8:30:50 AM Scope Withdrawal Time: 0 hours 12 minutes 51 seconds  Total Procedure Duration: 0 hours 22 minutes 19 seconds  Findings:                 The perianal and digital rectal examinations were                            normal.                           Three sessile polyps were found in the descending                            colon and transverse colon. The polyps were 7 to 8                            mm in size. These polyps were removed with a cold                            snare. Resection and retrieval were complete.  Two sessile polyps were found in the transverse                            colon. The polyps were 4 mm in size. These polyps                            were removed with a cold biopsy forceps. Resection                            and retrieval were complete.                           A few small-mouthed diverticula were found in the                            left colon.                           Internal hemorrhoids were found during                            retroflexion. The hemorrhoids were small and Grade                            I (internal hemorrhoids that do not prolapse).                           The exam was otherwise without abnormality on                            direct and retroflexion views. Complications:            No immediate complications. Estimated blood loss:                            None. Estimated Blood Loss:     Estimated  blood loss: none. Impression:               - Three 7 to 8 mm polyps in the descending colon                            and in the transverse colon, removed with a cold                            snare. Resected and retrieved.                           - Two 4 mm polyps in the transverse colon, removed                            with a cold biopsy forceps. Resected and retrieved.                           - Mild left colon diverticulosis.                           -  Small internal internal. Recommendation:           - Repeat colonoscopy in 3 - 5 years for                            surveillance.                           - Patient has a contact number available for                            emergencies. The signs and symptoms of potential                            delayed complications were discussed with the                            patient. Return to normal activities tomorrow.                            Written discharge instructions were provided to the                            patient.                           - Resume previous diet.                           - Continue present medications.                           - Await pathology results. Ladene Artist, MD 05/25/2018 8:39:30 AM This report has been signed electronically.

## 2018-05-25 NOTE — Progress Notes (Signed)
Called to room to assist during endoscopic procedure.  Patient ID and intended procedure confirmed with present staff. Received instructions for my participation in the procedure from the performing physician.  

## 2018-05-25 NOTE — Patient Instructions (Signed)
YOU HAD AN ENDOSCOPIC PROCEDURE TODAY AT THE [facility]:   Refer to the procedure report that was given to you for any specific questions about what was found during the examination.  If the procedure report does not answer your questions, please call your gastroenterologist to clarify.  If you requested that your care partner not be given the details of your procedure findings, then the procedure report has been included in a sealed envelope for you to review at your convenience later.  YOU SHOULD EXPECT: Some feelings of bloating in the abdomen. Passage of more gas than usual.  Walking can help get rid of the air that was put into your GI tract during the procedure and reduce the bloating. If you had a lower endoscopy (such as a colonoscopy or flexible sigmoidoscopy) you may notice spotting of blood in your stool or on the toilet paper. If you underwent a bowel prep for your procedure, you may not have a normal bowel movement for a few days.  Please Note:  You might notice some irritation and congestion in your nose or some drainage.  This is from the oxygen used during your procedure.  There is no need for concern and it should clear up in a day or so.  SYMPTOMS TO REPORT IMMEDIATELY:   Following lower endoscopy (colonoscopy or flexible sigmoidoscopy):  Excessive amounts of blood in the stool  Significant tenderness or worsening of abdominal pains  Swelling of the abdomen that is new, acute  Fever of 100F or higher  For urgent or emergent issues, a gastroenterologist can be reached at any hour by calling [phone].   DIET:  We do recommend a small meal at first, but then you may proceed to your regular diet.  Drink plenty of fluids but you should avoid alcoholic beverages for 24 hours.  ACTIVITY:  You should plan to take it easy for the rest of today and you should NOT DRIVE or use heavy machinery until tomorrow (because of the sedation medicines used during the test).     FOLLOW UP: Our staff will call the number listed on your records the next business day following your procedure to check on you and address any questions or concerns that you may have regarding the information given to you following your procedure. If we do not reach you, we will leave a message.  However, if you are feeling well and you are not experiencing any problems, there is no need to return our call.  We will assume that you have returned to your regular daily activities without incident.  If any biopsies were taken you will be contacted by phone or by letter within the next 1-3 weeks.  Please call us at [phone] if you have not heard about the biopsies in 3 weeks.    SIGNATURES/CONFIDENTIALITY: You and/or your care partner have signed paperwork which will be entered into your electronic medical record.  These signatures attest to the fact that that the information above on your After Visit Summary has been reviewed and is understood.  Full responsibility of the confidentiality of this discharge information lies with you and/or your care-partner. 

## 2018-05-25 NOTE — Progress Notes (Signed)
Report to PACU, RN, vss, BBS= Clear.  

## 2018-05-26 ENCOUNTER — Telehealth: Payer: Self-pay | Admitting: *Deleted

## 2018-05-26 NOTE — Telephone Encounter (Signed)
  Follow up Call-  Call back number 05/25/2018  Post procedure Call Back phone  # [phone]  Permission to leave phone message Yes  Some recent data might be hidden     Patient questions:  Do you have a fever, pain , or abdominal swelling? No. Pain Score  0 *  Have you tolerated food without any problems? Yes.    Have you been able to return to your normal activities? Yes.    Do you have any questions about your discharge instructions: Diet   No. Medications  No. Follow up visit  No.  Do you have questions or concerns about your Care? No.  Actions: * If pain score is 4 or above: No action needed, pain <4.

## 2018-06-02 ENCOUNTER — Encounter: Payer: Self-pay | Admitting: Gastroenterology

## 2018-07-12 DIAGNOSIS — H35352 Cystoid macular degeneration, left eye: Secondary | ICD-10-CM | POA: Diagnosis not present

## 2018-07-12 DIAGNOSIS — H3342 Traction detachment of retina, left eye: Secondary | ICD-10-CM | POA: Diagnosis not present

## 2018-07-12 DIAGNOSIS — H33002 Unspecified retinal detachment with retinal break, left eye: Secondary | ICD-10-CM | POA: Diagnosis not present

## 2018-07-12 DIAGNOSIS — H35371 Puckering of macula, right eye: Secondary | ICD-10-CM | POA: Diagnosis not present

## 2018-07-12 DIAGNOSIS — H2702 Aphakia, left eye: Secondary | ICD-10-CM | POA: Diagnosis not present

## 2018-08-09 ENCOUNTER — Other Ambulatory Visit: Payer: Self-pay | Admitting: Internal Medicine

## 2018-08-09 DIAGNOSIS — Z0000 Encounter for general adult medical examination without abnormal findings: Secondary | ICD-10-CM

## 2018-08-15 ENCOUNTER — Other Ambulatory Visit: Payer: Self-pay | Admitting: Internal Medicine

## 2018-08-24 ENCOUNTER — Other Ambulatory Visit: Payer: Self-pay | Admitting: Internal Medicine

## 2018-08-24 NOTE — Telephone Encounter (Signed)
Copied from [place] [id number]. Topic: Quick Communication - Rx Refill/Question >> Aug 24, 2018  1:23 PM [name], [place] C wrote: Medication: amphetamine-dextroamphetamine (ADDERALL) 10 MG tablet  and also amphetamine-dextroamphetamine (ADDERALL) 20 MG tablet   Has the patient contacted their pharmacy? Yes  (Agent: If no, request that the patient contact the pharmacy for the refill.)  (Agent: If yes, when and what did the pharmacy advise?)  Preferred Pharmacy (with phone number or street name): [id number] - [place], [address]: Please be advised that RX refills may take up to 3 business days. We ask that you follow-up with your pharmacy.

## 2018-08-24 NOTE — Telephone Encounter (Signed)
Refill of adderall  LOV 04/27/18 Dr. Burnice Logan  [facility] 04/27/18  #[phone] refills  [place], [place]

## 2018-08-24 NOTE — Telephone Encounter (Signed)
Copied from [place] [id number]. Topic: Quick Communication - Rx Refill/Question >> Aug 24, 2018  1:23 PM [name], [place] C wrote: Medication: amphetamine-dextroamphetamine (ADDERALL) 10 MG tablet  and also amphetamine-dextroamphetamine (ADDERALL) 20 MG tablet   Has the patient contacted their pharmacy? Yes  (Agent: If no, request that the patient contact the pharmacy for the refill.)  (Agent: If yes, when and what did the pharmacy advise?)  Preferred Pharmacy (with phone number or street name): [id number] - [place], [place]: Please be advised that RX refills may take up to 3 business days. We ask that you follow-up with your pharmacy.

## 2018-08-25 ENCOUNTER — Other Ambulatory Visit (INDEPENDENT_AMBULATORY_CARE_PROVIDER_SITE_OTHER): Payer: BLUE CROSS/BLUE SHIELD

## 2018-08-25 DIAGNOSIS — Z0000 Encounter for general adult medical examination without abnormal findings: Secondary | ICD-10-CM

## 2018-08-25 LAB — COMPREHENSIVE METABOLIC PANEL
ALT: 24 U/L (ref 0–53)
AST: 19 U/L (ref 0–37)
Albumin: 4 g/dL (ref 3.5–5.2)
Alkaline Phosphatase: 67 U/L (ref 39–117)
BUN: 14 mg/dL (ref 6–23)
CO2: 24 mEq/L (ref 19–32)
Calcium: 8.8 mg/dL (ref 8.4–10.5)
Chloride: 102 mEq/L (ref 96–112)
Creatinine, Ser: 1.39 mg/dL (ref 0.40–1.50)
GFR: 55.11 mL/min — ABNORMAL LOW (ref >60.00)
Glucose, Bld: 104 mg/dL — ABNORMAL HIGH (ref 70–99)
Potassium: 4 mEq/L (ref 3.5–5.1)
Sodium: 136 mEq/L (ref 135–145)
Total Bilirubin: 0.9 mg/dL (ref 0.2–1.2)
Total Protein: 6.6 g/dL (ref 6.0–8.3)

## 2018-08-25 LAB — CBC WITH DIFFERENTIAL/PLATELET
Basophils Absolute: 0 10*3/uL (ref 0.0–0.1)
Basophils Relative: 0.6 % (ref 0.0–3.0)
Eosinophils Absolute: 0.4 10*3/uL (ref 0.0–0.7)
Eosinophils Relative: 4.5 % (ref 0.0–5.0)
HCT: 44.4 % (ref 39.0–52.0)
Hemoglobin: 15.2 g/dL (ref 13.0–17.0)
Lymphocytes Relative: 22.3 % (ref 12.0–46.0)
Lymphs Abs: 1.9 10*3/uL (ref 0.7–4.0)
MCHC: 34.2 g/dL (ref 30.0–36.0)
MCV: 88.1 fl (ref 78.0–100.0)
Monocytes Absolute: 0.6 10*3/uL (ref 0.1–1.0)
Monocytes Relative: 7.3 % (ref 3.0–12.0)
Neutro Abs: 5.5 10*3/uL (ref 1.4–7.7)
Neutrophils Relative %: 65.3 % (ref 43.0–77.0)
Platelets: 210 10*3/uL (ref 150.0–400.0)
RBC: 5.04 Mil/uL (ref 4.22–5.81)
RDW: 13.9 % (ref 11.5–15.5)
WBC: 8.4 10*3/uL (ref 4.0–10.5)

## 2018-08-25 LAB — TSH: TSH: 5.13 u[IU]/mL — ABNORMAL HIGH (ref 0.35–4.50)

## 2018-08-25 LAB — LIPID PANEL
Cholesterol: 192 mg/dL (ref 0–200)
HDL: 39.6 mg/dL (ref >39.00)
LDL Cholesterol: 122 mg/dL — ABNORMAL HIGH (ref 0–99)
NonHDL: 152.03
Total CHOL/HDL Ratio: 5
Triglycerides: 150 mg/dL — ABNORMAL HIGH (ref 0.0–149.0)
VLDL: 30 mg/dL (ref 0.0–40.0)

## 2018-08-25 LAB — PSA: PSA: 1.45 ng/mL (ref 0.10–4.00)

## 2018-08-26 LAB — HEPATITIS C ANTIBODY
Hepatitis C Ab: NONREACTIVE
SIGNAL TO CUT-OFF: 0.01 (ref <1.00)

## 2018-08-31 ENCOUNTER — Encounter: Payer: Self-pay | Admitting: Internal Medicine

## 2018-08-31 ENCOUNTER — Ambulatory Visit: Payer: BLUE CROSS/BLUE SHIELD | Admitting: Internal Medicine

## 2018-08-31 VITALS — BP 130/80 | HR 92 | Temp 98.3°F | Ht 66.75 in | Wt 266.0 lb

## 2018-08-31 DIAGNOSIS — Z0000 Encounter for general adult medical examination without abnormal findings: Secondary | ICD-10-CM

## 2018-08-31 MED ORDER — AMPHETAMINE-DEXTROAMPHET ER 20 MG PO CP24: 20.0000 mg | ORAL_CAPSULE | Freq: Every day | ORAL | 0 refills | Status: DC

## 2018-08-31 MED ORDER — BUPROPION HCL ER (XL) 300 MG PO TB24: 300.0000 mg | ORAL_TABLET | Freq: Every morning | ORAL | 4 refills | Status: DC

## 2018-08-31 MED ORDER — AMPHETAMINE-DEXTROAMPHETAMINE 10 MG PO TABS: 10.0000 mg | ORAL_TABLET | Freq: Two times a day (BID) | ORAL | 0 refills | Status: DC

## 2018-08-31 MED ORDER — ALPRAZOLAM 0.5 MG PO TABS: 0.5000 mg | ORAL_TABLET | Freq: Every day | ORAL | 2 refills | Status: DC | PRN

## 2018-08-31 MED ORDER — FLUTICASONE PROPIONATE 50 MCG/ACT NA SUSP: 2.0000 | Freq: Every day | NASAL | 6 refills | Status: DC

## 2018-08-31 NOTE — Progress Notes (Signed)
Subjective:    Patient ID: Alan Tyler, male    DOB: 1956/10/21, 62 y.o.   MRN: [id number]  HPI  62 year old patient who is seen today for a preventive health examination He has a history of depression which has been very well controlled on Wellbutrin.  He does have some situational stress at work but in general doing quite well He has ADHD which has done remarkably well on low-dose Adderall.  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 He has a history of colonic polyps and last colonoscopy was earlier this spring.  3-year interval suggested.  Past Medical History:  Diagnosis Date  . Arthritis   . Cancer [facility]) 2003   kidney - 2003  . Chronic kidney disease    kidney stones and (R) nephrectomy  . Colon polyps   . Depression   . GERD (gastroesophageal reflux disease)   . Hyperlipidemia   . PVC's (premature ventricular contractions)    arrhythmia     Social History   Socioeconomic History  . Marital status: Married    Spouse name: Not on file  . Number of children: 3  . Years of education: Not on file  . Highest education level: Not on file  Occupational History  . 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 Financial resource strain: Not on file  . Food insecurity:    Worry: Not on file    Inability: Not on file  . Transportation needs:    Medical: Not on file    Non-medical: Not on file  Tobacco Use  . Smoking status: Former Smoker    Last attempt to quit: 12/29/2001    Years since quitting: 16.6  . Smokeless tobacco: Former Systems developer    Quit date: 12/27/2002  Substance and Sexual Activity  . Alcohol use: Yes    Alcohol/week: 4.0 standard drinks    Types: 4 Standard drinks or equivalent per week  . Drug use: No  . Sexual activity: Not on file  Lifestyle  . Physical activity:    Days per week: Not on file    Minutes per session: Not on file  . Stress: Not on file  Relationships  .  Social connections:    Talks on phone: Not on file    Gets together: Not on file    Attends religious service: Not on file    Active member of club or organization: Not on file    Attends meetings of clubs or organizations: Not on file    Relationship status: Not on file  . Intimate partner violence:    Fear of current or ex partner: Not on file    Emotionally abused: Not on file    Physically abused: Not on file    Forced sexual activity: Not on file  Other Topics Concern  . Not on file  Social History Narrative  . Not on file    Past Surgical History:  Procedure Laterality Date  . CATARACT EXTRACTION     right eye  . COLONOSCOPY  2014  . NEPHRECTOMY  2003   (R) for cancer tumor  . RETINAL DETACHMENT SURGERY  2012    Family History  Problem Relation Age of Onset  . Arthritis Mother   . Depression Mother   . Colon cancer Mother 20       part of colon was removed  . Alcohol abuse Father   . Heart  disease Father   . Hyperlipidemia Father   . Hyperlipidemia Maternal Grandmother   . Hyperlipidemia Maternal Grandfather   . Hyperlipidemia Paternal Grandmother   . Diabetes Paternal Grandmother   . Rectal cancer Paternal Grandmother   . Arthritis Paternal Grandfather   . Hyperlipidemia Paternal Grandfather   . Stomach cancer Neg Hx   . Esophageal cancer Neg Hx     Allergies  Allergen Reactions  . Sulfa Drugs Cross Reactors     Per pt: unknown    Current Outpatient Medications on File Prior to Visit  Medication Sig Dispense Refill  . aspirin 81 MG tablet Take 81 mg by mouth daily.      . B Complex-C (SUPER B COMPLEX PO) Take 1 tablet by mouth daily.    . Biotin 10000 MCG TABS Take 1 tablet by mouth daily.    . Cholecalciferol (VITAMIN D PO) Take 1 tablet by mouth daily.    Marland Kitchen GLUCOSAMINE-CHONDROITIN ER PO Take 2 tablets by mouth daily.    . Multiple Vitamin (ONE-A-DAY MENS PO) Take 1 tablet by mouth daily.    . Omega-3 Fatty Acids (OMEGA 3 PO) Take by mouth. Take  3,300mg  (2 capsules) daily    . OVER THE COUNTER MEDICATION NATURAL PSYLIUM HUSK FIBER 0.52 grams,(5Capsules) daily    . TURMERIC PO Take 500 mg by mouth daily.    Marland Kitchen VITAMIN E-400 PO Take 1 capsule by mouth daily.    Marland Kitchen zinc gluconate 50 MG tablet Take 50 mg by mouth daily.     Current Facility-Administered Medications on File Prior to Visit  Medication Dose Route Frequency Provider Last Rate Last Dose  . 0.9 %  sodium chloride infusion  500 mL Intravenous Once Lucio Edward T, MD        BP 130/80 (BP Location: Right Arm, Patient Position: Sitting, Cuff Size: Large)   Pulse 92   Temp 98.3 F (36.8 C) (Oral)   Ht 5' 6.75" (1.695 m)   Wt 266 lb (120.7 kg)   SpO2 95%   BMI 41.97 kg/m     Review of Systems  Constitutional: Negative for appetite change, chills, fatigue and fever.  HENT: Negative for congestion, dental problem, ear pain, hearing loss, sore throat, tinnitus, trouble swallowing and voice change.   Eyes: Negative for pain, discharge and visual disturbance.  Respiratory: Negative for cough, chest tightness, wheezing and stridor.   Cardiovascular: Negative for chest pain, palpitations and leg swelling.  Gastrointestinal: Negative for abdominal distention, abdominal pain, blood in stool, constipation, diarrhea, nausea and vomiting.  Genitourinary: Negative for difficulty urinating, discharge, flank pain, genital sores, hematuria and urgency.  Musculoskeletal: Negative for arthralgias, back pain, gait problem, joint swelling, myalgias and neck stiffness.  Skin: Negative for rash.  Neurological: Negative for dizziness, syncope, speech difficulty, weakness, numbness and headaches.  Hematological: Negative for adenopathy. Does not bruise/bleed easily.  Psychiatric/Behavioral: Positive for decreased concentration. Negative for behavioral problems and dysphoric mood. The patient is nervous/anxious.        Objective:   Physical Exam  Constitutional: He appears well-developed  and well-nourished.  Weight 266  HENT:  Head: Normocephalic and atraumatic.  Right Ear: External ear normal.  Left Ear: External ear normal.  Nose: Nose normal.  Mouth/Throat: Oropharynx is clear and moist.  Eyes: Pupils are equal, round, and reactive to light. Conjunctivae and EOM are normal. No scleral icterus.  Neck: Normal range of motion. Neck supple. No JVD present. No thyromegaly present.  Cardiovascular: Regular rhythm, normal heart  sounds and intact distal pulses. Exam reveals no gallop and no friction rub.  No murmur heard. Pulmonary/Chest: Effort normal and breath sounds normal. He exhibits no tenderness.  Abdominal: Soft. Bowel sounds are normal. He exhibits no distension and no mass. There is no tenderness.  Genitourinary: Penis normal.  Musculoskeletal: Normal range of motion. He exhibits no edema or tenderness.  Lymphadenopathy:    He has no cervical adenopathy.  Neurological: He is alert. He has normal reflexes. No cranial nerve deficit. Coordination normal.  Skin: Skin is warm and dry. No rash noted.  Psychiatric: He has a normal mood and affect. His behavior is normal.          Assessment & Plan:   Preventive health examination Nephrolithiasis History of colonic polyps.  Follow-up colonoscopy 2022 Status post nephrectomy Chronic kidney disease.  GFR 55.  Patient is aware to avoid anti-inflammatory medications Impaired glucose tolerance.  Weight loss encouraged Abnormal TSH.  TSH borderline high.  Patient aware of signs and symptoms of hypothyroidism.  Will recheck next visit ADHD.  Well-controlled on present regimen  Follow-up in 6 months.  Check TSH at that time  Marletta Lor

## 2018-08-31 NOTE — Patient Instructions (Signed)
Limit your sodium (Salt) intake    It is important that you exercise regularly, at least 20 minutes 3 to 4 times per week.  If you develop chest pain or shortness of breath seek  medical attention.  You need to lose weight.  Consider a lower calorie diet and regular exercise.  Return in one year for follow-up 

## 2018-10-21 DIAGNOSIS — L219 Seborrheic dermatitis, unspecified: Secondary | ICD-10-CM | POA: Diagnosis not present

## 2018-10-21 DIAGNOSIS — Q825 Congenital non-neoplastic nevus: Secondary | ICD-10-CM | POA: Diagnosis not present

## 2018-10-21 DIAGNOSIS — L814 Other melanin hyperpigmentation: Secondary | ICD-10-CM | POA: Diagnosis not present

## 2018-10-21 DIAGNOSIS — B078 Other viral warts: Secondary | ICD-10-CM | POA: Diagnosis not present

## 2018-10-21 DIAGNOSIS — L821 Other seborrheic keratosis: Secondary | ICD-10-CM | POA: Diagnosis not present

## 2018-10-21 DIAGNOSIS — Z23 Encounter for immunization: Secondary | ICD-10-CM | POA: Diagnosis not present

## 2018-12-03 ENCOUNTER — Encounter: Payer: Self-pay | Admitting: Internal Medicine

## 2018-12-03 ENCOUNTER — Ambulatory Visit (INDEPENDENT_AMBULATORY_CARE_PROVIDER_SITE_OTHER): Payer: BLUE CROSS/BLUE SHIELD | Admitting: Internal Medicine

## 2018-12-03 VITALS — BP 128/80 | HR 92 | Temp 98.6°F | Ht 66.75 in | Wt 264.6 lb

## 2018-12-03 DIAGNOSIS — F320 Major depressive disorder, single episode, mild: Secondary | ICD-10-CM

## 2018-12-03 DIAGNOSIS — R7989 Other specified abnormal findings of blood chemistry: Secondary | ICD-10-CM

## 2018-12-03 DIAGNOSIS — F900 Attention-deficit hyperactivity disorder, predominantly inattentive type: Secondary | ICD-10-CM | POA: Diagnosis not present

## 2018-12-03 LAB — TSH: TSH: 3.46 u[IU]/mL (ref 0.35–4.50)

## 2018-12-03 LAB — T4, FREE: Free T4: 0.63 ng/dL (ref 0.60–1.60)

## 2018-12-03 MED ORDER — AMPHETAMINE-DEXTROAMPHET ER 20 MG PO CP24: 20.0000 mg | ORAL_CAPSULE | Freq: Every day | ORAL | 0 refills | Status: DC

## 2018-12-03 MED ORDER — AMPHETAMINE-DEXTROAMPHETAMINE 10 MG PO TABS: 10.0000 mg | ORAL_TABLET | Freq: Every day | ORAL | 0 refills | Status: DC | PRN

## 2018-12-03 MED ORDER — AMPHETAMINE-DEXTROAMPHETAMINE 10 MG PO TABS: 10.0000 mg | ORAL_TABLET | Freq: Two times a day (BID) | ORAL | 0 refills | Status: DC

## 2018-12-03 NOTE — Patient Instructions (Addendum)
-  It was nice meeting you today!  -We will check your thyroid levels before you leave today, we will notify you of the results.  -PHQ 9 today (depression screening)  -Schedule your annual physical after September 2020.  Please come fasting to that visit

## 2018-12-03 NOTE — Progress Notes (Signed)
Established Patient Office Visit     CC/Reason for Visit: Establish care, follow-up chronic medical conditions, medication refills  HPI: Alan Tyler is a 62 y.o. male who is coming in today for the above mentioned reasons. Due for annual physical exam in September 2020. Past Medical History is significant for: Depression, ADHD.  His depression has been stable on Wellbutrin for years.  He states the Adderall for his ADHD has been a "life saver".  He has been able to keep his job, function at work and in his family life.  He takes extended release 20 mg daily and then a 10 mg tablet as needed, he ends up taking this about every other day/every day.  He has no acute complaints at today's visit.  It was noted during his annual physical in September that his TSH was 5.13.  His prior PCPs plan was to have it rechecked in 3 months.   Past Medical/Surgical History: Past Medical History:  Diagnosis Date  . Arthritis   . Cancer [facility]) 2003   kidney - 2003  . Chronic kidney disease    kidney stones and (R) nephrectomy  . Colon polyps   . Depression   . GERD (gastroesophageal reflux disease)   . Hyperlipidemia   . PVC's (premature ventricular contractions)    arrhythmia    Past Surgical History:  Procedure Laterality Date  . CATARACT EXTRACTION     right eye  . COLONOSCOPY  2014  . NEPHRECTOMY  2003   (R) for cancer tumor  . RETINAL DETACHMENT SURGERY  2012    Social History:  reports that he quit smoking about 16 years ago. He quit smokeless tobacco use about 15 years ago. He reports that he drinks about 4.0 standard drinks of alcohol per week. He reports that he does not use drugs.  Allergies: Allergies  Allergen Reactions  . Sulfa Drugs Cross Reactors     Per pt: unknown    Family History:  Family History  Problem Relation Age of Onset  . Arthritis Mother   . Depression Mother   . Colon cancer Mother 74       part of colon was removed  . Alcohol abuse Father   .  Heart disease Father   . Hyperlipidemia Father   . Hyperlipidemia Maternal Grandmother   . Hyperlipidemia Maternal Grandfather   . Hyperlipidemia Paternal Grandmother   . Diabetes Paternal Grandmother   . Rectal cancer Paternal Grandmother   . Arthritis Paternal Grandfather   . Hyperlipidemia Paternal Grandfather   . Stomach cancer Neg Hx   . Esophageal cancer Neg Hx      Current Outpatient Medications:  .  amphetamine-dextroamphetamine (ADDERALL XR) 20 MG 24 hr capsule, Take 1 capsule (20 mg total) by mouth daily., Disp: 30 capsule, Rfl: 0 .  B Complex-C (SUPER B COMPLEX PO), Take 1 tablet by mouth daily., Disp: , Rfl:  .  Biotin 10000 MCG TABS, Take 1 tablet by mouth daily., Disp: , Rfl:  .  buPROPion (WELLBUTRIN XL) 300 MG 24 hr tablet, Take 1 tablet (300 mg total) by mouth every morning., Disp: 90 tablet, Rfl: 4 .  Cholecalciferol (VITAMIN D PO), Take 1 tablet by mouth daily., Disp: , Rfl:  .  GLUCOSAMINE-CHONDROITIN ER PO, Take 2 tablets by mouth daily., Disp: , Rfl:  .  Multiple Vitamin (ONE-A-DAY MENS PO), Take 1 tablet by mouth daily., Disp: , Rfl:  .  Omega-3 Fatty Acids (OMEGA 3 PO), Take by  mouth. Take 3,300mg  (2 capsules) daily, Disp: , Rfl:  .  OVER THE COUNTER MEDICATION, NATURAL PSYLIUM HUSK FIBER 0.52 grams,(5Capsules) daily, Disp: , Rfl:  .  TURMERIC PO, Take 500 mg by mouth daily., Disp: , Rfl:  .  VITAMIN E-400 PO, Take 1 capsule by mouth daily., Disp: , Rfl:  .  amphetamine-dextroamphetamine (ADDERALL) 10 MG tablet, Take 1 tablet (10 mg total) by mouth daily as needed., Disp: 60 tablet, Rfl: 0  Current Facility-Administered Medications:  .  0.9 %  sodium chloride infusion, 500 mL, Intravenous, Once, Ladene Artist, MD  Review of Systems:  Constitutional: Denies fever, chills, diaphoresis, appetite change and fatigue.  HEENT: Denies photophobia, eye pain, redness, hearing loss, ear pain, congestion, sore throat, rhinorrhea, sneezing, mouth sores, trouble  swallowing, neck pain, neck stiffness and tinnitus.   Respiratory: Denies SOB, DOE, cough, chest tightness,  and wheezing.   Cardiovascular: Denies chest pain, palpitations and leg swelling.  Gastrointestinal: Denies nausea, vomiting, abdominal pain, diarrhea, constipation, blood in stool and abdominal distention.  Genitourinary: Denies dysuria, urgency, frequency, hematuria, flank pain and difficulty urinating.  Endocrine: Denies: hot or cold intolerance, sweats, changes in hair or nails, polyuria, polydipsia. Musculoskeletal: Denies myalgias, back pain, joint swelling, arthralgias and gait problem.  Skin: Denies pallor, rash and wound.  Neurological: Denies dizziness, seizures, syncope, weakness, light-headedness, numbness and headaches.  Hematological: Denies adenopathy. Easy bruising, personal or family bleeding history  Psychiatric/Behavioral: Denies suicidal ideation, mood changes, confusion, nervousness, sleep disturbance and agitation    Physical Exam: Vitals:   12/03/18 1341  BP: 128/80  Pulse: 92  Temp: 98.6 F (37 C)  TempSrc: Oral  Weight: 264 lb 9.6 oz (120 kg)  Height: 5' 6.75" (1.695 m)    Body mass index is 41.75 kg/m.   Constitutional: NAD, calm, comfortable Eyes: PERRL, lids and conjunctivae normal ENMT: Mucous membranes are moist. Posterior pharynx clear of any exudate or lesions. Normal dentition. Tympanic membrane is pearly white, no erythema or bulging. Neck: normal, supple, no masses, no thyromegaly Respiratory: clear to auscultation bilaterally, no wheezing, no crackles. Normal respiratory effort. No accessory muscle use.  Cardiovascular: Regular rate and rhythm, no murmurs / rubs / gallops. No extremity edema. 2+ pedal pulses. No carotid bruits.  Abdomen: no tenderness, no masses palpated. No hepatosplenomegaly. Bowel sounds positive.  Musculoskeletal: no clubbing / cyanosis. No joint deformity upper and lower extremities. Good ROM, no contractures.  Normal muscle tone.  Skin: no rashes, lesions, ulcers. No induration Neurologic: CN 2-12 grossly intact. Sensation intact, DTR normal. Strength 5/5 in all 4.  Psychiatric: Normal judgment and insight. Alert and oriented x 3. Normal mood.    Impression and Plan:  Elevated TSH  -TSH was 5.13 in September.  He has not had any symptoms of hypothyroidism. -We will go ahead and repeat TSH and free T4 today, if TSH remains elevated may consider starting treatment.  Attention deficit hyperactivity disorder (ADHD), predominantly inattentive type -Adderall refills given today.  Current mild episode of major depressive disorder, unspecified whether recurrent (HCC) -Mood appears stable. -PHQ 9 today with a score of 1.  Morbid obesity ([place]) -Will need to continue to discuss healthy lifestyle choices including increased physical activity and better food choices.    Patient Instructions  -It was nice meeting you today!  -We will check your thyroid levels before you leave today, we will notify you of the results.  -PHQ 9 today (depression screening)  -Schedule your annual physical after September 2020.  Please  come fasting to that visit     Lelon Frohlich, MD [facility]

## 2018-12-27 ENCOUNTER — Encounter: Payer: Self-pay | Admitting: Internal Medicine

## 2018-12-27 MED ORDER — BUPROPION HCL ER (XL) 300 MG PO TB24: 300.0000 mg | ORAL_TABLET | Freq: Every morning | ORAL | 1 refills | Status: DC

## 2018-12-27 MED ORDER — BUPROPION HCL ER (XL) 300 MG PO TB24: 300.0000 mg | ORAL_TABLET | Freq: Every morning | ORAL | 0 refills | Status: DC

## 2019-02-02 ENCOUNTER — Other Ambulatory Visit: Payer: Self-pay | Admitting: Internal Medicine

## 2019-02-02 DIAGNOSIS — F900 Attention-deficit hyperactivity disorder, predominantly inattentive type: Secondary | ICD-10-CM

## 2019-02-02 MED ORDER — AMPHETAMINE-DEXTROAMPHETAMINE 10 MG PO TABS: 10.0000 mg | ORAL_TABLET | Freq: Every day | ORAL | 0 refills | Status: DC | PRN

## 2019-02-02 MED ORDER — AMPHETAMINE-DEXTROAMPHET ER 20 MG PO CP24: 20.0000 mg | ORAL_CAPSULE | Freq: Every day | ORAL | 0 refills | Status: DC

## 2019-03-02 ENCOUNTER — Other Ambulatory Visit: Payer: Self-pay | Admitting: Internal Medicine

## 2019-03-02 DIAGNOSIS — F900 Attention-deficit hyperactivity disorder, predominantly inattentive type: Secondary | ICD-10-CM

## 2019-03-02 MED ORDER — AMPHETAMINE-DEXTROAMPHET ER 20 MG PO CP24: 20.0000 mg | ORAL_CAPSULE | Freq: Every day | ORAL | 0 refills | Status: DC

## 2019-03-02 MED ORDER — AMPHETAMINE-DEXTROAMPHETAMINE 10 MG PO TABS: 10.0000 mg | ORAL_TABLET | Freq: Every day | ORAL | 0 refills | Status: DC | PRN

## 2019-03-08 ENCOUNTER — Encounter: Payer: Self-pay | Admitting: Internal Medicine

## 2019-04-04 ENCOUNTER — Encounter: Payer: Self-pay | Admitting: Internal Medicine

## 2019-04-04 ENCOUNTER — Other Ambulatory Visit: Payer: Self-pay | Admitting: Internal Medicine

## 2019-04-04 DIAGNOSIS — F900 Attention-deficit hyperactivity disorder, predominantly inattentive type: Secondary | ICD-10-CM

## 2019-04-13 ENCOUNTER — Ambulatory Visit (INDEPENDENT_AMBULATORY_CARE_PROVIDER_SITE_OTHER): Payer: BLUE CROSS/BLUE SHIELD | Admitting: Internal Medicine

## 2019-04-13 ENCOUNTER — Other Ambulatory Visit: Payer: Self-pay

## 2019-04-13 DIAGNOSIS — F900 Attention-deficit hyperactivity disorder, predominantly inattentive type: Secondary | ICD-10-CM | POA: Diagnosis not present

## 2019-04-13 MED ORDER — AMPHETAMINE-DEXTROAMPHETAMINE 10 MG PO TABS: 10.0000 mg | ORAL_TABLET | Freq: Every day | ORAL | 0 refills | Status: DC | PRN

## 2019-04-13 MED ORDER — AMPHETAMINE-DEXTROAMPHET ER 20 MG PO CP24: 20.0000 mg | ORAL_CAPSULE | ORAL | 0 refills | Status: DC

## 2019-04-13 MED ORDER — AMPHETAMINE-DEXTROAMPHET ER 20 MG PO CP24: 20.0000 mg | ORAL_CAPSULE | Freq: Every day | ORAL | 0 refills | Status: DC

## 2019-04-13 NOTE — Progress Notes (Signed)
Virtual Visit via Video Note  I connected with Alan Tyler on 04/13/19 at 10:30 AM EDT by a video enabled telemedicine application and verified that I am speaking with the correct person using two identifiers.  Location patient: home Location provider: work office Persons participating in the virtual visit: patient, provider  I discussed the limitations of evaluation and management by telemedicine and the availability of in person appointments. The patient expressed understanding and agreed to proceed.   HPI: The purpose of this visit id for adderall refills.  He has been doing great since I last saw him. Is due for 3 month adderall refills. Has no acute complaints today. He has been on adderall for years. He works second shift so in addition to the 20 mg XR he takes in the am, he takes a 10 mg tablet in the evenings around 5 pm.   ROS: Constitutional: Denies fever, chills, diaphoresis, appetite change and fatigue.  HEENT: Denies photophobia, eye pain, redness, hearing loss, ear pain, congestion, sore throat, rhinorrhea, sneezing, mouth sores, trouble swallowing, neck pain, neck stiffness and tinnitus.   Respiratory: Denies SOB, DOE, cough, chest tightness,  and wheezing.   Cardiovascular: Denies chest pain, palpitations and leg swelling.  Gastrointestinal: Denies nausea, vomiting, abdominal pain, diarrhea, constipation, blood in stool and abdominal distention.  Genitourinary: Denies dysuria, urgency, frequency, hematuria, flank pain and difficulty urinating.  Endocrine: Denies: hot or cold intolerance, sweats, changes in hair or nails, polyuria, polydipsia. Musculoskeletal: Denies myalgias, back pain, joint swelling, arthralgias and gait problem.  Skin: Denies pallor, rash and wound.  Neurological: Denies dizziness, seizures, syncope, weakness, light-headedness, numbness and headaches.  Hematological: Denies adenopathy. Easy bruising, personal or family bleeding history   Psychiatric/Behavioral: Denies suicidal ideation, mood changes, confusion, nervousness, sleep disturbance and agitation   Past Medical History:  Diagnosis Date  . Arthritis   . Cancer [facility]) 2003   kidney - 2003  . Chronic kidney disease    kidney stones and (R) nephrectomy  . Colon polyps   . Depression   . GERD (gastroesophageal reflux disease)   . Hyperlipidemia   . PVC's (premature ventricular contractions)    arrhythmia    Past Surgical History:  Procedure Laterality Date  . CATARACT EXTRACTION     right eye  . COLONOSCOPY  2014  . NEPHRECTOMY  2003   (R) for cancer tumor  . RETINAL DETACHMENT SURGERY  2012    Family History  Problem Relation Age of Onset  . Arthritis Mother   . Depression Mother   . Colon cancer Mother 11       part of colon was removed  . Alcohol abuse Father   . Heart disease Father   . Hyperlipidemia Father   . Hyperlipidemia Maternal Grandmother   . Hyperlipidemia Maternal Grandfather   . Hyperlipidemia Paternal Grandmother   . Diabetes Paternal Grandmother   . Rectal cancer Paternal Grandmother   . Arthritis Paternal Grandfather   . Hyperlipidemia Paternal Grandfather   . Stomach cancer Neg Hx   . Esophageal cancer Neg Hx     SOCIAL HX:   reports that he quit smoking about 17 years ago. He quit smokeless tobacco use about 16 years ago. He reports current alcohol use of about 4.0 standard drinks of alcohol per week. He reports that he does not use drugs.   Current Outpatient Medications:  .  amphetamine-dextroamphetamine (ADDERALL XR) 20 MG 24 hr capsule, Take 1 capsule (20 mg total) by mouth daily., Disp:  30 capsule, Rfl: 0 .  amphetamine-dextroamphetamine (ADDERALL XR) 20 MG 24 hr capsule, Take 1 capsule (20 mg total) by mouth every morning., Disp: 30 capsule, Rfl: 0 .  amphetamine-dextroamphetamine (ADDERALL XR) 20 MG 24 hr capsule, Take 1 capsule (20 mg total) by mouth every morning., Disp: 30 capsule, Rfl: 0 .   amphetamine-dextroamphetamine (ADDERALL) 10 MG tablet, Take 1 tablet (10 mg total) by mouth daily as needed., Disp: 30 tablet, Rfl: 0 .  amphetamine-dextroamphetamine (ADDERALL) 10 MG tablet, Take 1 tablet (10 mg total) by mouth daily as needed., Disp: 30 tablet, Rfl: 0 .  amphetamine-dextroamphetamine (ADDERALL) 10 MG tablet, Take 1 tablet (10 mg total) by mouth daily as needed., Disp: 30 tablet, Rfl: 0 .  B Complex-C (SUPER B COMPLEX PO), Take 1 tablet by mouth daily., Disp: , Rfl:  .  Biotin 10000 MCG TABS, Take 1 tablet by mouth daily., Disp: , Rfl:  .  buPROPion (WELLBUTRIN XL) 300 MG 24 hr tablet, Take 1 tablet (300 mg total) by mouth every morning., Disp: 90 tablet, Rfl: 1 .  Cholecalciferol (VITAMIN D PO), Take 1 tablet by mouth daily., Disp: , Rfl:  .  GLUCOSAMINE-CHONDROITIN ER PO, Take 2 tablets by mouth daily., Disp: , Rfl:  .  Multiple Vitamin (ONE-A-DAY MENS PO), Take 1 tablet by mouth daily., Disp: , Rfl:  .  Omega-3 Fatty Acids (OMEGA 3 PO), Take by mouth. Take 3,300mg  (2 capsules) daily, Disp: , Rfl:  .  OVER THE COUNTER MEDICATION, NATURAL PSYLIUM HUSK FIBER 0.52 grams,(5Capsules) daily, Disp: , Rfl:  .  TURMERIC PO, Take 500 mg by mouth daily., Disp: , Rfl:  .  VITAMIN E-400 PO, Take 1 capsule by mouth daily., Disp: , Rfl:   Current Facility-Administered Medications:  .  0.9 %  sodium chloride infusion, 500 mL, Intravenous, Once, Ladene Artist, MD  EXAM:   VITALS per patient if applicable: none reported  GENERAL: alert, oriented, appears well and in no acute distress  HEENT: atraumatic, conjunttiva clear, no obvious abnormalities on inspection of external nose and ears  NECK: normal movements of the head and neck  LUNGS: on inspection no signs of respiratory distress, breathing rate appears normal, no obvious gross increased work of breathing, gasping or wheezing  CV: no obvious cyanosis  MS: moves all visible extremities without noticeable abnormality   PSYCH/NEURO: pleasant and cooperative, no obvious depression or anxiety, speech and thought processing grossly intact  ASSESSMENT AND PLAN:   Attention deficit hyperactivity disorder (ADHD), predominantly inattentive type -No new issues. -Adderall refills provided today.    I discussed the assessment and treatment plan with the patient. The patient was provided an opportunity to ask questions and all were answered. The patient agreed with the plan and demonstrated an understanding of the instructions.   The patient was advised to call back or seek an in-person evaluation if the symptoms worsen or if the condition fails to improve as anticipated.    Lelon Frohlich, MD  [facility]

## 2019-06-08 ENCOUNTER — Telehealth: Payer: Self-pay | Admitting: Internal Medicine

## 2019-06-08 NOTE — Telephone Encounter (Signed)
Copied from [place] [id number]. Topic: Quick Communication - Rx Refill/Question >> Jun 08, 2019  1:19 PM Burchel, Abbi R wrote: Medication: amphetamine-dextroamphetamine (ADDERALL XR) 20 MG 24 hr capsule, amphetamine-dextroamphetamine (ADDERALL) 10 MG tablet   Pt's wife states that this rx is not due for refill until 6/16, but they will be out of town and they would like to request it early.  Please advise.   Preferred Pharmacy (with phone number or street name): [id number] - [place], [address] [place]  [phone] (Phone) [phone] (Fax)    Pt was advised that RX refills may take up to 3 business days. We ask that you follow-up with your pharmacy.

## 2019-06-09 NOTE — Telephone Encounter (Signed)
Spoke with pharmacist and refills will be filled in a couple of days.

## 2019-06-10 NOTE — Telephone Encounter (Signed)
Pts wife called stating the pharmacy states they have not received  Any response. Pts wife states she is leaving out of town early in the morning. Please advise.

## 2019-06-25 ENCOUNTER — Other Ambulatory Visit: Payer: Self-pay | Admitting: Internal Medicine

## 2019-06-29 ENCOUNTER — Encounter: Payer: Self-pay | Admitting: Internal Medicine

## 2019-06-30 MED ORDER — BUPROPION HCL ER (XL) 300 MG PO TB24: 300.0000 mg | ORAL_TABLET | Freq: Every morning | ORAL | 1 refills | Status: DC

## 2019-07-11 DIAGNOSIS — H3342 Traction detachment of retina, left eye: Secondary | ICD-10-CM | POA: Diagnosis not present

## 2019-07-11 DIAGNOSIS — H2702 Aphakia, left eye: Secondary | ICD-10-CM | POA: Diagnosis not present

## 2019-07-11 DIAGNOSIS — H35352 Cystoid macular degeneration, left eye: Secondary | ICD-10-CM | POA: Diagnosis not present

## 2019-07-11 DIAGNOSIS — H35371 Puckering of macula, right eye: Secondary | ICD-10-CM | POA: Diagnosis not present

## 2019-07-12 ENCOUNTER — Encounter: Payer: Self-pay | Admitting: Internal Medicine

## 2019-07-13 ENCOUNTER — Ambulatory Visit (INDEPENDENT_AMBULATORY_CARE_PROVIDER_SITE_OTHER): Payer: Self-pay | Admitting: Internal Medicine

## 2019-07-13 ENCOUNTER — Other Ambulatory Visit: Payer: Self-pay

## 2019-07-13 DIAGNOSIS — F900 Attention-deficit hyperactivity disorder, predominantly inattentive type: Secondary | ICD-10-CM

## 2019-07-13 MED ORDER — AMPHETAMINE-DEXTROAMPHET ER 20 MG PO CP24: 20.0000 mg | ORAL_CAPSULE | ORAL | 0 refills | Status: DC

## 2019-07-13 MED ORDER — AMPHETAMINE-DEXTROAMPHET ER 20 MG PO CP24: 20.0000 mg | ORAL_CAPSULE | Freq: Every day | ORAL | 0 refills | Status: DC

## 2019-07-13 MED ORDER — AMPHETAMINE-DEXTROAMPHETAMINE 10 MG PO TABS: 10.0000 mg | ORAL_TABLET | Freq: Every day | ORAL | 0 refills | Status: DC | PRN

## 2019-07-13 NOTE — Progress Notes (Signed)
Virtual Visit via Video Note  I connected with Alan Tyler on 07/13/19 at  9:00 AM EDT by a video enabled telemedicine application and verified that I am speaking with the correct person using two identifiers.  Location patient: home Location provider: work office Persons participating in the virtual visit: patient, provider  I discussed the limitations of evaluation and management by telemedicine and the availability of in person appointments. The patient expressed understanding and agreed to proceed.   HPI: This is a scheduled visit for Adderall refills.  He has a history of ADHD and does well on this medication.  He takes 20 mg of extended release in the morning, because he works second shift he will usually take a 10 mg tablet in the evening if necessary.  This ends up being about pretty much every day.  He has not noticed increased anxiety, hypertension and is otherwise doing well.  PDMP has been reviewed and there are no red flags.   ROS: Constitutional: Denies fever, chills, diaphoresis, appetite change and fatigue.  HEENT: Denies photophobia, eye pain, redness, hearing loss, ear pain, congestion, sore throat, rhinorrhea, sneezing, mouth sores, trouble swallowing, neck pain, neck stiffness and tinnitus.   Respiratory: Denies SOB, DOE, cough, chest tightness,  and wheezing.   Cardiovascular: Denies chest pain, palpitations and leg swelling.  Gastrointestinal: Denies nausea, vomiting, abdominal pain, diarrhea, constipation, blood in stool and abdominal distention.  Genitourinary: Denies dysuria, urgency, frequency, hematuria, flank pain and difficulty urinating.  Endocrine: Denies: hot or cold intolerance, sweats, changes in hair or nails, polyuria, polydipsia. Musculoskeletal: Denies myalgias, back pain, joint swelling, arthralgias and gait problem.  Skin: Denies pallor, rash and wound.  Neurological: Denies dizziness, seizures, syncope, weakness, light-headedness, numbness and  headaches.  Hematological: Denies adenopathy. Easy bruising, personal or family bleeding history  Psychiatric/Behavioral: Denies suicidal ideation, mood changes, confusion, nervousness, sleep disturbance and agitation   Past Medical History:  Diagnosis Date  . Arthritis   . Cancer [facility]) 2003   kidney - 2003  . Chronic kidney disease    kidney stones and (R) nephrectomy  . Colon polyps   . Depression   . GERD (gastroesophageal reflux disease)   . Hyperlipidemia   . PVC's (premature ventricular contractions)    arrhythmia    Past Surgical History:  Procedure Laterality Date  . CATARACT EXTRACTION     right eye  . COLONOSCOPY  2014  . NEPHRECTOMY  2003   (R) for cancer tumor  . RETINAL DETACHMENT SURGERY  2012    Family History  Problem Relation Age of Onset  . Arthritis Mother   . Depression Mother   . Colon cancer Mother 25       part of colon was removed  . Alcohol abuse Father   . Heart disease Father   . Hyperlipidemia Father   . Hyperlipidemia Maternal Grandmother   . Hyperlipidemia Maternal Grandfather   . Hyperlipidemia Paternal Grandmother   . Diabetes Paternal Grandmother   . Rectal cancer Paternal Grandmother   . Arthritis Paternal Grandfather   . Hyperlipidemia Paternal Grandfather   . Stomach cancer Neg Hx   . Esophageal cancer Neg Hx     SOCIAL HX:   reports that he quit smoking about 17 years ago. He quit smokeless tobacco use about 16 years ago. He reports current alcohol use of about 4.0 standard drinks of alcohol per week. He reports that he does not use drugs.   Current Outpatient Medications:  .  amphetamine-dextroamphetamine (  ADDERALL XR) 20 MG 24 hr capsule, Take 1 capsule (20 mg total) by mouth daily., Disp: 30 capsule, Rfl: 0 .  amphetamine-dextroamphetamine (ADDERALL XR) 20 MG 24 hr capsule, Take 1 capsule (20 mg total) by mouth every morning., Disp: 30 capsule, Rfl: 0 .  amphetamine-dextroamphetamine (ADDERALL XR) 20 MG 24 hr capsule,  Take 1 capsule (20 mg total) by mouth every morning., Disp: 30 capsule, Rfl: 0 .  amphetamine-dextroamphetamine (ADDERALL) 10 MG tablet, Take 1 tablet (10 mg total) by mouth daily as needed., Disp: 30 tablet, Rfl: 0 .  amphetamine-dextroamphetamine (ADDERALL) 10 MG tablet, Take 1 tablet (10 mg total) by mouth daily as needed., Disp: 30 tablet, Rfl: 0 .  amphetamine-dextroamphetamine (ADDERALL) 10 MG tablet, Take 1 tablet (10 mg total) by mouth daily as needed., Disp: 30 tablet, Rfl: 0 .  B Complex-C (SUPER B COMPLEX PO), Take 1 tablet by mouth daily., Disp: , Rfl:  .  Biotin 10000 MCG TABS, Take 1 tablet by mouth daily., Disp: , Rfl:  .  buPROPion (WELLBUTRIN XL) 300 MG 24 hr tablet, Take 1 tablet (300 mg total) by mouth every morning., Disp: 90 tablet, Rfl: 1 .  Cholecalciferol (VITAMIN D PO), Take 1 tablet by mouth daily., Disp: , Rfl:  .  GLUCOSAMINE-CHONDROITIN ER PO, Take 2 tablets by mouth daily., Disp: , Rfl:  .  Multiple Vitamin (ONE-A-DAY MENS PO), Take 1 tablet by mouth daily., Disp: , Rfl:  .  Omega-3 Fatty Acids (OMEGA 3 PO), Take by mouth. Take 3,300mg  (2 capsules) daily, Disp: , Rfl:  .  OVER THE COUNTER MEDICATION, NATURAL PSYLIUM HUSK FIBER 0.52 grams,(5Capsules) daily, Disp: , Rfl:  .  TURMERIC PO, Take 500 mg by mouth daily., Disp: , Rfl:  .  VITAMIN E-400 PO, Take 1 capsule by mouth daily., Disp: , Rfl:   Current Facility-Administered Medications:  .  0.9 %  sodium chloride infusion, 500 mL, Intravenous, Once, Ladene Artist, MD  EXAM:   VITALS per patient if applicable: None reported  GENERAL: alert, oriented, appears well and in no acute distress  HEENT: atraumatic, conjunttiva clear, no obvious abnormalities on inspection of external nose and ears, wears corrective lenses  NECK: normal movements of the head and neck  LUNGS: on inspection no signs of respiratory distress, breathing rate appears normal, no obvious gross increased work of breathing, gasping or  wheezing  CV: no obvious cyanosis  MS: moves all visible extremities without noticeable abnormality  PSYCH/NEURO: pleasant and cooperative, no obvious depression or anxiety, speech and thought processing grossly intact  ASSESSMENT AND PLAN:   Attention deficit hyperactivity disorder (ADHD), predominantly inattentive type  -Has been doing well overall. -PDMP has been reviewed without red flags. -Will refill Adderall for 3 months.  Next visit in 3 months will need CPE.    I discussed the assessment and treatment plan with the patient. The patient was provided an opportunity to ask questions and all were answered. The patient agreed with the plan and demonstrated an understanding of the instructions.   The patient was advised to call back or seek an in-person evaluation if the symptoms worsen or if the condition fails to improve as anticipated.    Lelon Frohlich, MD  [facility] at [facility]

## 2019-08-04 ENCOUNTER — Telehealth: Payer: BC Managed Care – PPO | Admitting: Physician Assistant

## 2019-08-04 DIAGNOSIS — R0600 Dyspnea, unspecified: Secondary | ICD-10-CM

## 2019-08-04 DIAGNOSIS — B9689 Other specified bacterial agents as the cause of diseases classified elsewhere: Secondary | ICD-10-CM

## 2019-08-04 DIAGNOSIS — J208 Acute bronchitis due to other specified organisms: Secondary | ICD-10-CM

## 2019-08-04 DIAGNOSIS — R079 Chest pain, unspecified: Secondary | ICD-10-CM

## 2019-08-04 NOTE — Progress Notes (Signed)
Based on what you shared with me, I feel your condition warrants further evaluation and I recommend that you be seen for a face to face office visit.  Giving severity of symptoms, lightheadedness/dizziness you need to be seen for exam, assessment of oxygen status, chest x-ray etc.   NOTE: If you entered your credit card information for this eVisit, you will not be charged. You may see a "hold" on your card for the $35 but that hold will drop off and you will not have a charge processed.  If you are having a true medical emergency please call 911.     For an urgent face to face visit, [facility] has five urgent care centers for your convenience:    [email or web address] to reserve your spot online an avoid wait times  [facility] (New Address!) [address], [place], [place] [address] *Just off [employer], across the road from [place] hours of operation: Monday-Friday, 12 PM to 6 PM  Closed Saturday & Sunday   The following sites will take your insurance:  . [facility]    [phone]                  Get Driving Directions  [address], [place] [address] . 10 am to 8 pm Monday-Friday . 12 pm to 8 pm Saturday-Sunday   . [facility] at [place]                  Get Driving Directions  [address], [place], [place] [address] . 8 am to 8 pm Monday-Friday . 9 am to 6 pm Saturday . 11 am to 6 pm Sunday   . [facility] at [place]                  Get Driving Directions   [address], [place] [address] . 8 am to 8 pm Monday-Friday . 8 am to 4 pm Saturday-Sunday    . [facility] at [place]                    Get Driving Directions  [phone]  [address], [place], [place] [address]  . Monday-Friday, 12 PM to 6 PM    Your e-visit answers  were reviewed by a board certified advanced clinical practitioner to complete your personal care plan.  Thank you for using e-Visits.

## 2019-08-05 ENCOUNTER — Telehealth (INDEPENDENT_AMBULATORY_CARE_PROVIDER_SITE_OTHER): Payer: BC Managed Care – PPO | Admitting: Internal Medicine

## 2019-08-05 ENCOUNTER — Other Ambulatory Visit: Payer: Self-pay

## 2019-08-05 DIAGNOSIS — Z20822 Contact with and (suspected) exposure to covid-19: Secondary | ICD-10-CM

## 2019-08-05 DIAGNOSIS — Z20828 Contact with and (suspected) exposure to other viral communicable diseases: Secondary | ICD-10-CM

## 2019-08-05 DIAGNOSIS — R05 Cough: Secondary | ICD-10-CM | POA: Diagnosis not present

## 2019-08-05 DIAGNOSIS — R059 Cough, unspecified: Secondary | ICD-10-CM

## 2019-08-05 MED ORDER — BENZONATATE 100 MG PO CAPS: 100.0000 mg | ORAL_CAPSULE | Freq: Two times a day (BID) | ORAL | 0 refills | Status: DC | PRN

## 2019-08-05 NOTE — Progress Notes (Signed)
Virtual Visit via Video Note  I connected with Alan Tyler on 08/05/19 at 11:00 AM EDT by a video enabled telemedicine application and verified that I am speaking with the correct person using two identifiers.  Location patient: home Location provider: work office Persons participating in the virtual visit: patient, provider  I discussed the limitations of evaluation and management by telemedicine and the availability of in person appointments. The patient expressed understanding and agreed to proceed.   HPI: He has scheduled this visit to discuss some acute concerns.  He has been having a nonproductive cough now for months.  He has seasonal allergies and notes that it usually gets worse during springtime.  In the past couple weeks though the cough has become tenacious, he has coughing spasms to the point where he feels like he is going to pass out.  He has decreased energy, has been having difficulty sleeping.  He does not have any fever but does endorse subjective chills, he has been dizzy, has had a significant headache, also notes loss of smell and taste.  No shortness of breath.  His daughter got married 2 weeks ago and they had a small wedding at her house with about 12-15 people.  Nobody was wearing masks and there was no social distancing.   ROS: Constitutional: Denies diaphoresis, appetite change and fatigue.  HEENT: Denies photophobia, eye pain, redness, hearing loss, ear pain, congestion, sore throat, rhinorrhea, sneezing, mouth sores, trouble swallowing, neck pain, neck stiffness and tinnitus.   Respiratory: Denies SOB, DOE, cough, chest tightness,  and wheezing.   Cardiovascular: Denies chest pain, palpitations and leg swelling.  Gastrointestinal: Denies nausea, vomiting, abdominal pain, diarrhea, constipation, blood in stool and abdominal distention.  Genitourinary: Denies dysuria, urgency, frequency, hematuria, flank pain and difficulty urinating.  Endocrine: Denies: hot  or cold intolerance, sweats, changes in hair or nails, polyuria, polydipsia. Musculoskeletal: Denies myalgias, back pain, joint swelling, arthralgias and gait problem.  Skin: Denies pallor, rash and wound.  Neurological: Denies  seizures, syncope and numbness. Hematological: Denies adenopathy. Easy bruising, personal or family bleeding history  Psychiatric/Behavioral: Denies suicidal ideation, mood changes, confusion, nervousness, sleep disturbance and agitation   Past Medical History:  Diagnosis Date  . Arthritis   . Cancer [facility]) 2003   kidney - 2003  . Chronic kidney disease    kidney stones and (R) nephrectomy  . Colon polyps   . Depression   . GERD (gastroesophageal reflux disease)   . Hyperlipidemia   . PVC's (premature ventricular contractions)    arrhythmia    Past Surgical History:  Procedure Laterality Date  . CATARACT EXTRACTION     right eye  . COLONOSCOPY  2014  . NEPHRECTOMY  2003   (R) for cancer tumor  . RETINAL DETACHMENT SURGERY  2012    Family History  Problem Relation Age of Onset  . Arthritis Mother   . Depression Mother   . Colon cancer Mother 57       part of colon was removed  . Alcohol abuse Father   . Heart disease Father   . Hyperlipidemia Father   . Hyperlipidemia Maternal Grandmother   . Hyperlipidemia Maternal Grandfather   . Hyperlipidemia Paternal Grandmother   . Diabetes Paternal Grandmother   . Rectal cancer Paternal Grandmother   . Arthritis Paternal Grandfather   . Hyperlipidemia Paternal Grandfather   . Stomach cancer Neg Hx   . Esophageal cancer Neg Hx     SOCIAL HX:   reports  that he quit smoking about 17 years ago. He quit smokeless tobacco use about 16 years ago. He reports current alcohol use of about 4.0 standard drinks of alcohol per week. He reports that he does not use drugs.   Current Outpatient Medications:  .  amphetamine-dextroamphetamine (ADDERALL XR) 20 MG 24 hr capsule, Take 1 capsule (20 mg total) by mouth  daily., Disp: 30 capsule, Rfl: 0 .  amphetamine-dextroamphetamine (ADDERALL XR) 20 MG 24 hr capsule, Take 1 capsule (20 mg total) by mouth every morning., Disp: 30 capsule, Rfl: 0 .  amphetamine-dextroamphetamine (ADDERALL XR) 20 MG 24 hr capsule, Take 1 capsule (20 mg total) by mouth every morning., Disp: 30 capsule, Rfl: 0 .  amphetamine-dextroamphetamine (ADDERALL) 10 MG tablet, Take 1 tablet (10 mg total) by mouth daily as needed., Disp: 30 tablet, Rfl: 0 .  amphetamine-dextroamphetamine (ADDERALL) 10 MG tablet, Take 1 tablet (10 mg total) by mouth daily as needed., Disp: 30 tablet, Rfl: 0 .  amphetamine-dextroamphetamine (ADDERALL) 10 MG tablet, Take 1 tablet (10 mg total) by mouth daily as needed., Disp: 30 tablet, Rfl: 0 .  B Complex-C (SUPER B COMPLEX PO), Take 1 tablet by mouth daily., Disp: , Rfl:  .  benzonatate (TESSALON) 100 MG capsule, Take 1 capsule (100 mg total) by mouth 2 (two) times daily as needed for cough., Disp: 25 capsule, Rfl: 0 .  Biotin 10000 MCG TABS, Take 1 tablet by mouth daily., Disp: , Rfl:  .  buPROPion (WELLBUTRIN XL) 300 MG 24 hr tablet, Take 1 tablet (300 mg total) by mouth every morning., Disp: 90 tablet, Rfl: 1 .  Cholecalciferol (VITAMIN D PO), Take 1 tablet by mouth daily., Disp: , Rfl:  .  GLUCOSAMINE-CHONDROITIN ER PO, Take 2 tablets by mouth daily., Disp: , Rfl:  .  Multiple Vitamin (ONE-A-DAY MENS PO), Take 1 tablet by mouth daily., Disp: , Rfl:  .  Omega-3 Fatty Acids (OMEGA 3 PO), Take by mouth. Take 3,300mg  (2 capsules) daily, Disp: , Rfl:  .  OVER THE COUNTER MEDICATION, NATURAL PSYLIUM HUSK FIBER 0.52 grams,(5Capsules) daily, Disp: , Rfl:  .  TURMERIC PO, Take 500 mg by mouth daily., Disp: , Rfl:  .  VITAMIN E-400 PO, Take 1 capsule by mouth daily., Disp: , Rfl:   Current Facility-Administered Medications:  .  0.9 %  sodium chloride infusion, 500 mL, Intravenous, Once, Ladene Artist, MD  EXAM:   VITALS per patient if applicable: None reported   GENERAL: alert, oriented, appears well and in no acute distress  HEENT: atraumatic, conjunttiva clear, no obvious abnormalities on inspection of external nose and ears, face appears flushed, wears corrective lenses  NECK: normal movements of the head and neck  LUNGS: on inspection no signs of respiratory distress, breathing rate appears normal, no obvious gross increased work of breathing, gasping or wheezing  CV: no obvious cyanosis  MS: moves all visible extremities without noticeable abnormality  PSYCH/NEURO: pleasant and cooperative, no obvious depression or anxiety, speech and thought processing grossly intact  ASSESSMENT AND PLAN:   Cough  -With his conglomeration of symptoms, medium sized gathering of people that are not household contacts during his daughter's wedding recently, I am concerned for potential exposure to COVID-19. -He will be sent for testing today. -Have advised continued use of Flonase, Mucinex and will also send some Tessalon Perles.  He may use Tylenol as needed for pain relief. -He has been advised to go to the emergency department over the weekend if he develops  any high fevers, shortness of breath or progressive symptoms.     I discussed the assessment and treatment plan with the patient. The patient was provided an opportunity to ask questions and all were answered. The patient agreed with the plan and demonstrated an understanding of the instructions.   The patient was advised to call back or seek an in-person evaluation if the symptoms worsen or if the condition fails to improve as anticipated.    Lelon Frohlich, MD  [facility] at [facility]

## 2019-08-06 LAB — NOVEL CORONAVIRUS, NAA: SARS-CoV-2, NAA: NOT DETECTED

## 2019-10-16 ENCOUNTER — Other Ambulatory Visit: Payer: Self-pay | Admitting: Internal Medicine

## 2019-10-16 DIAGNOSIS — F900 Attention-deficit hyperactivity disorder, predominantly inattentive type: Secondary | ICD-10-CM

## 2019-10-18 ENCOUNTER — Telehealth (INDEPENDENT_AMBULATORY_CARE_PROVIDER_SITE_OTHER): Payer: BC Managed Care – PPO | Admitting: Internal Medicine

## 2019-10-18 ENCOUNTER — Other Ambulatory Visit: Payer: Self-pay

## 2019-10-18 DIAGNOSIS — F900 Attention-deficit hyperactivity disorder, predominantly inattentive type: Secondary | ICD-10-CM

## 2019-10-18 MED ORDER — AMPHETAMINE-DEXTROAMPHET ER 20 MG PO CP24: 20.0000 mg | ORAL_CAPSULE | ORAL | 0 refills | Status: DC

## 2019-10-18 MED ORDER — AMPHETAMINE-DEXTROAMPHETAMINE 10 MG PO TABS: 10.0000 mg | ORAL_TABLET | Freq: Every day | ORAL | 0 refills | Status: DC | PRN

## 2019-10-18 MED ORDER — AMPHETAMINE-DEXTROAMPHET ER 20 MG PO CP24: 20.0000 mg | ORAL_CAPSULE | Freq: Every day | ORAL | 0 refills | Status: DC

## 2019-10-18 NOTE — Progress Notes (Signed)
Virtual Visit via Telephone Note  I connected with Alan Tyler on 10/18/19 at  3:30 PM EDT by telephone and verified that I am speaking with the correct person using two identifiers.   I discussed the limitations, risks, security and privacy concerns of performing an evaluation and management service by telephone and the availability of in person appointments. I also discussed with the patient that there may be a patient responsible charge related to this service. The patient expressed understanding and agreed to proceed.  We initially attempted to connect via video chat but were unable to due to technical difficulties on the patient's end, so we converted this visit to a phone visit.   Location patient: home Location provider: work office Participants present for the call: patient, provider Patient did not have a visit in the prior 7 days to address this/these issue(s).   History of Present Illness:  This is a scheduled visit for Adderall refills.  He has a history of ADHD and does well on this medication.  He takes 20 mg of extended release in the morning, because he works second shift he will usually take a 10 mg tablet in the evening if necessary.  This ends up being about pretty much every day.  He has not noticed increased anxiety, hypertension and is otherwise doing well.  PDMP has been reviewed and there are no red flags.   Observations/Objective: Patient sounds cheerful and well on the phone. I do not appreciate any increased work of breathing. Speech and thought processing are grossly intact. Patient reported vitals: none reported   Current Outpatient Medications:  .  amphetamine-dextroamphetamine (ADDERALL XR) 20 MG 24 hr capsule, Take 1 capsule (20 mg total) by mouth every morning., Disp: 30 capsule, Rfl: 0 .  amphetamine-dextroamphetamine (ADDERALL XR) 20 MG 24 hr capsule, Take 1 capsule (20 mg total) by mouth every morning., Disp: 30 capsule, Rfl: 0 .   amphetamine-dextroamphetamine (ADDERALL XR) 20 MG 24 hr capsule, Take 1 capsule (20 mg total) by mouth daily., Disp: 30 capsule, Rfl: 0 .  amphetamine-dextroamphetamine (ADDERALL) 10 MG tablet, Take 1 tablet (10 mg total) by mouth daily as needed., Disp: 30 tablet, Rfl: 0 .  amphetamine-dextroamphetamine (ADDERALL) 10 MG tablet, Take 1 tablet (10 mg total) by mouth daily as needed., Disp: 30 tablet, Rfl: 0 .  amphetamine-dextroamphetamine (ADDERALL) 10 MG tablet, Take 1 tablet (10 mg total) by mouth daily as needed., Disp: 30 tablet, Rfl: 0 .  B Complex-C (SUPER B COMPLEX PO), Take 1 tablet by mouth daily., Disp: , Rfl:  .  benzonatate (TESSALON) 100 MG capsule, Take 1 capsule (100 mg total) by mouth 2 (two) times daily as needed for cough., Disp: 25 capsule, Rfl: 0 .  Biotin 10000 MCG TABS, Take 1 tablet by mouth daily., Disp: , Rfl:  .  buPROPion (WELLBUTRIN XL) 300 MG 24 hr tablet, Take 1 tablet (300 mg total) by mouth every morning., Disp: 90 tablet, Rfl: 1 .  Cholecalciferol (VITAMIN D PO), Take 1 tablet by mouth daily., Disp: , Rfl:  .  GLUCOSAMINE-CHONDROITIN ER PO, Take 2 tablets by mouth daily., Disp: , Rfl:  .  Multiple Vitamin (ONE-A-DAY MENS PO), Take 1 tablet by mouth daily., Disp: , Rfl:  .  Omega-3 Fatty Acids (OMEGA 3 PO), Take by mouth. Take 3,300mg  (2 capsules) daily, Disp: , Rfl:  .  OVER THE COUNTER MEDICATION, NATURAL PSYLIUM HUSK FIBER 0.52 grams,(5Capsules) daily, Disp: , Rfl:  .  TURMERIC PO, Take 500  mg by mouth daily., Disp: , Rfl:  .  VITAMIN E-400 PO, Take 1 capsule by mouth daily., Disp: , Rfl:   Current Facility-Administered Medications:  .  0.9 %  sodium chloride infusion, 500 mL, Intravenous, Once, Ladene Artist, MD  Review of Systems:  Constitutional: Denies fever, chills, diaphoresis, appetite change and fatigue.  HEENT: Denies photophobia, eye pain, redness, hearing loss, ear pain, congestion, sore throat, rhinorrhea, sneezing, mouth sores, trouble  swallowing, neck pain, neck stiffness and tinnitus.   Respiratory: Denies SOB, DOE, cough, chest tightness,  and wheezing.   Cardiovascular: Denies chest pain, palpitations and leg swelling.  Gastrointestinal: Denies nausea, vomiting, abdominal pain, diarrhea, constipation, blood in stool and abdominal distention.  Genitourinary: Denies dysuria, urgency, frequency, hematuria, flank pain and difficulty urinating.  Endocrine: Denies: hot or cold intolerance, sweats, changes in hair or nails, polyuria, polydipsia. Musculoskeletal: Denies myalgias, back pain, joint swelling, arthralgias and gait problem.  Skin: Denies pallor, rash and wound.  Neurological: Denies dizziness, seizures, syncope, weakness, light-headedness, numbness and headaches.  Hematological: Denies adenopathy. Easy bruising, personal or family bleeding history  Psychiatric/Behavioral: Denies suicidal ideation, mood changes, confusion, nervousness, sleep disturbance and agitation   Assessment and Plan:  Attention deficit hyperactivity disorder (ADHD), predominantly inattentive type  -Refill Adderall x 3 months.   I discussed the assessment and treatment plan with the patient. The patient was provided an opportunity to ask questions and all were answered. The patient agreed with the plan and demonstrated an understanding of the instructions.   The patient was advised to call back or seek an in-person evaluation if the symptoms worsen or if the condition fails to improve as anticipated.  I provided 12 minutes of non-face-to-face time during this encounter.   Lelon Frohlich, MD [facility] at [facility]

## 2020-01-13 ENCOUNTER — Other Ambulatory Visit: Payer: Self-pay | Admitting: Internal Medicine

## 2020-01-19 ENCOUNTER — Telehealth: Payer: Self-pay | Admitting: Internal Medicine

## 2020-01-19 NOTE — Telephone Encounter (Signed)
Virtual appointment scheduled for refills

## 2020-01-19 NOTE — Telephone Encounter (Signed)
amphetamine-dextroamphetamine (ADDERALL) 10 MG tablet amphetamine-dextroamphetamine (ADDERALL XR) 20 MG 24 hr capsule  [facility] [id number] - [place], [place] - [address] AT [facility] OF [address]  Pt need refill

## 2020-01-20 ENCOUNTER — Telehealth (INDEPENDENT_AMBULATORY_CARE_PROVIDER_SITE_OTHER): Payer: BC Managed Care – PPO | Admitting: Internal Medicine

## 2020-01-20 ENCOUNTER — Other Ambulatory Visit: Payer: Self-pay

## 2020-01-20 DIAGNOSIS — F900 Attention-deficit hyperactivity disorder, predominantly inattentive type: Secondary | ICD-10-CM | POA: Diagnosis not present

## 2020-01-20 MED ORDER — AMPHETAMINE-DEXTROAMPHETAMINE 10 MG PO TABS: 10.0000 mg | ORAL_TABLET | Freq: Every day | ORAL | 0 refills | Status: DC | PRN

## 2020-01-20 MED ORDER — AMPHETAMINE-DEXTROAMPHET ER 20 MG PO CP24: 20.0000 mg | ORAL_CAPSULE | ORAL | 0 refills | Status: DC

## 2020-01-20 MED ORDER — AMPHETAMINE-DEXTROAMPHET ER 20 MG PO CP24: 20.0000 mg | ORAL_CAPSULE | Freq: Every day | ORAL | 0 refills | Status: DC

## 2020-01-20 NOTE — Progress Notes (Signed)
Virtual Visit via Video Note  I connected with Alan Tyler on 01/20/20 at 11:30 AM EST by a video enabled telemedicine application and verified that I am speaking with the correct person using two identifiers.  Location patient: home Location provider: work office Persons participating in the virtual visit: patient, provider  I discussed the limitations of evaluation and management by telemedicine and the availability of in person appointments. The patient expressed understanding and agreed to proceed.   HPI: This is a scheduled visit for Adderall refills. He has a history of ADHD and does well on this medication. He takes 20 mg of extended release in the morning, because he works second shift he will usually take a 10 mg tablet in the evening if necessary. This ends up being about pretty much every day. He has not noticed increased anxiety, hypertension and is otherwise doing well. PDMP has been reviewed and there are no red flags.   ROS: Constitutional: Denies fever, chills, diaphoresis, appetite change and fatigue.  HEENT: Denies photophobia, eye pain, redness, hearing loss, ear pain, congestion, sore throat, rhinorrhea, sneezing, mouth sores, trouble swallowing, neck pain, neck stiffness and tinnitus.   Respiratory: Denies SOB, DOE, cough, chest tightness,  and wheezing.   Cardiovascular: Denies chest pain, palpitations and leg swelling.  Gastrointestinal: Denies nausea, vomiting, abdominal pain, diarrhea, constipation, blood in stool and abdominal distention.  Genitourinary: Denies dysuria, urgency, frequency, hematuria, flank pain and difficulty urinating.  Endocrine: Denies: hot or cold intolerance, sweats, changes in hair or nails, polyuria, polydipsia. Musculoskeletal: Denies myalgias, back pain, joint swelling, arthralgias and gait problem.  Skin: Denies pallor, rash and wound.  Neurological: Denies dizziness, seizures, syncope, weakness, light-headedness, numbness and  headaches.  Hematological: Denies adenopathy. Easy bruising, personal or family bleeding history  Psychiatric/Behavioral: Denies suicidal ideation, mood changes, confusion, nervousness, sleep disturbance and agitation   Past Medical History:  Diagnosis Date  . Arthritis   . Cancer [facility]) 2003   kidney - 2003  . Chronic kidney disease    kidney stones and (R) nephrectomy  . Colon polyps   . Depression   . GERD (gastroesophageal reflux disease)   . Hyperlipidemia   . PVC's (premature ventricular contractions)    arrhythmia    Past Surgical History:  Procedure Laterality Date  . CATARACT EXTRACTION     right eye  . COLONOSCOPY  2014  . NEPHRECTOMY  2003   (R) for cancer tumor  . RETINAL DETACHMENT SURGERY  2012    Family History  Problem Relation Age of Onset  . Arthritis Mother   . Depression Mother   . Colon cancer Mother 42       part of colon was removed  . Alcohol abuse Father   . Heart disease Father   . Hyperlipidemia Father   . Hyperlipidemia Maternal Grandmother   . Hyperlipidemia Maternal Grandfather   . Hyperlipidemia Paternal Grandmother   . Diabetes Paternal Grandmother   . Rectal cancer Paternal Grandmother   . Arthritis Paternal Grandfather   . Hyperlipidemia Paternal Grandfather   . Stomach cancer Neg Hx   . Esophageal cancer Neg Hx     SOCIAL HX:   reports that he quit smoking about 18 years ago. He quit smokeless tobacco use about 17 years ago. He reports current alcohol use of about 4.0 standard drinks of alcohol per week. He reports that he does not use drugs.   Current Outpatient Medications:  .  amphetamine-dextroamphetamine (ADDERALL XR) 20 MG 24 hr  capsule, Take 1 capsule (20 mg total) by mouth every morning., Disp: 30 capsule, Rfl: 0 .  amphetamine-dextroamphetamine (ADDERALL XR) 20 MG 24 hr capsule, Take 1 capsule (20 mg total) by mouth every morning., Disp: 30 capsule, Rfl: 0 .  amphetamine-dextroamphetamine (ADDERALL XR) 20 MG 24 hr  capsule, Take 1 capsule (20 mg total) by mouth daily., Disp: 30 capsule, Rfl: 0 .  amphetamine-dextroamphetamine (ADDERALL) 10 MG tablet, Take 1 tablet (10 mg total) by mouth daily as needed., Disp: 30 tablet, Rfl: 0 .  amphetamine-dextroamphetamine (ADDERALL) 10 MG tablet, Take 1 tablet (10 mg total) by mouth daily as needed., Disp: 30 tablet, Rfl: 0 .  amphetamine-dextroamphetamine (ADDERALL) 10 MG tablet, Take 1 tablet (10 mg total) by mouth daily as needed., Disp: 30 tablet, Rfl: 0 .  B Complex-C (SUPER B COMPLEX PO), Take 1 tablet by mouth daily., Disp: , Rfl:  .  benzonatate (TESSALON) 100 MG capsule, Take 1 capsule (100 mg total) by mouth 2 (two) times daily as needed for cough., Disp: 25 capsule, Rfl: 0 .  Biotin 10000 MCG TABS, Take 1 tablet by mouth daily., Disp: , Rfl:  .  buPROPion (WELLBUTRIN XL) 300 MG 24 hr tablet, TAKE 1 TABLET EVERY MORNING, Disp: 90 tablet, Rfl: 1 .  Cholecalciferol (VITAMIN D PO), Take 1 tablet by mouth daily., Disp: , Rfl:  .  GLUCOSAMINE-CHONDROITIN ER PO, Take 2 tablets by mouth daily., Disp: , Rfl:  .  Multiple Vitamin (ONE-A-DAY MENS PO), Take 1 tablet by mouth daily., Disp: , Rfl:  .  Omega-3 Fatty Acids (OMEGA 3 PO), Take by mouth. Take 3,300mg  (2 capsules) daily, Disp: , Rfl:  .  OVER THE COUNTER MEDICATION, NATURAL PSYLIUM HUSK FIBER 0.52 grams,(5Capsules) daily, Disp: , Rfl:  .  TURMERIC PO, Take 500 mg by mouth daily., Disp: , Rfl:  .  VITAMIN E-400 PO, Take 1 capsule by mouth daily., Disp: , Rfl:   Current Facility-Administered Medications:  .  0.9 %  sodium chloride infusion, 500 mL, Intravenous, Once, Ladene Artist, MD  EXAM:   VITALS per patient if applicable: none reported  GENERAL: alert, oriented, appears well and in no acute distress  HEENT: atraumatic, conjunttiva clear, no obvious abnormalities on inspection of external nose and ears  NECK: normal movements of the head and neck  LUNGS: on inspection no signs of respiratory  distress, breathing rate appears normal, no obvious gross increased work of breathing, gasping or wheezing  CV: no obvious cyanosis  MS: moves all visible extremities without noticeable abnormality  PSYCH/NEURO: pleasant and cooperative, no obvious depression or anxiety, speech and thought processing grossly intact  ASSESSMENT AND PLAN:   Attention deficit hyperactivity disorder (ADHD), predominantly inattentive type  -Refill Adderall x 3 months. -Will schedule in office visit in 3 months for CPE.     I discussed the assessment and treatment plan with the patient. The patient was provided an opportunity to ask questions and all were answered. The patient agreed with the plan and demonstrated an understanding of the instructions.   The patient was advised to call back or seek an in-person evaluation if the symptoms worsen or if the condition fails to improve as anticipated.    Lelon Frohlich, MD  [facility]

## 2020-03-21 ENCOUNTER — Telehealth: Payer: Self-pay | Admitting: Internal Medicine

## 2020-03-21 NOTE — Telephone Encounter (Signed)
Pt is scheduled for a CPE on April 28th. Pt is out of both his adderall medication and wondering if PCP can fill it until he can be seen for his CPE?   Medication Refill: Adderrall 20 MG and 10 MG Pharmacy: [place]: [phone]

## 2020-03-22 NOTE — Telephone Encounter (Signed)
Appointment rescheduled to an earlier date per Dr Jerilee Hoh

## 2020-03-23 ENCOUNTER — Ambulatory Visit (INDEPENDENT_AMBULATORY_CARE_PROVIDER_SITE_OTHER): Payer: BC Managed Care – PPO | Admitting: Internal Medicine

## 2020-03-23 ENCOUNTER — Encounter: Payer: Self-pay | Admitting: Internal Medicine

## 2020-03-23 ENCOUNTER — Other Ambulatory Visit: Payer: Self-pay

## 2020-03-23 ENCOUNTER — Other Ambulatory Visit: Payer: Self-pay | Admitting: Internal Medicine

## 2020-03-23 VITALS — BP 110/80 | HR 87 | Temp 97.7°F | Ht 67.0 in | Wt 257.8 lb

## 2020-03-23 DIAGNOSIS — F900 Attention-deficit hyperactivity disorder, predominantly inattentive type: Secondary | ICD-10-CM | POA: Diagnosis not present

## 2020-03-23 DIAGNOSIS — E785 Hyperlipidemia, unspecified: Secondary | ICD-10-CM | POA: Insufficient documentation

## 2020-03-23 DIAGNOSIS — E538 Deficiency of other specified B group vitamins: Secondary | ICD-10-CM | POA: Insufficient documentation

## 2020-03-23 DIAGNOSIS — F320 Major depressive disorder, single episode, mild: Secondary | ICD-10-CM | POA: Diagnosis not present

## 2020-03-23 DIAGNOSIS — Z0000 Encounter for general adult medical examination without abnormal findings: Secondary | ICD-10-CM | POA: Diagnosis not present

## 2020-03-23 DIAGNOSIS — E559 Vitamin D deficiency, unspecified: Secondary | ICD-10-CM

## 2020-03-23 LAB — LIPID PANEL
Cholesterol: 188 mg/dL (ref 0–200)
HDL: 35 mg/dL — ABNORMAL LOW (ref >39.00)
LDL Cholesterol: 127 mg/dL — ABNORMAL HIGH (ref 0–99)
NonHDL: 152.68
Total CHOL/HDL Ratio: 5
Triglycerides: 129 mg/dL (ref 0.0–149.0)
VLDL: 25.8 mg/dL (ref 0.0–40.0)

## 2020-03-23 LAB — COMPREHENSIVE METABOLIC PANEL
ALT: 33 U/L (ref 0–53)
AST: 22 U/L (ref 0–37)
Albumin: 4.3 g/dL (ref 3.5–5.2)
Alkaline Phosphatase: 69 U/L (ref 39–117)
BUN: 13 mg/dL (ref 6–23)
CO2: 25 mEq/L (ref 19–32)
Calcium: 9.1 mg/dL (ref 8.4–10.5)
Chloride: 104 mEq/L (ref 96–112)
Creatinine, Ser: 1.4 mg/dL (ref 0.40–1.50)
GFR: 51.16 mL/min — ABNORMAL LOW (ref >60.00)
Glucose, Bld: 103 mg/dL — ABNORMAL HIGH (ref 70–99)
Potassium: 4.3 mEq/L (ref 3.5–5.1)
Sodium: 137 mEq/L (ref 135–145)
Total Bilirubin: 1 mg/dL (ref 0.2–1.2)
Total Protein: 6.9 g/dL (ref 6.0–8.3)

## 2020-03-23 LAB — CBC WITH DIFFERENTIAL/PLATELET
Basophils Absolute: 0.1 10*3/uL (ref 0.0–0.1)
Basophils Relative: 0.9 % (ref 0.0–3.0)
Eosinophils Absolute: 0.4 10*3/uL (ref 0.0–0.7)
Eosinophils Relative: 4.8 % (ref 0.0–5.0)
HCT: 44.5 % (ref 39.0–52.0)
Hemoglobin: 15.3 g/dL (ref 13.0–17.0)
Lymphocytes Relative: 25.1 % (ref 12.0–46.0)
Lymphs Abs: 2.1 10*3/uL (ref 0.7–4.0)
MCHC: 34.3 g/dL (ref 30.0–36.0)
MCV: 89.2 fl (ref 78.0–100.0)
Monocytes Absolute: 0.6 10*3/uL (ref 0.1–1.0)
Monocytes Relative: 7.8 % (ref 3.0–12.0)
Neutro Abs: 5.1 10*3/uL (ref 1.4–7.7)
Neutrophils Relative %: 61.4 % (ref 43.0–77.0)
Platelets: 200 10*3/uL (ref 150.0–400.0)
RBC: 5 Mil/uL (ref 4.22–5.81)
RDW: 13.8 % (ref 11.5–15.5)
WBC: 8.3 10*3/uL (ref 4.0–10.5)

## 2020-03-23 LAB — HEMOGLOBIN A1C: Hgb A1c MFr Bld: 5.7 % (ref 4.6–6.5)

## 2020-03-23 LAB — VITAMIN D 25 HYDROXY (VIT D DEFICIENCY, FRACTURES): VITD: 27.07 ng/mL — ABNORMAL LOW (ref 30.00–100.00)

## 2020-03-23 LAB — VITAMIN B12: Vitamin B-12: 198 pg/mL — ABNORMAL LOW (ref 211–911)

## 2020-03-23 LAB — TSH: TSH: 3.6 u[IU]/mL (ref 0.35–4.50)

## 2020-03-23 MED ORDER — AMPHETAMINE-DEXTROAMPHET ER 20 MG PO CP24: 20.0000 mg | ORAL_CAPSULE | ORAL | 0 refills | Status: DC

## 2020-03-23 MED ORDER — AMPHETAMINE-DEXTROAMPHETAMINE 10 MG PO TABS: 10.0000 mg | ORAL_TABLET | Freq: Every day | ORAL | 0 refills | Status: DC | PRN

## 2020-03-23 MED ORDER — AMPHETAMINE-DEXTROAMPHET ER 20 MG PO CP24: 20.0000 mg | ORAL_CAPSULE | Freq: Every day | ORAL | 0 refills | Status: DC

## 2020-03-23 MED ORDER — VITAMIN D (ERGOCALCIFEROL) 1.25 MG (50000 UNIT) PO CAPS: 50000.0000 [IU] | ORAL_CAPSULE | ORAL | 0 refills | Status: AC

## 2020-03-23 NOTE — Patient Instructions (Signed)
-Nice seeing you today!!  -Lab work today; will notify you once results are available.  -Adderall refills today.  -See you back in 3 months.   Preventive Care 36-63 Years Old, Male Preventive care refers to lifestyle choices and visits with your health care provider that can promote health and wellness. This includes:  A yearly physical exam. This is also called an annual well check.  Regular dental and eye exams.  Immunizations.  Screening for certain conditions.  Healthy lifestyle choices, such as eating a healthy diet, getting regular exercise, not using drugs or products that contain nicotine and tobacco, and limiting alcohol use. What can I expect for my preventive care visit? Physical exam Your health care provider will check:  Height and weight. These may be used to calculate body mass index (BMI), which is a measurement that tells if you are at a healthy weight.  Heart rate and blood pressure.  Your skin for abnormal spots. Counseling Your health care provider may ask you questions about:  Alcohol, tobacco, and drug use.  Emotional well-being.  Home and relationship well-being.  Sexual activity.  Eating habits.  Work and work Statistician. What immunizations do I need?  Influenza (flu) vaccine  This is recommended every year. Tetanus, diphtheria, and pertussis (Tdap) vaccine  You may need a Td booster every 10 years. Varicella (chickenpox) vaccine  You may need this vaccine if you have not already been vaccinated. Zoster (shingles) vaccine  You may need this after age 34. Measles, mumps, and rubella (MMR) vaccine  You may need at least one dose of MMR if you were born in 1957 or later. You may also need a second dose. Pneumococcal conjugate (PCV13) vaccine  You may need this if you have certain conditions and were not previously vaccinated. Pneumococcal polysaccharide (PPSV23) vaccine  You may need one or two doses if you smoke cigarettes or if  you have certain conditions. Meningococcal conjugate (MenACWY) vaccine  You may need this if you have certain conditions. Hepatitis A vaccine  You may need this if you have certain conditions or if you travel or work in places where you may be exposed to hepatitis A. Hepatitis B vaccine  You may need this if you have certain conditions or if you travel or work in places where you may be exposed to hepatitis B. Haemophilus influenzae type b (Hib) vaccine  You may need this if you have certain risk factors. Human papillomavirus (HPV) vaccine  If recommended by your health care provider, you may need three doses over 6 months. You may receive vaccines as individual doses or as more than one vaccine together in one shot (combination vaccines). Talk with your health care provider about the risks and benefits of combination vaccines. What tests do I need? Blood tests  Lipid and cholesterol levels. These may be checked every 5 years, or more frequently if you are over 80 years old.  Hepatitis C test.  Hepatitis B test. Screening  Lung cancer screening. You may have this screening every year starting at age 54 if you have a 30-pack-year history of smoking and currently smoke or have quit within the past 15 years.  Prostate cancer screening. Recommendations will vary depending on your family history and other risks.  Colorectal cancer screening. All adults should have this screening starting at age 33 and continuing until age 6. Your health care provider may recommend screening at age 43 if you are at increased risk. You will have tests every 1-10  years, depending on your results and the type of screening test.  Diabetes screening. This is done by checking your blood sugar (glucose) after you have not eaten for a while (fasting). You may have this done every 1-3 years.  Sexually transmitted disease (STD) testing. Follow these instructions at home: Eating and drinking  Eat a diet that  includes fresh fruits and vegetables, whole grains, lean protein, and low-fat dairy products.  Take vitamin and mineral supplements as recommended by your health care provider.  Do not drink alcohol if your health care provider tells you not to drink.  If you drink alcohol: ? Limit how much you have to 0-2 drinks a day. ? Be aware of how much alcohol is in your drink. In the [place] one drink equals one 12 oz bottle of beer (355 mL), one 5 oz glass of wine (148 mL), or one 1 oz glass of hard liquor (44 mL). Lifestyle  Take daily care of your teeth and gums.  Stay active. Exercise for at least 30 minutes on 5 or more days each week.  Do not use any products that contain nicotine or tobacco, such as cigarettes, e-cigarettes, and chewing tobacco. If you need help quitting, ask your health care provider.  If you are sexually active, practice safe sex. Use a condom or other form of protection to prevent STIs (sexually transmitted infections).  Talk with your health care provider about taking a low-dose aspirin every day starting at age 45. What's next?  Go to your health care provider once a year for a well check visit.  Ask your health care provider how often you should have your eyes and teeth checked.  Stay up to date on all vaccines. This information is not intended to replace advice given to you by your health care provider. Make sure you discuss any questions you have with your health care provider. Document Revised: 12/09/2018 Document Reviewed: 12/09/2018 Elsevier Patient Education  2020 [employer].

## 2020-03-23 NOTE — Progress Notes (Signed)
Established Patient Office Visit     This visit occurred during the SARS-CoV-2 public health emergency.  Safety protocols were in place, including screening questions prior to the visit, additional usage of staff PPE, and extensive cleaning of exam room while observing appropriate contact time as indicated for disinfecting solutions.    CC/Reason for Visit: Annual preventive exam and medication refills  HPI: Alan Tyler is a 64 y.o. male who is coming in today for the above mentioned reasons. Past Medical History is significant for: ADHD that is well controlled on Adderall 20 mg in the morning and 2 mg in the evening, depression with stable mood on Wellbutrin and morbid obesity.  He is doing well, he has no complaints.  He received his first Covid vaccination.  He has routine eye and dental care.  He has been exercising and has lost around 8 pounds in the past year.  He walks a mile every day.  He is also due for Adderall refills per contract.   Past Medical/Surgical History: Past Medical History:  Diagnosis Date  . Arthritis   . Cancer [facility]) 2003   kidney - 2003  . Chronic kidney disease    kidney stones and (R) nephrectomy  . Colon polyps   . Depression   . GERD (gastroesophageal reflux disease)   . Hyperlipidemia   . PVC's (premature ventricular contractions)    arrhythmia    Past Surgical History:  Procedure Laterality Date  . CATARACT EXTRACTION     right eye  . COLONOSCOPY  2014  . NEPHRECTOMY  2003   (R) for cancer tumor  . RETINAL DETACHMENT SURGERY  2012    Social History:  reports that he quit smoking about 18 years ago. He quit smokeless tobacco use about 17 years ago. He reports current alcohol use of about 4.0 standard drinks of alcohol per week. He reports that he does not use drugs.  Allergies: Allergies  Allergen Reactions  . Sulfa Drugs Cross Reactors     Per pt: unknown    Family History:  Family History  Problem Relation Age of Onset  .  Arthritis Mother   . Depression Mother   . Colon cancer Mother 81       part of colon was removed  . Alcohol abuse Father   . Heart disease Father   . Hyperlipidemia Father   . Hyperlipidemia Maternal Grandmother   . Hyperlipidemia Maternal Grandfather   . Hyperlipidemia Paternal Grandmother   . Diabetes Paternal Grandmother   . Rectal cancer Paternal Grandmother   . Arthritis Paternal Grandfather   . Hyperlipidemia Paternal Grandfather   . Stomach cancer Neg Hx   . Esophageal cancer Neg Hx      Current Outpatient Medications:  .  amphetamine-dextroamphetamine (ADDERALL XR) 20 MG 24 hr capsule, Take 1 capsule (20 mg total) by mouth every morning., Disp: 30 capsule, Rfl: 0 .  amphetamine-dextroamphetamine (ADDERALL XR) 20 MG 24 hr capsule, Take 1 capsule (20 mg total) by mouth every morning., Disp: 30 capsule, Rfl: 0 .  amphetamine-dextroamphetamine (ADDERALL XR) 20 MG 24 hr capsule, Take 1 capsule (20 mg total) by mouth daily., Disp: 30 capsule, Rfl: 0 .  amphetamine-dextroamphetamine (ADDERALL) 10 MG tablet, Take 1 tablet (10 mg total) by mouth daily as needed., Disp: 30 tablet, Rfl: 0 .  amphetamine-dextroamphetamine (ADDERALL) 10 MG tablet, Take 1 tablet (10 mg total) by mouth daily as needed., Disp: 30 tablet, Rfl: 0 .  amphetamine-dextroamphetamine (ADDERALL) 10 MG  tablet, Take 1 tablet (10 mg total) by mouth daily as needed., Disp: 30 tablet, Rfl: 0 .  B Complex-C (SUPER B COMPLEX PO), Take 1 tablet by mouth daily., Disp: , Rfl:  .  benzonatate (TESSALON) 100 MG capsule, Take 1 capsule (100 mg total) by mouth 2 (two) times daily as needed for cough., Disp: 25 capsule, Rfl: 0 .  Biotin 10000 MCG TABS, Take 1 tablet by mouth daily., Disp: , Rfl:  .  buPROPion (WELLBUTRIN XL) 300 MG 24 hr tablet, TAKE 1 TABLET EVERY MORNING, Disp: 90 tablet, Rfl: 1 .  Cholecalciferol (VITAMIN D PO), Take 1 tablet by mouth daily., Disp: , Rfl:  .  GLUCOSAMINE-CHONDROITIN ER PO, Take 2 tablets by  mouth daily., Disp: , Rfl:  .  Multiple Vitamin (ONE-A-DAY MENS PO), Take 1 tablet by mouth daily., Disp: , Rfl:  .  Omega-3 Fatty Acids (OMEGA 3 PO), Take by mouth. Take 3,349m (2 capsules) daily, Disp: , Rfl:  .  OVER THE COUNTER MEDICATION, NATURAL PSYLIUM HUSK FIBER 0.52 grams,(5Capsules) daily, Disp: , Rfl:  .  TURMERIC PO, Take 500 mg by mouth daily., Disp: , Rfl:  .  VITAMIN E-400 PO, Take 1 capsule by mouth daily., Disp: , Rfl:   Current Facility-Administered Medications:  .  0.9 %  sodium chloride infusion, 500 mL, Intravenous, Once, SLadene Artist MD  Review of Systems:  Constitutional: Denies fever, chills, diaphoresis, appetite change and fatigue.  HEENT: Denies photophobia, eye pain, redness, hearing loss, ear pain, congestion, sore throat, rhinorrhea, sneezing, mouth sores, trouble swallowing, neck pain, neck stiffness and tinnitus.   Respiratory: Denies SOB, DOE, cough, chest tightness,  and wheezing.   Cardiovascular: Denies chest pain, palpitations and leg swelling.  Gastrointestinal: Denies nausea, vomiting, abdominal pain, diarrhea, constipation, blood in stool and abdominal distention.  Genitourinary: Denies dysuria, urgency, frequency, hematuria, flank pain and difficulty urinating.  Endocrine: Denies: hot or cold intolerance, sweats, changes in hair or nails, polyuria, polydipsia. Musculoskeletal: Denies myalgias, back pain, joint swelling, arthralgias and gait problem.  Skin: Denies pallor, rash and wound.  Neurological: Denies dizziness, seizures, syncope, weakness, light-headedness, numbness and headaches.  Hematological: Denies adenopathy. Easy bruising, personal or family bleeding history  Psychiatric/Behavioral: Denies suicidal ideation, mood changes, confusion, nervousness, sleep disturbance and agitation    Physical Exam: Vitals:   03/23/20 1000  BP: 110/80  Pulse: 87  Temp: 97.7 F (36.5 C)  TempSrc: Temporal  SpO2: 98%  Weight: 257 lb 12.8 oz  (116.9 kg)  Height: _0  (1.702 m)    Body mass index is 40.38 kg/m.   Constitutional: NAD, calm, comfortable, obese Eyes: PERRL, lids and conjunctivae normal, wears corrective lenses ENMT: Mucous membranes are moist. Tympanic membrane is pearly white, no erythema or bulging. Neck: normal, supple, no masses, no thyromegaly Respiratory: clear to auscultation bilaterally, no wheezing, no crackles. Normal respiratory effort. No accessory muscle use.  Cardiovascular: Regular rate and rhythm, no murmurs / rubs / gallops. No extremity edema. 2+ pedal pulses.  Abdomen: no tenderness, no masses palpated. No hepatosplenomegaly. Bowel sounds positive.  Musculoskeletal: no clubbing / cyanosis. No joint deformity upper and lower extremities. Good ROM, no contractures. Normal muscle tone.  Skin: no rashes, lesions, ulcers. No induration Neurologic: CN 2-12 grossly intact. Sensation intact, DTR normal. Strength 5/5 in all 4.  Psychiatric: Normal judgment and insight. Alert and oriented x 3. Normal mood.    Impression and Plan:  Encounter for preventive health examination  -He has routine eye and  dental care. -His immunizations are up-to-date, he received his first Covid vaccination. -Screening labs today. -Healthy lifestyle discussed in detail. -He had a colonoscopy in 2019 and is a 5-year callback. -Check PSA for prostate cancer screening.  Attention deficit hyperactivity disorder (ADHD), predominantly inattentive type -PDMP reviewed, no red flags, overdose risk for 200. -Refill Adderall x3 months.  Morbid obesity ([place])  -Discussed healthy lifestyle, including increased physical activity and better food choices to promote weight loss.  Current mild episode of major depressive disorder, unspecified whether recurrent ([place])    Office Visit from 03/23/2020 in [place] at [place]  PHQ-9 Total Score  0     -Well-controlled on Wellbutrin.    Patient Instructions  -Nice  seeing you today!!  -Lab work today; will notify you once results are available.  -Adderall refills today.  -See you back in 3 months.   Preventive Care 22-76 Years Old, Male Preventive care refers to lifestyle choices and visits with your health care provider that can promote health and wellness. This includes:  A yearly physical exam. This is also called an annual well check.  Regular dental and eye exams.  Immunizations.  Screening for certain conditions.  Healthy lifestyle choices, such as eating a healthy diet, getting regular exercise, not using drugs or products that contain nicotine and tobacco, and limiting alcohol use. What can I expect for my preventive care visit? Physical exam Your health care provider will check:  Height and weight. These may be used to calculate body mass index (BMI), which is a measurement that tells if you are at a healthy weight.  Heart rate and blood pressure.  Your skin for abnormal spots. Counseling Your health care provider may ask you questions about:  Alcohol, tobacco, and drug use.  Emotional well-being.  Home and relationship well-being.  Sexual activity.  Eating habits.  Work and work Statistician. What immunizations do I need?  Influenza (flu) vaccine  This is recommended every year. Tetanus, diphtheria, and pertussis (Tdap) vaccine  You may need a Td booster every 10 years. Varicella (chickenpox) vaccine  You may need this vaccine if you have not already been vaccinated. Zoster (shingles) vaccine  You may need this after age 24. Measles, mumps, and rubella (MMR) vaccine  You may need at least one dose of MMR if you were born in 1957 or later. You may also need a second dose. Pneumococcal conjugate (PCV13) vaccine  You may need this if you have certain conditions and were not previously vaccinated. Pneumococcal polysaccharide (PPSV23) vaccine  You may need one or two doses if you smoke cigarettes or if you  have certain conditions. Meningococcal conjugate (MenACWY) vaccine  You may need this if you have certain conditions. Hepatitis A vaccine  You may need this if you have certain conditions or if you travel or work in places where you may be exposed to hepatitis A. Hepatitis B vaccine  You may need this if you have certain conditions or if you travel or work in places where you may be exposed to hepatitis B. Haemophilus influenzae type b (Hib) vaccine  You may need this if you have certain risk factors. Human papillomavirus (HPV) vaccine  If recommended by your health care provider, you may need three doses over 6 months. You may receive vaccines as individual doses or as more than one vaccine together in one shot (combination vaccines). Talk with your health care provider about the risks and benefits of combination vaccines. What tests do I need?  Blood tests  Lipid and cholesterol levels. These may be checked every 5 years, or more frequently if you are over 49 years old.  Hepatitis C test.  Hepatitis B test. Screening  Lung cancer screening. You may have this screening every year starting at age 27 if you have a 30-pack-year history of smoking and currently smoke or have quit within the past 15 years.  Prostate cancer screening. Recommendations will vary depending on your family history and other risks.  Colorectal cancer screening. All adults should have this screening starting at age 14 and continuing until age 43. Your health care provider may recommend screening at age 5 if you are at increased risk. You will have tests every 1-10 years, depending on your results and the type of screening test.  Diabetes screening. This is done by checking your blood sugar (glucose) after you have not eaten for a while (fasting). You may have this done every 1-3 years.  Sexually transmitted disease (STD) testing. Follow these instructions at home: Eating and drinking  Eat a diet that  includes fresh fruits and vegetables, whole grains, lean protein, and low-fat dairy products.  Take vitamin and mineral supplements as recommended by your health care provider.  Do not drink alcohol if your health care provider tells you not to drink.  If you drink alcohol: ? Limit how much you have to 0-2 drinks a day. ? Be aware of how much alcohol is in your drink. In the [place] one drink equals one 12 oz bottle of beer (355 mL), one 5 oz glass of wine (148 mL), or one 1 oz glass of hard liquor (44 mL). Lifestyle  Take daily care of your teeth and gums.  Stay active. Exercise for at least 30 minutes on 5 or more days each week.  Do not use any products that contain nicotine or tobacco, such as cigarettes, e-cigarettes, and chewing tobacco. If you need help quitting, ask your health care provider.  If you are sexually active, practice safe sex. Use a condom or other form of protection to prevent STIs (sexually transmitted infections).  Talk with your health care provider about taking a low-dose aspirin every day starting at age 28. What's next?  Go to your health care provider once a year for a well check visit.  Ask your health care provider how often you should have your eyes and teeth checked.  Stay up to date on all vaccines. This information is not intended to replace advice given to you by your health care provider. Make sure you discuss any questions you have with your health care provider. Document Revised: 12/09/2018 Document Reviewed: 12/09/2018 Elsevier Patient Education  2020 [place], MD [facility]

## 2020-03-26 ENCOUNTER — Ambulatory Visit: Payer: Self-pay

## 2020-03-28 ENCOUNTER — Ambulatory Visit (INDEPENDENT_AMBULATORY_CARE_PROVIDER_SITE_OTHER): Payer: BC Managed Care – PPO | Admitting: *Deleted

## 2020-03-28 ENCOUNTER — Other Ambulatory Visit: Payer: Self-pay

## 2020-03-28 DIAGNOSIS — E538 Deficiency of other specified B group vitamins: Secondary | ICD-10-CM | POA: Diagnosis not present

## 2020-03-28 MED ORDER — CYANOCOBALAMIN 1000 MCG/ML IJ SOLN
1000.0000 ug | Freq: Once | INTRAMUSCULAR | Status: AC
  Administered 2020-03-28: 1000 ug via INTRAMUSCULAR

## 2020-03-28 NOTE — Progress Notes (Signed)
Per orders of Dr. Hernandez, injection of B12 given by Kasper Mudrick. Patient tolerated injection well.  

## 2020-04-19 ENCOUNTER — Encounter: Payer: Self-pay | Admitting: Internal Medicine

## 2020-04-24 ENCOUNTER — Other Ambulatory Visit: Payer: Self-pay

## 2020-04-25 ENCOUNTER — Ambulatory Visit (INDEPENDENT_AMBULATORY_CARE_PROVIDER_SITE_OTHER): Payer: BC Managed Care – PPO

## 2020-04-25 ENCOUNTER — Ambulatory Visit: Payer: BC Managed Care – PPO | Admitting: Internal Medicine

## 2020-04-25 DIAGNOSIS — E538 Deficiency of other specified B group vitamins: Secondary | ICD-10-CM | POA: Diagnosis not present

## 2020-04-25 MED ORDER — CYANOCOBALAMIN 1000 MCG/ML IJ SOLN
1000.0000 ug | Freq: Once | INTRAMUSCULAR | Status: AC
  Administered 2020-04-25: 1000 ug via INTRAMUSCULAR

## 2020-04-25 NOTE — Patient Instructions (Signed)
Health Maintenance Due  Topic Date Due  . HIV Screening  Never done  . COVID-19 Vaccine (1) Never done    Depression screen [facility] 2/9 03/23/2020 12/03/2018 04/08/2017  Decreased Interest 0 0 0  Down, Depressed, Hopeless 0 0 0  PHQ - 2 Score 0 0 0  Altered sleeping 0 0 -  Tired, decreased energy 0 1 -  Change in appetite 0 0 -  Feeling bad or failure about yourself  0 0 -  Trouble concentrating 0 0 -  Moving slowly or fidgety/restless 0 0 -  Suicidal thoughts 0 0 -  PHQ-9 Score 0 1 -  Difficult doing work/chores Not difficult at all - -

## 2020-04-25 NOTE — Progress Notes (Signed)
Per orders of Jerilee Hoh, injection of B12 given in Left deltoid by Franco Collet. Patient tolerated injection well.

## 2020-05-01 ENCOUNTER — Other Ambulatory Visit: Payer: Self-pay

## 2020-05-02 ENCOUNTER — Ambulatory Visit (INDEPENDENT_AMBULATORY_CARE_PROVIDER_SITE_OTHER): Payer: BC Managed Care – PPO

## 2020-05-02 DIAGNOSIS — E538 Deficiency of other specified B group vitamins: Secondary | ICD-10-CM | POA: Diagnosis not present

## 2020-05-02 MED ORDER — CYANOCOBALAMIN 1000 MCG/ML IJ SOLN
1000.0000 ug | Freq: Once | INTRAMUSCULAR | Status: AC
  Administered 2020-05-02: 1000 ug via INTRAMUSCULAR

## 2020-05-02 NOTE — Progress Notes (Signed)
Per orders of , injection of B12 given in   deltoid by Franco Collet. Patient tolerated injection well.

## 2020-05-02 NOTE — Patient Instructions (Signed)
Health Maintenance Due  Topic Date Due  . HIV Screening  Never done  . COVID-19 Vaccine (1) Never done    Depression screen [facility] 2/9 03/23/2020 12/03/2018 04/08/2017  Decreased Interest 0 0 0  Down, Depressed, Hopeless 0 0 0  PHQ - 2 Score 0 0 0  Altered sleeping 0 0 -  Tired, decreased energy 0 1 -  Change in appetite 0 0 -  Feeling bad or failure about yourself  0 0 -  Trouble concentrating 0 0 -  Moving slowly or fidgety/restless 0 0 -  Suicidal thoughts 0 0 -  PHQ-9 Score 0 1 -  Difficult doing work/chores Not difficult at all - -

## 2020-05-08 ENCOUNTER — Other Ambulatory Visit: Payer: Self-pay

## 2020-05-09 ENCOUNTER — Ambulatory Visit (INDEPENDENT_AMBULATORY_CARE_PROVIDER_SITE_OTHER): Payer: BC Managed Care – PPO | Admitting: *Deleted

## 2020-05-09 DIAGNOSIS — E538 Deficiency of other specified B group vitamins: Secondary | ICD-10-CM

## 2020-05-09 MED ORDER — CYANOCOBALAMIN 1000 MCG/ML IJ SOLN
1000.0000 ug | Freq: Once | INTRAMUSCULAR | Status: AC
  Administered 2020-05-09: 1000 ug via INTRAMUSCULAR

## 2020-05-09 NOTE — Progress Notes (Signed)
Per orders of Dr. Koberlein, injection of B12 given by Rickita Forstner. Patient tolerated injection well.  

## 2020-05-16 ENCOUNTER — Ambulatory Visit: Payer: BC Managed Care – PPO

## 2020-06-14 ENCOUNTER — Other Ambulatory Visit: Payer: Self-pay

## 2020-06-15 ENCOUNTER — Ambulatory Visit (INDEPENDENT_AMBULATORY_CARE_PROVIDER_SITE_OTHER): Payer: BC Managed Care – PPO

## 2020-06-15 ENCOUNTER — Other Ambulatory Visit: Payer: Self-pay | Admitting: Internal Medicine

## 2020-06-15 DIAGNOSIS — E538 Deficiency of other specified B group vitamins: Secondary | ICD-10-CM | POA: Diagnosis not present

## 2020-06-15 DIAGNOSIS — E559 Vitamin D deficiency, unspecified: Secondary | ICD-10-CM

## 2020-06-15 MED ORDER — CYANOCOBALAMIN 1000 MCG/ML IJ SOLN
1000.0000 ug | Freq: Once | INTRAMUSCULAR | Status: AC
  Administered 2020-06-15: 1000 ug via INTRAMUSCULAR

## 2020-06-15 NOTE — Progress Notes (Signed)
Per orders of Dr. Jerilee Hoh injection of Cyanocobalamin 1,000 mcg/mL GIven by Wyvonne Lenz. Patient tolerated injection well.

## 2020-06-19 ENCOUNTER — Other Ambulatory Visit: Payer: Self-pay

## 2020-06-20 ENCOUNTER — Ambulatory Visit (INDEPENDENT_AMBULATORY_CARE_PROVIDER_SITE_OTHER): Payer: BC Managed Care – PPO | Admitting: Internal Medicine

## 2020-06-20 ENCOUNTER — Encounter: Payer: Self-pay | Admitting: Internal Medicine

## 2020-06-20 VITALS — BP 110/80 | HR 96 | Temp 97.6°F | Wt 266.2 lb

## 2020-06-20 DIAGNOSIS — E559 Vitamin D deficiency, unspecified: Secondary | ICD-10-CM

## 2020-06-20 DIAGNOSIS — F900 Attention-deficit hyperactivity disorder, predominantly inattentive type: Secondary | ICD-10-CM | POA: Diagnosis not present

## 2020-06-20 LAB — VITAMIN D 25 HYDROXY (VIT D DEFICIENCY, FRACTURES): VITD: 46.37 ng/mL (ref 30.00–100.00)

## 2020-06-20 MED ORDER — AMPHETAMINE-DEXTROAMPHET ER 20 MG PO CP24: 20.0000 mg | ORAL_CAPSULE | ORAL | 0 refills | Status: DC

## 2020-06-20 MED ORDER — AMPHETAMINE-DEXTROAMPHETAMINE 10 MG PO TABS: 10.0000 mg | ORAL_TABLET | Freq: Every day | ORAL | 0 refills | Status: DC | PRN

## 2020-06-20 MED ORDER — AMPHETAMINE-DEXTROAMPHET ER 20 MG PO CP24: 20.0000 mg | ORAL_CAPSULE | Freq: Every day | ORAL | 0 refills | Status: DC

## 2020-06-20 NOTE — Progress Notes (Signed)
Established Patient Office Visit     This visit occurred during the SARS-CoV-2 public health emergency.  Safety protocols were in place, including screening questions prior to the visit, additional usage of staff PPE, and extensive cleaning of exam room while observing appropriate contact time as indicated for disinfecting solutions.    CC/Reason for Visit: Medication refills, vitamin D recheck  HPI: Alan Tyler is a 64 y.o. male who is coming in today for the above mentioned reasons. Past Medical History is significant for: ADHD on Adderall 20 mg plus an extra 10 mg in the afternoon, he is due for refills today.  Also history of hyperlipidemia not on medications, well-controlled depression on Wellbutrin, vitamin D deficiency who just completed 12 weeks of high-dose weekly supplementation as well as morbid obesity.  He is due for Adderall refills today.  As usual is tolerating medication well without complaints.  He has no new concerns or complaints.  Since I last saw him he has completed both of his Covid vaccinations.   Past Medical/Surgical History: Past Medical History:  Diagnosis Date  . Arthritis   . Cancer [facility] 2003   kidney - 2003  . Chronic kidney disease    kidney stones and (R) nephrectomy  . Colon polyps   . Depression   . GERD (gastroesophageal reflux disease)   . Hyperlipidemia   . PVC's (premature ventricular contractions)    arrhythmia    Past Surgical History:  Procedure Laterality Date  . CATARACT EXTRACTION     right eye  . COLONOSCOPY  2014  . NEPHRECTOMY  2003   (R) for cancer tumor  . RETINAL DETACHMENT SURGERY  2012    Social History:  reports that he quit smoking about 18 years ago. He quit smokeless tobacco use about 17 years ago. He reports current alcohol use of about 4.0 standard drinks of alcohol per week. He reports that he does not use drugs.  Allergies: Allergies  Allergen Reactions  . Sulfa Drugs Cross Reactors     Per pt:  unknown    Family History:  Family History  Problem Relation Age of Onset  . Arthritis Mother   . Depression Mother   . Colon cancer Mother 17       part of colon was removed  . Alcohol abuse Father   . Heart disease Father   . Hyperlipidemia Father   . Hyperlipidemia Maternal Grandmother   . Hyperlipidemia Maternal Grandfather   . Hyperlipidemia Paternal Grandmother   . Diabetes Paternal Grandmother   . Rectal cancer Paternal Grandmother   . Arthritis Paternal Grandfather   . Hyperlipidemia Paternal Grandfather   . Stomach cancer Neg Hx   . Esophageal cancer Neg Hx      Current Outpatient Medications:  .  amphetamine-dextroamphetamine (ADDERALL XR) 20 MG 24 hr capsule, Take 1 capsule (20 mg total) by mouth every morning., Disp: 30 capsule, Rfl: 0 .  amphetamine-dextroamphetamine (ADDERALL XR) 20 MG 24 hr capsule, Take 1 capsule (20 mg total) by mouth every morning., Disp: 30 capsule, Rfl: 0 .  amphetamine-dextroamphetamine (ADDERALL XR) 20 MG 24 hr capsule, Take 1 capsule (20 mg total) by mouth daily., Disp: 30 capsule, Rfl: 0 .  amphetamine-dextroamphetamine (ADDERALL) 10 MG tablet, Take 1 tablet (10 mg total) by mouth daily as needed., Disp: 30 tablet, Rfl: 0 .  amphetamine-dextroamphetamine (ADDERALL) 10 MG tablet, Take 1 tablet (10 mg total) by mouth daily as needed., Disp: 30 tablet, Rfl: 0 .  amphetamine-dextroamphetamine (  ADDERALL) 10 MG tablet, Take 1 tablet (10 mg total) by mouth daily as needed., Disp: 30 tablet, Rfl: 0 .  B Complex-C (SUPER B COMPLEX PO), Take 1 tablet by mouth daily., Disp: , Rfl:  .  Biotin 10000 MCG TABS, Take 1 tablet by mouth daily., Disp: , Rfl:  .  buPROPion (WELLBUTRIN XL) 300 MG 24 hr tablet, TAKE 1 TABLET EVERY MORNING, Disp: 90 tablet, Rfl: 1 .  Cholecalciferol (VITAMIN D PO), Take 1 tablet by mouth daily., Disp: , Rfl:  .  Multiple Vitamin (ONE-A-DAY MENS PO), Take 1 tablet by mouth daily., Disp: , Rfl:  .  Omega-3 Fatty Acids (OMEGA 3  PO), Take by mouth. Take 3,300mg  (2 capsules) daily, Disp: , Rfl:  .  OVER THE COUNTER MEDICATION, NATURAL PSYLIUM HUSK FIBER 0.52 grams,(5Capsules) daily, Disp: , Rfl:  .  TURMERIC PO, Take 500 mg by mouth daily., Disp: , Rfl:   Current Facility-Administered Medications:  .  0.9 %  sodium chloride infusion, 500 mL, Intravenous, Once, Ladene Artist, MD  Review of Systems:  Constitutional: Denies fever, chills, diaphoresis, appetite change and fatigue.  HEENT: Denies photophobia, eye pain, redness, hearing loss, ear pain, congestion, sore throat, rhinorrhea, sneezing, mouth sores, trouble swallowing, neck pain, neck stiffness and tinnitus.   Respiratory: Denies SOB, DOE, cough, chest tightness,  and wheezing.   Cardiovascular: Denies chest pain, palpitations and leg swelling.  Gastrointestinal: Denies nausea, vomiting, abdominal pain, diarrhea, constipation, blood in stool and abdominal distention.  Genitourinary: Denies dysuria, urgency, frequency, hematuria, flank pain and difficulty urinating.  Endocrine: Denies: hot or cold intolerance, sweats, changes in hair or nails, polyuria, polydipsia. Musculoskeletal: Denies myalgias, back pain, joint swelling, arthralgias and gait problem.  Skin: Denies pallor, rash and wound.  Neurological: Denies dizziness, seizures, syncope, weakness, light-headedness, numbness and headaches.  Hematological: Denies adenopathy. Easy bruising, personal or family bleeding history  Psychiatric/Behavioral: Denies suicidal ideation, mood changes, confusion, nervousness, sleep disturbance and agitation    Physical Exam: Vitals:   06/20/20 1137  BP: 110/80  Pulse: 96  Temp: 97.6 F (36.4 C)  TempSrc: Temporal  SpO2: 98%  Weight: 266 lb 3.2 oz (120.7 kg)    Body mass index is 41.69 kg/m.   Constitutional: NAD, calm, comfortable Eyes: PERRL, lids and conjunctivae normal, wears corrective lenses ENMT: Mucous membranes are moist.  Respiratory: clear to  auscultation bilaterally, no wheezing, no crackles. Normal respiratory effort. No accessory muscle use.  Cardiovascular: Regular rate and rhythm, no murmurs / rubs / gallops. No extremity edema.  Neurologic: Grossly intact and nonfocal. Psychiatric: Normal judgment and insight. Alert and oriented x 3. Normal mood.    Impression and Plan:  Vitamin D deficiency  - Plan: VITAMIN D 25 Hydroxy (Vit-D Deficiency, Fractures)  Attention deficit hyperactivity disorder (ADHD), predominantly inattentive type  -PDMP reviewed, no red flags, overdose risk score 200. -Refill Adderall x3 months.   Patient Instructions  -Nice seeing you today!!  -Lab work today; will notify you once results are available.  -Schedule follow up in 3 months.     Lelon Frohlich, MD [facility]

## 2020-06-20 NOTE — Patient Instructions (Signed)
-  Nice seeing you today!!  -Lab work today; will notify you once results are available.  -Schedule follow up in 3 months. 

## 2020-06-22 ENCOUNTER — Encounter: Payer: Self-pay | Admitting: Internal Medicine

## 2020-06-22 ENCOUNTER — Other Ambulatory Visit: Payer: Self-pay | Admitting: Internal Medicine

## 2020-06-22 DIAGNOSIS — E559 Vitamin D deficiency, unspecified: Secondary | ICD-10-CM

## 2020-06-27 ENCOUNTER — Other Ambulatory Visit (INDEPENDENT_AMBULATORY_CARE_PROVIDER_SITE_OTHER): Payer: BC Managed Care – PPO

## 2020-06-27 ENCOUNTER — Other Ambulatory Visit: Payer: Self-pay

## 2020-06-27 DIAGNOSIS — Z0000 Encounter for general adult medical examination without abnormal findings: Secondary | ICD-10-CM | POA: Diagnosis not present

## 2020-06-27 LAB — PSA: PSA: 1.65 ng/mL (ref 0.10–4.00)

## 2020-07-09 ENCOUNTER — Ambulatory Visit (INDEPENDENT_AMBULATORY_CARE_PROVIDER_SITE_OTHER): Payer: BC Managed Care – PPO | Admitting: Ophthalmology

## 2020-07-09 ENCOUNTER — Other Ambulatory Visit: Payer: Self-pay

## 2020-07-09 ENCOUNTER — Encounter (INDEPENDENT_AMBULATORY_CARE_PROVIDER_SITE_OTHER): Payer: Self-pay | Admitting: Ophthalmology

## 2020-07-09 ENCOUNTER — Encounter: Payer: Self-pay | Admitting: Internal Medicine

## 2020-07-09 DIAGNOSIS — H2702 Aphakia, left eye: Secondary | ICD-10-CM | POA: Diagnosis not present

## 2020-07-09 DIAGNOSIS — G4730 Sleep apnea, unspecified: Secondary | ICD-10-CM

## 2020-07-09 DIAGNOSIS — M25561 Pain in right knee: Secondary | ICD-10-CM

## 2020-07-09 DIAGNOSIS — Z8669 Personal history of other diseases of the nervous system and sense organs: Secondary | ICD-10-CM | POA: Diagnosis not present

## 2020-07-09 DIAGNOSIS — H35352 Cystoid macular degeneration, left eye: Secondary | ICD-10-CM | POA: Diagnosis not present

## 2020-07-09 NOTE — Patient Instructions (Signed)

## 2020-07-09 NOTE — Progress Notes (Signed)
07/09/2020     CHIEF COMPLAINT Patient presents for Retina Follow Up   HISTORY OF PRESENT ILLNESS: Alan Tyler is a 64 y.o. male who presents to the clinic today for:   HPI    Retina Follow Up    Patient presents with  Other.  In both eyes.  Duration of 1 year.  Since onset it is stable.          Comments    1 year follow up - OCT OU, FP OU Patient denies change in vision and overall has no complaints.        Last edited by Gerda Diss on 07/09/2020 10:04 AM. (History)      Referring physician: Isaac Bliss, Rayford Halsted, MD [place],  [place] [address]  HISTORICAL INFORMATION:   Selected notes from the MEDICAL RECORD NUMBER    Lab Results  Component Value Date   HGBA1C 5.7 03/23/2020     CURRENT MEDICATIONS: No current outpatient medications on file. (Ophthalmic Drugs)   No current facility-administered medications for this visit. (Ophthalmic Drugs)   Current Outpatient Medications (Other)  Medication Sig  . amphetamine-dextroamphetamine (ADDERALL XR) 20 MG 24 hr capsule Take 1 capsule (20 mg total) by mouth every morning.  Marland Kitchen amphetamine-dextroamphetamine (ADDERALL XR) 20 MG 24 hr capsule Take 1 capsule (20 mg total) by mouth every morning.  Marland Kitchen amphetamine-dextroamphetamine (ADDERALL XR) 20 MG 24 hr capsule Take 1 capsule (20 mg total) by mouth daily.  Marland Kitchen amphetamine-dextroamphetamine (ADDERALL) 10 MG tablet Take 1 tablet (10 mg total) by mouth daily as needed.  Marland Kitchen amphetamine-dextroamphetamine (ADDERALL) 10 MG tablet Take 1 tablet (10 mg total) by mouth daily as needed.  Marland Kitchen amphetamine-dextroamphetamine (ADDERALL) 10 MG tablet Take 1 tablet (10 mg total) by mouth daily as needed.  . B Complex-C (SUPER B COMPLEX PO) Take 1 tablet by mouth daily.  . Biotin 10000 MCG TABS Take 1 tablet by mouth daily.  Marland Kitchen buPROPion (WELLBUTRIN XL) 300 MG 24 hr tablet TAKE 1 TABLET EVERY MORNING  . Cholecalciferol (VITAMIN D PO) Take 1 tablet by mouth daily.  .  Multiple Vitamin (ONE-A-DAY MENS PO) Take 1 tablet by mouth daily.  . Omega-3 Fatty Acids (OMEGA 3 PO) Take by mouth. Take 3,300mg  (2 capsules) daily  . OVER THE COUNTER MEDICATION NATURAL PSYLIUM HUSK FIBER 0.52 grams,(5Capsules) daily  . TURMERIC PO Take 500 mg by mouth daily.   Current Facility-Administered Medications (Other)  Medication Route  . 0.9 %  sodium chloride infusion Intravenous      REVIEW OF SYSTEMS:    ALLERGIES Allergies  Allergen Reactions  . Sulfa Drugs Cross Reactors     Per pt: unknown    PAST MEDICAL HISTORY Past Medical History:  Diagnosis Date  . Arthritis   . Cancer [facility]) 2003   kidney - 2003  . Chronic kidney disease    kidney stones and (R) nephrectomy  . Colon polyps   . Depression   . GERD (gastroesophageal reflux disease)   . Hyperlipidemia   . PVC's (premature ventricular contractions)    arrhythmia   Past Surgical History:  Procedure Laterality Date  . CATARACT EXTRACTION     right eye  . COLONOSCOPY  2014  . NEPHRECTOMY  2003   (R) for cancer tumor  . RETINAL DETACHMENT SURGERY  2012    FAMILY HISTORY Family History  Problem Relation Age of Onset  . Arthritis Mother   . Depression Mother   . Colon cancer  Mother 66       part of colon was removed  . Alcohol abuse Father   . Heart disease Father   . Hyperlipidemia Father   . Hyperlipidemia Maternal Grandmother   . Hyperlipidemia Maternal Grandfather   . Hyperlipidemia Paternal Grandmother   . Diabetes Paternal Grandmother   . Rectal cancer Paternal Grandmother   . Arthritis Paternal Grandfather   . Hyperlipidemia Paternal Grandfather   . Stomach cancer Neg Hx   . Esophageal cancer Neg Hx     SOCIAL HISTORY Social History   Tobacco Use  . Smoking status: Former Smoker    Quit date: 12/29/2001    Years since quitting: 18.5  . Smokeless tobacco: Former Systems developer    Quit date: 12/27/2002  Vaping Use  . Vaping Use: Never used  Substance Use Topics  . Alcohol use:  Yes    Alcohol/week: 4.0 standard drinks    Types: 4 Standard drinks or equivalent per week  . Drug use: No         OPHTHALMIC EXAM:  Base Eye Exam    Visual Acuity (Snellen - Linear)      Right Left   Dist cc 20/20 CF @ 5'   Dist ph cc  20/200       Tonometry (Tonopen, 10:09 AM)      Right Left   Pressure 12 10       Pupils      Pupils Dark Light Shape React APD   Right PERRL 4 4 Round Slow None   Left PERRL 3 3 Round Slow +1       Visual Fields (Counting fingers)      Left Right     Full   Restrictions Partial outer superior temporal deficiency        Extraocular Movement      Right Left    Full Full       Neuro/Psych    Oriented x3: Yes   Mood/Affect: Normal       Dilation    Both eyes: 1.0% Mydriacyl, 2.5% Phenylephrine @ 10:09 AM        Slit Lamp and Fundus Exam    External Exam      Right Left   External Normal Normal       Slit Lamp Exam      Right Left   Lids/Lashes Normal Normal   Conjunctiva/Sclera White and quiet White and quiet   Cornea Clear Clear   Anterior Chamber Deep and quiet Deep and quiet   Iris Round and reactive Round and reactive   Lens Posterior chamber intraocular lens Aphakia   Anterior Vitreous Normal Normal       Fundus Exam      Right Left   Posterior Vitreous Normal Clear, vitrectomized   Disc Normal Normal   C/D Ratio 0.1 0.25   Macula Normal Normal   Vessels Normal Normal   Periphery Good scleral buckle and cryopexy, retinopexy superotemporal and encircling  Good scleral buckle 360 with cryopexy and retinopexy.          IMAGING AND PROCEDURES  Imaging and Procedures for 07/09/20  OCT, Retina - OU - Both Eyes       Right Eye Quality was good. Scan locations included subfoveal. Central Foveal Thickness: 305. Findings include normal observations.   Left Eye Quality was good. Scan locations included subfoveal. Central Foveal Thickness: 324. Progression has been stable. Findings include cystoid  macular edema.   Notes OD, normal  with a very minor topographic changes temporal to the fovea suggestive of minor epiretinal membrane nondistorted    OS  Perifoveal CME, Stable year over year                ASSESSMENT/PLAN:  No problem-specific Assessment & Plan notes found for this encounter.      ICD-10-CM   1. Cystoid macular edema of left eye  H35.352 OCT, Retina - OU - Both Eyes  2. Aphakia of eye, left  H27.02   3. History of retinal detachment  Z86.69     1.  Patient has review of systems that are slightly positive for sleep apnea.  He does snore when he sleeps on his back, and he does awaken more than once nightly for nocturia.  At times often feels tired to take a nap  2.  Might consider evaluation with his primary care physician and consideration of sleep testing to look for sleep apnea because of its effects upon macular circulation studied in this office  3.  Aphakia OS, will discuss with the patient possible option of aphakic correction via vitrectomy, secondary insertion of posterior chamber intraocular lens using the Yamani scleral tunnel technique with Zeiss intraocular lens  Ophthalmic Meds Ordered this visit:  No orders of the defined types were placed in this encounter.      Return in about 6 months (around 01/09/2021) for OCT, dilate, OS.  There are no Patient Instructions on file for this visit.   Explained the diagnoses, plan, and follow up with the patient and they expressed understanding.  Patient expressed understanding of the importance of proper follow up care.   Clent Demark Montel Vanderhoof M.D. Diseases & Surgery of the Retina and Vitreous [facility] [place] 07/09/20     Abbreviations: M myopia (nearsighted); A astigmatism; H hyperopia (farsighted); P presbyopia; Mrx spectacle prescription;  CTL contact lenses; OD right eye; OS left eye; OU both eyes  XT exotropia; ET esotropia; PEK punctate epithelial keratitis; PEE punctate epithelial  erosions; DES dry eye syndrome; MGD meibomian gland dysfunction; ATs artificial tears; PFAT's preservative free artificial tears; [place] nuclear sclerotic cataract; PSC posterior subcapsular cataract; ERM epi-retinal membrane; PVD posterior vitreous detachment; RD retinal detachment; DM diabetes mellitus; DR diabetic retinopathy; NPDR non-proliferative diabetic retinopathy; PDR proliferative diabetic retinopathy; CSME clinically significant macular edema; DME diabetic macular edema; dbh dot blot hemorrhages; CWS cotton wool spot; POAG primary open angle glaucoma; C/D cup-to-disc ratio; HVF humphrey visual field; GVF goldmann visual field; OCT optical coherence tomography; IOP intraocular pressure; BRVO Branch retinal vein occlusion; CRVO central retinal vein occlusion; CRAO central retinal artery occlusion; BRAO branch retinal artery occlusion; RT retinal tear; SB scleral buckle; PPV pars plana vitrectomy; VH Vitreous hemorrhage; PRP panretinal laser photocoagulation; IVK intravitreal kenalog; VMT vitreomacular traction; MH Macular hole;  NVD neovascularization of the disc; NVE neovascularization elsewhere; AREDS age related eye disease study; ARMD age related macular degeneration; POAG primary open angle glaucoma; EBMD epithelial/anterior basement membrane dystrophy; ACIOL anterior chamber intraocular lens; IOL intraocular lens; PCIOL posterior chamber intraocular lens; Phaco/IOL phacoemulsification with intraocular lens placement; [place] photorefractive keratectomy; LASIK laser assisted in situ keratomileusis; HTN hypertension; DM diabetes mellitus; COPD chronic obstructive pulmonary disease

## 2020-07-10 NOTE — Telephone Encounter (Signed)
Okay for pulmonology and ortho referral?

## 2020-07-11 ENCOUNTER — Other Ambulatory Visit: Payer: Self-pay | Admitting: Internal Medicine

## 2020-07-12 ENCOUNTER — Encounter (INDEPENDENT_AMBULATORY_CARE_PROVIDER_SITE_OTHER): Payer: Self-pay | Admitting: Ophthalmology

## 2020-07-18 DIAGNOSIS — M170 Bilateral primary osteoarthritis of knee: Secondary | ICD-10-CM | POA: Diagnosis not present

## 2020-07-19 ENCOUNTER — Encounter (INDEPENDENT_AMBULATORY_CARE_PROVIDER_SITE_OTHER): Payer: Self-pay | Admitting: Ophthalmology

## 2020-09-20 ENCOUNTER — Other Ambulatory Visit: Payer: Self-pay

## 2020-09-20 ENCOUNTER — Encounter: Payer: Self-pay | Admitting: Internal Medicine

## 2020-09-20 ENCOUNTER — Ambulatory Visit: Payer: BC Managed Care – PPO | Admitting: Internal Medicine

## 2020-09-20 VITALS — BP 130/80 | HR 90 | Temp 98.0°F | Wt 207.8 lb

## 2020-09-20 DIAGNOSIS — F900 Attention-deficit hyperactivity disorder, predominantly inattentive type: Secondary | ICD-10-CM | POA: Diagnosis not present

## 2020-09-20 DIAGNOSIS — M25511 Pain in right shoulder: Secondary | ICD-10-CM | POA: Diagnosis not present

## 2020-09-20 DIAGNOSIS — Z23 Encounter for immunization: Secondary | ICD-10-CM

## 2020-09-20 DIAGNOSIS — E559 Vitamin D deficiency, unspecified: Secondary | ICD-10-CM | POA: Diagnosis not present

## 2020-09-20 MED ORDER — AMPHETAMINE-DEXTROAMPHETAMINE 10 MG PO TABS: 10.0000 mg | ORAL_TABLET | Freq: Every day | ORAL | 0 refills | Status: DC | PRN

## 2020-09-20 MED ORDER — AMPHETAMINE-DEXTROAMPHET ER 20 MG PO CP24: 20.0000 mg | ORAL_CAPSULE | Freq: Every day | ORAL | 0 refills | Status: DC

## 2020-09-20 MED ORDER — AMPHETAMINE-DEXTROAMPHET ER 20 MG PO CP24: 20.0000 mg | ORAL_CAPSULE | ORAL | 0 refills | Status: DC

## 2020-09-20 NOTE — Progress Notes (Signed)
Established Patient Office Visit     This visit occurred during the SARS-CoV-2 public health emergency.  Safety protocols were in place, including screening questions prior to the visit, additional usage of staff PPE, and extensive cleaning of exam room while observing appropriate contact time as indicated for disinfecting solutions.    CC/Reason for Visit: Medication refills, discuss an acute concern  HPI: Alan Tyler is a 64 y.o. male who is coming in today for the above mentioned reasons.  He is here today mainly for medication refills of Adderall per his contract.  He takes 20 mg in the morning and an additional 10 mg in the afternoon.  He does well on this dose and has been taking it for many years.  A few weeks ago he was loading heavy bags of mulch into his car and ever since then his right shoulder has been tender.  He still has range of motion but has pain with extreme ranges of motion.  He is also requesting a flu vaccine today.   Past Medical/Surgical History: Past Medical History:  Diagnosis Date  . Arthritis   . Cancer [facility]) 2003   kidney - 2003  . Chronic kidney disease    kidney stones and (R) nephrectomy  . Colon polyps   . Depression   . GERD (gastroesophageal reflux disease)   . Hyperlipidemia   . PVC's (premature ventricular contractions)    arrhythmia    Past Surgical History:  Procedure Laterality Date  . CATARACT EXTRACTION     right eye  . COLONOSCOPY  2014  . NEPHRECTOMY  2003   (R) for cancer tumor  . RETINAL DETACHMENT SURGERY  2012    Social History:  reports that he quit smoking about 18 years ago. He quit smokeless tobacco use about 17 years ago. He reports current alcohol use of about 4.0 standard drinks of alcohol per week. He reports that he does not use drugs.  Allergies: Allergies  Allergen Reactions  . Sulfa Drugs Cross Reactors     Per pt: unknown    Family History:  Family History  Problem Relation Age of Onset  .  Arthritis Mother   . Depression Mother   . Colon cancer Mother 72       part of colon was removed  . Alcohol abuse Father   . Heart disease Father   . Hyperlipidemia Father   . Hyperlipidemia Maternal Grandmother   . Hyperlipidemia Maternal Grandfather   . Hyperlipidemia Paternal Grandmother   . Diabetes Paternal Grandmother   . Rectal cancer Paternal Grandmother   . Arthritis Paternal Grandfather   . Hyperlipidemia Paternal Grandfather   . Stomach cancer Neg Hx   . Esophageal cancer Neg Hx      Current Outpatient Medications:  .  amphetamine-dextroamphetamine (ADDERALL XR) 20 MG 24 hr capsule, Take 1 capsule (20 mg total) by mouth every morning., Disp: 30 capsule, Rfl: 0 .  amphetamine-dextroamphetamine (ADDERALL XR) 20 MG 24 hr capsule, Take 1 capsule (20 mg total) by mouth every morning., Disp: 30 capsule, Rfl: 0 .  amphetamine-dextroamphetamine (ADDERALL XR) 20 MG 24 hr capsule, Take 1 capsule (20 mg total) by mouth daily., Disp: 30 capsule, Rfl: 0 .  amphetamine-dextroamphetamine (ADDERALL) 10 MG tablet, Take 1 tablet (10 mg total) by mouth daily as needed., Disp: 30 tablet, Rfl: 0 .  amphetamine-dextroamphetamine (ADDERALL) 10 MG tablet, Take 1 tablet (10 mg total) by mouth daily as needed., Disp: 30 tablet, Rfl: 0 .  amphetamine-dextroamphetamine (ADDERALL) 10 MG tablet, Take 1 tablet (10 mg total) by mouth daily as needed., Disp: 30 tablet, Rfl: 0 .  B Complex-C (SUPER B COMPLEX PO), Take 1 tablet by mouth daily., Disp: , Rfl:  .  Biotin 10000 MCG TABS, Take 1 tablet by mouth daily., Disp: , Rfl:  .  buPROPion (WELLBUTRIN XL) 300 MG 24 hr tablet, TAKE 1 TABLET EVERY MORNING, Disp: 90 tablet, Rfl: 1 .  Cholecalciferol (VITAMIN D PO), Take 1 tablet by mouth daily., Disp: , Rfl:  .  Multiple Vitamin (ONE-A-DAY MENS PO), Take 1 tablet by mouth daily., Disp: , Rfl:  .  Omega-3 Fatty Acids (OMEGA 3 PO), Take by mouth. Take 3,300mg  (2 capsules) daily, Disp: , Rfl:  .  OVER THE  COUNTER MEDICATION, NATURAL PSYLIUM HUSK FIBER 0.52 grams,(5Capsules) daily, Disp: , Rfl:  .  TURMERIC PO, Take 500 mg by mouth daily., Disp: , Rfl:   Current Facility-Administered Medications:  .  0.9 %  sodium chloride infusion, 500 mL, Intravenous, Once, Ladene Artist, MD  Review of Systems:  Constitutional: Denies fever, chills, diaphoresis, appetite change and fatigue.  HEENT: Denies photophobia, eye pain, redness, hearing loss, ear pain, congestion, sore throat, rhinorrhea, sneezing, mouth sores, trouble swallowing, neck pain, neck stiffness and tinnitus.   Respiratory: Denies SOB, DOE, cough, chest tightness,  and wheezing.   Cardiovascular: Denies chest pain, palpitations and leg swelling.  Gastrointestinal: Denies nausea, vomiting, abdominal pain, diarrhea, constipation, blood in stool and abdominal distention.  Genitourinary: Denies dysuria, urgency, frequency, hematuria, flank pain and difficulty urinating.  Endocrine: Denies: hot or cold intolerance, sweats, changes in hair or nails, polyuria, polydipsia. Musculoskeletal: Denies  back pain and gait problem.  Skin: Denies pallor, rash and wound.  Neurological: Denies dizziness, seizures, syncope, weakness, light-headedness, numbness and headaches.  Hematological: Denies adenopathy. Easy bruising, personal or family bleeding history  Psychiatric/Behavioral: Denies suicidal ideation, mood changes, confusion, nervousness, sleep disturbance and agitation    Physical Exam: Vitals:   09/20/20 1000  BP: 130/80  Pulse: 90  Temp: 98 F (36.7 C)  TempSrc: Oral  SpO2: 98%  Weight: 207 lb 12.8 oz (94.3 kg)    Body mass index is 32.55 kg/m.   Constitutional: NAD, calm, comfortable Eyes: PERRL, lids and conjunctivae normal, wears corrective lenses ENMT: Mucous membranes are moist. Respiratory: clear to auscultation bilaterally, no wheezing, no crackles. Normal respiratory effort. No accessory muscle use.  Cardiovascular:  Regular rate and rhythm, no murmurs / rubs / gallops. No extremity edema.   Musculoskeletal: He has good range of motion of his right shoulder in abduction, adduction and rotation. Neurologic: Grossly intact and nonfocal Psychiatric: Normal judgment and insight. Alert and oriented x 3. Normal mood.    Impression and Plan:  Vitamin D deficiency -His levels have normalized as per last visit, he is taking over-the-counter vitamin D.  Attention deficit hyperactivity disorder (ADHD), predominantly inattentive type  -PDMP reviewed, no red flags, overdose risk score is 200. -Refill Adderall 20 mg and 10 mg to take 1 pill of each every day x3 months.  Acute pain of right shoulder -Suspect mild rotator cuff injury, advised icing, as needed NSAIDs and exercises that have been provided today. -He will notify me if no improvement.  Need for influenza vaccination -Flu vaccine administered today.    Patient Instructions  -Nice seeing you today!!  -Flu vaccine today.  -For your shoulder: icing for 15 mins twice a day and exercises provided daily.  -Schedule virtual visit  in 3 months.     Lelon Frohlich, MD [facility]

## 2020-09-20 NOTE — Addendum Note (Signed)
Addended by: Westley Hummer B on: 09/20/2020 11:16 AM   Modules accepted: Orders

## 2020-09-20 NOTE — Patient Instructions (Signed)
-  Nice seeing you today!!  -Flu vaccine today.  -For your shoulder: icing for 15 mins twice a day and exercises provided daily.  -Schedule virtual visit in 3 months.

## 2020-11-29 ENCOUNTER — Encounter: Payer: Self-pay | Admitting: Family Medicine

## 2020-11-29 ENCOUNTER — Telehealth (INDEPENDENT_AMBULATORY_CARE_PROVIDER_SITE_OTHER): Payer: BC Managed Care – PPO | Admitting: Family Medicine

## 2020-11-29 DIAGNOSIS — R0981 Nasal congestion: Secondary | ICD-10-CM

## 2020-11-29 DIAGNOSIS — R059 Cough, unspecified: Secondary | ICD-10-CM

## 2020-11-29 MED ORDER — BENZONATATE 100 MG PO CAPS: 100.0000 mg | ORAL_CAPSULE | Freq: Three times a day (TID) | ORAL | 0 refills | Status: DC | PRN

## 2020-11-29 MED ORDER — AMOXICILLIN-POT CLAVULANATE 875-125 MG PO TABS: 1.0000 | ORAL_TABLET | Freq: Two times a day (BID) | ORAL | 0 refills | Status: DC

## 2020-11-29 NOTE — Progress Notes (Signed)
Virtual Visit via Video Note  I connected with Alan Tyler  on 11/29/20 at  3:20 PM EST by a video enabled telemedicine application and verified that I am speaking with the correct person using two identifiers.  Location patient: home, [place] Location provider:work or home office Persons participating in the virtual visit: patient, provider  I discussed the limitations of evaluation and management by telemedicine and the availability of in person appointments. The patient expressed understanding and agreed to proceed.   HPI:  Acute telemedicine visit for cough and congestion: -Onset: 1 week ago -Symptoms include: nasal congestion, some body aches, cough, sneezing, laryngitis, fatigue, some worsening sinus issues with some sinus discomfort -Denies: fevers, SOB, CP, NVD, loss of taste or smell -Has tried: nothing except OTC decongestant last night -Pertinent past medical history: see below -Pertinent medication allergies:sulfa -COVID-19 vaccine status: fully vaccinated for [place]; has had flu shot  ROS: See pertinent positives and negatives per HPI.  Past Medical History:  Diagnosis Date  . Arthritis   . Cancer [facility]) 2003   kidney - 2003  . Chronic kidney disease    kidney stones and (R) nephrectomy  . Colon polyps   . Depression   . GERD (gastroesophageal reflux disease)   . Hyperlipidemia   . PVC's (premature ventricular contractions)    arrhythmia    Past Surgical History:  Procedure Laterality Date  . CATARACT EXTRACTION     right eye  . COLONOSCOPY  2014  . NEPHRECTOMY  2003   (R) for cancer tumor  . RETINAL DETACHMENT SURGERY  2012     Current Outpatient Medications:  .  amphetamine-dextroamphetamine (ADDERALL XR) 20 MG 24 hr capsule, Take 1 capsule (20 mg total) by mouth every morning., Disp: 30 capsule, Rfl: 0 .  amphetamine-dextroamphetamine (ADDERALL XR) 20 MG 24 hr capsule, Take 1 capsule (20 mg total) by mouth every morning., Disp: 30 capsule, Rfl: 0 .   amphetamine-dextroamphetamine (ADDERALL XR) 20 MG 24 hr capsule, Take 1 capsule (20 mg total) by mouth daily., Disp: 30 capsule, Rfl: 0 .  amphetamine-dextroamphetamine (ADDERALL) 10 MG tablet, Take 1 tablet (10 mg total) by mouth daily as needed., Disp: 30 tablet, Rfl: 0 .  amphetamine-dextroamphetamine (ADDERALL) 10 MG tablet, Take 1 tablet (10 mg total) by mouth daily as needed., Disp: 30 tablet, Rfl: 0 .  amphetamine-dextroamphetamine (ADDERALL) 10 MG tablet, Take 1 tablet (10 mg total) by mouth daily as needed., Disp: 30 tablet, Rfl: 0 .  B Complex-C (SUPER B COMPLEX PO), Take 1 tablet by mouth daily., Disp: , Rfl:  .  Biotin 10000 MCG TABS, Take 1 tablet by mouth daily., Disp: , Rfl:  .  buPROPion (WELLBUTRIN XL) 300 MG 24 hr tablet, TAKE 1 TABLET EVERY MORNING, Disp: 90 tablet, Rfl: 1 .  Cholecalciferol (VITAMIN D PO), Take 1 tablet by mouth daily., Disp: , Rfl:  .  Multiple Vitamin (ONE-A-DAY MENS PO), Take 1 tablet by mouth daily., Disp: , Rfl:  .  Omega-3 Fatty Acids (OMEGA 3 PO), Take by mouth. Take 3,300mg  (2 capsules) daily, Disp: , Rfl:  .  OVER THE COUNTER MEDICATION, NATURAL PSYLIUM HUSK FIBER 0.52 grams,(5Capsules) daily, Disp: , Rfl:  .  TURMERIC PO, Take 500 mg by mouth daily., Disp: , Rfl:  .  amoxicillin-clavulanate (AUGMENTIN) 875-125 MG tablet, Take 1 tablet by mouth 2 (two) times daily., Disp: 20 tablet, Rfl: 0 .  benzonatate (TESSALON PERLES) 100 MG capsule, Take 1 capsule (100 mg total) by mouth 3 (three)  times daily as needed., Disp: 20 capsule, Rfl: 0  Current Facility-Administered Medications:  .  0.9 %  sodium chloride infusion, 500 mL, Intravenous, Once, Ladene Artist, MD  EXAM:  VITALS per patient if applicable:  GENERAL: alert, oriented, appears well and in no acute distress  HEENT: atraumatic, conjunttiva clear, no obvious abnormalities on inspection of external nose and ears  NECK: normal movements of the head and neck  LUNGS: on inspection no signs  of respiratory distress, breathing rate appears normal, no obvious gross SOB, gasping or wheezing  CV: no obvious cyanosis  MS: moves all visible extremities without noticeable abnormality  PSYCH/NEURO: pleasant and cooperative, no obvious depression or anxiety, speech and thought processing grossly intact  ASSESSMENT AND PLAN:  Discussed the following assessment and plan:  Sinus congestion  Cough  -we discussed possible serious and likely etiologies, options for evaluation and workup, limitations of telemedicine visit vs in person visit, treatment, treatment risks and precautions. Pt prefers to treat via telemedicine empirically rather than in person at this moment.  Query viral upper respiratory illness, possible influenza, possible Covid 19, possible developing sinusitis versus other.  He opted to start with a trial of nasal saline, short course of nasal decongestant and Tessalon for cough.  Did send Augmentin 875 twice daily for 7 to 10 days if sinus issues are not improving over the next several days.  Also discussed options for Covid testing, possible treatment, potential complications and precautions.  Advised staying home while sick and per protocol if Covid testing positive. Work/School slipped offered:declined Scheduled follow up with PCP offered: He agrees to follow-up if needed.. Advised to seek prompt in person care if worsening, new symptoms arise, or if is not improving with treatment. Discussed options for inperson care if PCP office not available. Did let this patient know that I only do telemedicine on Tuesdays and Thursdays for [place]. Advised to schedule follow up visit with PCP or [facility] if any further questions or concerns to avoid delays in care.   I discussed the assessment and treatment plan with the patient. The patient was provided an opportunity to ask questions and all were answered. The patient agreed with the plan and demonstrated an understanding of the  instructions.     Lucretia Kern, DO

## 2020-11-29 NOTE — Patient Instructions (Signed)
  HOME CARE TIPS:  -[place] testing information: [email or web address] OR [phone] Most pharmacies also offer testing and home test kits.  -I sent the medication(s) we discussed to your pharmacy: Meds ordered this encounter  Medications  . benzonatate (TESSALON PERLES) 100 MG capsule    Sig: Take 1 capsule (100 mg total) by mouth 3 (three) times daily as needed.    Dispense:  20 capsule    Refill:  0  . amoxicillin-clavulanate (AUGMENTIN) 875-125 MG tablet To use if symptoms are not improving with other treatments over the next few days.    Sig: Take 1 tablet by mouth 2 (two) times daily.    Dispense:  20 tablet    Refill:  0     -COVID19 outpatient treatment center: [phone] (only call if your Covid test is positive and you are interested in monoclonal antibody treatment which is available to those with risk factors within 10 days of symptom onset)  -can use tylenol or aleve if needed for fevers, aches and pains per instructions  -can use nasal saline a few times per day if nasal congestion, sometime a short course of Afrin nasal spray for 3 days can help as well  -stay hydrated, drink plenty of fluids and eat small healthy meals - avoid dairy  -If the Covid test is positive, check out the [facility] website for more information on home care, transmission and treatment for COVID19  -follow up with your doctor in 2-3 days unless improving and feeling better  -stay home while sick, except to seek medical care, and if you have [place] please stay home for a full 10 days since the onset of symptoms PLUS one day of no fever and feeling better.  It was nice to meet you today, and I really hope you are feeling better soon. I help [facility] out with telemedicine visits on Tuesdays and Thursdays and am available for visits on those days. If you have any concerns or questions following this visit please schedule a follow up visit with your  Primary Care doctor or seek care at a local urgent care clinic to avoid delays in care.    Seek in person care promptly if your symptoms worsen, new concerns arise or you are not improving with treatment. Call 911 and/or seek emergency care if you symptoms are severe or life threatening.

## 2020-11-30 DIAGNOSIS — R059 Cough, unspecified: Secondary | ICD-10-CM | POA: Diagnosis not present

## 2020-11-30 DIAGNOSIS — Z20822 Contact with and (suspected) exposure to covid-19: Secondary | ICD-10-CM | POA: Diagnosis not present

## 2020-12-14 ENCOUNTER — Telehealth: Payer: BC Managed Care – PPO | Admitting: Internal Medicine

## 2020-12-14 ENCOUNTER — Other Ambulatory Visit: Payer: Self-pay | Admitting: *Deleted

## 2020-12-14 DIAGNOSIS — F900 Attention-deficit hyperactivity disorder, predominantly inattentive type: Secondary | ICD-10-CM

## 2020-12-14 MED ORDER — AMPHETAMINE-DEXTROAMPHET ER 20 MG PO CP24: 20.0000 mg | ORAL_CAPSULE | ORAL | 0 refills | Status: DC

## 2020-12-14 MED ORDER — AMPHETAMINE-DEXTROAMPHETAMINE 10 MG PO TABS: 10.0000 mg | ORAL_TABLET | Freq: Every day | ORAL | 0 refills | Status: DC | PRN

## 2020-12-14 MED ORDER — AMPHETAMINE-DEXTROAMPHET ER 20 MG PO CP24: 20.0000 mg | ORAL_CAPSULE | Freq: Every day | ORAL | 0 refills | Status: DC

## 2020-12-14 NOTE — Telephone Encounter (Signed)
Patient would like to wish Dr Jerilee Hoh a happy birthday.

## 2020-12-20 ENCOUNTER — Telehealth: Payer: BC Managed Care – PPO | Admitting: Internal Medicine

## 2021-01-07 ENCOUNTER — Other Ambulatory Visit: Payer: Self-pay | Admitting: Internal Medicine

## 2021-01-09 ENCOUNTER — Encounter (INDEPENDENT_AMBULATORY_CARE_PROVIDER_SITE_OTHER): Payer: Self-pay | Admitting: Ophthalmology

## 2021-01-09 ENCOUNTER — Ambulatory Visit (INDEPENDENT_AMBULATORY_CARE_PROVIDER_SITE_OTHER): Payer: BC Managed Care – PPO | Admitting: Ophthalmology

## 2021-01-09 ENCOUNTER — Other Ambulatory Visit: Payer: Self-pay

## 2021-01-09 DIAGNOSIS — H2702 Aphakia, left eye: Secondary | ICD-10-CM | POA: Diagnosis not present

## 2021-01-09 DIAGNOSIS — H35352 Cystoid macular degeneration, left eye: Secondary | ICD-10-CM

## 2021-01-09 NOTE — Progress Notes (Signed)
01/09/2021     CHIEF COMPLAINT Patient presents for Retina Follow Up (6 Month f\u OS. OCT/Pt states vision is stable. Denies FOL and floaters. Pt did not have a sleep test )   HISTORY OF PRESENT ILLNESS: Alan Tyler is a 65 y.o. male who presents to the clinic today for:   HPI    Retina Follow Up    Diagnosis: CME.  In left eye.  Severity is moderate.  Duration of 6 months.  Since onset it is stable.  I, the attending physician,  performed the HPI with the patient and updated documentation appropriately. Additional comments: 6 Month f\u OS. OCT Pt states vision is stable. Denies FOL and floaters. Pt did not have a sleep test        Last edited by Tilda Franco on 01/09/2021  9:38 AM. (History)      Referring physician: Isaac Bliss, Rayford Halsted, MD [place],   [id number]  HISTORICAL INFORMATION:   Selected notes from the MEDICAL RECORD NUMBER    Lab Results  Component Value Date   HGBA1C 5.7 03/23/2020     CURRENT MEDICATIONS: No current outpatient medications on file. (Ophthalmic Drugs)   No current facility-administered medications for this visit. (Ophthalmic Drugs)   Current Outpatient Medications (Other)  Medication Sig  . amoxicillin-clavulanate (AUGMENTIN) 875-125 MG tablet Take 1 tablet by mouth 2 (two) times daily.  Marland Kitchen amphetamine-dextroamphetamine (ADDERALL XR) 20 MG 24 hr capsule Take 1 capsule (20 mg total) by mouth every morning.  Marland Kitchen amphetamine-dextroamphetamine (ADDERALL XR) 20 MG 24 hr capsule Take 1 capsule (20 mg total) by mouth every morning.  Marland Kitchen amphetamine-dextroamphetamine (ADDERALL XR) 20 MG 24 hr capsule Take 1 capsule (20 mg total) by mouth daily.  Marland Kitchen amphetamine-dextroamphetamine (ADDERALL) 10 MG tablet Take 1 tablet (10 mg total) by mouth daily as needed.  Marland Kitchen amphetamine-dextroamphetamine (ADDERALL) 10 MG tablet Take 1 tablet (10 mg total) by mouth daily as needed.  Marland Kitchen amphetamine-dextroamphetamine (ADDERALL) 10 MG  tablet Take 1 tablet (10 mg total) by mouth daily as needed.  . B Complex-C (SUPER B COMPLEX PO) Take 1 tablet by mouth daily.  . benzonatate (TESSALON PERLES) 100 MG capsule Take 1 capsule (100 mg total) by mouth 3 (three) times daily as needed.  . Biotin 10000 MCG TABS Take 1 tablet by mouth daily.  Marland Kitchen buPROPion (WELLBUTRIN XL) 300 MG 24 hr tablet TAKE 1 TABLET EVERY MORNING  . Cholecalciferol (VITAMIN D PO) Take 1 tablet by mouth daily.  . Multiple Vitamin (ONE-A-DAY MENS PO) Take 1 tablet by mouth daily.  . Omega-3 Fatty Acids (OMEGA 3 PO) Take by mouth. Take 3,300mg  (2 capsules) daily  . OVER THE COUNTER MEDICATION NATURAL PSYLIUM HUSK FIBER 0.52 grams,(5Capsules) daily  . TURMERIC PO Take 500 mg by mouth daily.   Current Facility-Administered Medications (Other)  Medication Route  . 0.9 %  sodium chloride infusion Intravenous      REVIEW OF SYSTEMS:    ALLERGIES Allergies  Allergen Reactions  . Sulfa Drugs Cross Reactors     Per pt: unknown    PAST MEDICAL HISTORY Past Medical History:  Diagnosis Date  . Arthritis   . Cancer [facility]) 2003   kidney - 2003  . Chronic kidney disease    kidney stones and (R) nephrectomy  . Colon polyps   . Depression   . GERD (gastroesophageal reflux disease)   . Hyperlipidemia   . PVC's (premature ventricular contractions)    arrhythmia  Past Surgical History:  Procedure Laterality Date  . CATARACT EXTRACTION     right eye  . COLONOSCOPY  2014  . NEPHRECTOMY  2003   (R) for cancer tumor  . RETINAL DETACHMENT SURGERY  2012    FAMILY HISTORY Family History  Problem Relation Age of Onset  . Arthritis Mother   . Depression Mother   . Colon cancer Mother 67       part of colon was removed  . Alcohol abuse Father   . Heart disease Father   . Hyperlipidemia Father   . Hyperlipidemia Maternal Grandmother   . Hyperlipidemia Maternal Grandfather   . Hyperlipidemia Paternal Grandmother   . Diabetes Paternal Grandmother   .  Rectal cancer Paternal Grandmother   . Arthritis Paternal Grandfather   . Hyperlipidemia Paternal Grandfather   . Stomach cancer Neg Hx   . Esophageal cancer Neg Hx     SOCIAL HISTORY Social History   Tobacco Use  . Smoking status: Former Smoker    Quit date: 12/29/2001    Years since quitting: 19.0  . Smokeless tobacco: Former Systems developer    Quit date: 12/27/2002  Vaping Use  . Vaping Use: Never used  Substance Use Topics  . Alcohol use: Yes    Alcohol/week: 4.0 standard drinks    Types: 4 Standard drinks or equivalent per week  . Drug use: No         OPHTHALMIC EXAM:  Base Eye Exam    Visual Acuity (Snellen - Linear)      Right Left   Dist cc 20/20 CF @ 5'   Correction: Glasses       Tonometry (Tonopen, 9:41 AM)      Right Left   Pressure 8 12       Pupils      Pupils Dark Light Shape React APD   Right PERRL 3 2 Round Slow None   Left PERRL 3 3 Round Minimal None       Visual Fields (Counting fingers)      Left Right     Full   Restrictions Partial outer superior temporal, superior nasal deficiencies        Neuro/Psych    Oriented x3: Yes   Mood/Affect: Normal       Dilation    Left eye: 1.0% Mydriacyl, 2.5% Phenylephrine @ 9:41 AM        Slit Lamp and Fundus Exam    External Exam      Right Left   External Normal Normal       Slit Lamp Exam      Right Left   Lids/Lashes Normal Normal   Conjunctiva/Sclera White and quiet White and quiet   Cornea Clear Clear   Anterior Chamber Deep and quiet Deep and quiet   Iris Round and reactive Round and reactive, inferior patent PI   Lens Posterior chamber intraocular lens Aphakia   Anterior Vitreous Normal Normal       Fundus Exam      Right Left   Posterior Vitreous  Clear, vitrectomized   Disc  Normal   C/D Ratio  0.25   Macula  Normal   Vessels  Normal   Periphery Normal Good scleral buckle 360 with cryopexy and retinopexy.  Retina attached 360          IMAGING AND PROCEDURES  Imaging and  Procedures for 01/09/21  OCT, Retina - OU - Both Eyes       Right Eye  Quality was good. Scan locations included subfoveal. Central Foveal Thickness: 305. Progression has been stable. Findings include normal foveal contour.   Left Eye Quality was good. Scan locations included subfoveal. Central Foveal Thickness: 317. Progression has been stable. Findings include abnormal foveal contour, cystoid macular edema.   Notes Minor chronic perifoveal CME which is been unchanged now for many years left eye,No overt thickening, will thus observe                ASSESSMENT/PLAN:  Cystoid macular edema of left eye Minor stable no active thickening we will observe  Aphakia of eye, left OS, with a aphakia with potentially benefit fourth improved peripheral vision and binocular fusion by correction of aphakic condition.  In the past we have avoided use of anterior chamber intraocular lens or sutured PCIOL due to the risk of CME progression given the history of retinal detachment in the side.  I have discussed with the patient however that placement of a Yamani scleral tunnel Zeiss CT [place] 602 lens is an excellent solution for his consideration.  While not without potential for complications at the time of insertion or thereafter, the minimal complication rate is now a risk-benefit ratio that benefits his long-term visual needs solution.  I have explained the patient have done approximately 17-20 of these over the last 18 months with nice anatomic and visual outcomes.      ICD-10-CM   1. Cystoid macular edema of left eye  H35.352 OCT, Retina - OU - Both Eyes  2. Aphakia of eye, left  H27.02     1.  2.  3.  Ophthalmic Meds Ordered this visit:  No orders of the defined types were placed in this encounter.      Return in about 6 months (around 07/09/2021) for dilate, OS, OCT.  There are no Patient Instructions on file for this visit.   Explained the diagnoses, plan, and follow up  with the patient and they expressed understanding.  Patient expressed understanding of the importance of proper follow up care.   Clent Demark Dystany Duffy M.D. Diseases & Surgery of the Retina and Vitreous [facility]ina [facility] Di[facility] [place] 01/09/21     Abbreviations: M myopia (nearsighted); A astigmatism; H hyperopia (farsighted); P presbyopia; Mrx spectacle prescription;  CTL contact lenses; OD right eye; OS left eye; OU both eyes  XT exotropia; ET esotropia; PEK punctate epithelial keratitis; PEE punctate epithelial erosions; DES dry eye syndrome; MGD meibomian gland dysfunction; ATs artificial tears; PFAT's preservative free artificial tears; [place] nuclear sclerotic cataract; PSC posterior subcapsular cataract; ERM epi-retinal membrane; PVD posterior vitreous detachment; RD retinal detachment; DM diabetes mellitus; DR diabetic retinopathy; NPDR non-proliferative diabetic retinopathy; PDR proliferative diabetic retinopathy; CSME clinically significant macular edema; DME diabetic macular edema; dbh dot blot hemorrhages; CWS cotton wool spot; POAG primary open angle glaucoma; C/D cup-to-disc ratio; HVF humphrey visual field; GVF goldmann visual field; OCT optical coherence tomography; IOP intraocular pressure; BRVO Branch retinal vein occlusion; CRVO central retinal vein occlusion; CRAO central retinal artery occlusion; BRAO branch retinal artery occlusion; RT retinal tear; SB scleral buckle; PPV pars plana vitrectomy; VH Vitreous hemorrhage; PRP panretinal laser photocoagulation; IVK intravitreal kenalog; VMT vitreomacular traction; MH Macular hole;  NVD neovascularization of the disc; NVE neovascularization elsewhere; AREDS age related eye disease study; ARMD age related macular degeneration; POAG primary open angle glaucoma; EBMD epithelial/anterior basement membrane dystrophy; ACIOL anterior chamber intraocular lens; IOL intraocular lens; PCIOL posterior chamber intraocular lens; Phaco/IOL phacoemulsification with  intraocular lens placement; [place]  photorefractive keratectomy; LASIK laser assisted in situ keratomileusis; HTN hypertension; DM diabetes mellitus; COPD chronic obstructive pulmonary disease

## 2021-01-09 NOTE — Assessment & Plan Note (Signed)
Minor stable no active thickening we will observe

## 2021-01-09 NOTE — Assessment & Plan Note (Signed)
OS, with a aphakia with potentially benefit fourth improved peripheral vision and binocular fusion by correction of aphakic condition.  In the past we have avoided use of anterior chamber intraocular lens or sutured PCIOL due to the risk of CME progression given the history of retinal detachment in the side.  I have discussed with the patient however that placement of a Yamani scleral tunnel Zeiss CT [place] 602 lens is an excellent solution for his consideration.  While not without potential for complications at the time of insertion or thereafter, the minimal complication rate is now a risk-benefit ratio that benefits his long-term visual needs solution.  I have explained the patient have done approximately 17-20 of these over the last 18 months with nice anatomic and visual outcomes.

## 2021-03-25 ENCOUNTER — Other Ambulatory Visit: Payer: Self-pay

## 2021-03-26 ENCOUNTER — Ambulatory Visit (INDEPENDENT_AMBULATORY_CARE_PROVIDER_SITE_OTHER): Payer: BC Managed Care – PPO | Admitting: Internal Medicine

## 2021-03-26 ENCOUNTER — Encounter: Payer: Self-pay | Admitting: Internal Medicine

## 2021-03-26 VITALS — BP 110/80 | HR 95 | Temp 97.7°F | Ht 67.5 in | Wt 239.7 lb

## 2021-03-26 DIAGNOSIS — E538 Deficiency of other specified B group vitamins: Secondary | ICD-10-CM

## 2021-03-26 DIAGNOSIS — E559 Vitamin D deficiency, unspecified: Secondary | ICD-10-CM | POA: Diagnosis not present

## 2021-03-26 DIAGNOSIS — F900 Attention-deficit hyperactivity disorder, predominantly inattentive type: Secondary | ICD-10-CM | POA: Diagnosis not present

## 2021-03-26 DIAGNOSIS — Z0000 Encounter for general adult medical examination without abnormal findings: Secondary | ICD-10-CM

## 2021-03-26 DIAGNOSIS — E785 Hyperlipidemia, unspecified: Secondary | ICD-10-CM

## 2021-03-26 DIAGNOSIS — Z23 Encounter for immunization: Secondary | ICD-10-CM

## 2021-03-26 DIAGNOSIS — Z86010 Personal history of colonic polyps: Secondary | ICD-10-CM

## 2021-03-26 DIAGNOSIS — F320 Major depressive disorder, single episode, mild: Secondary | ICD-10-CM

## 2021-03-26 LAB — CBC WITH DIFFERENTIAL/PLATELET
Basophils Absolute: 0.1 10*3/uL (ref 0.0–0.1)
Basophils Relative: 0.6 % (ref 0.0–3.0)
Eosinophils Absolute: 0.3 10*3/uL (ref 0.0–0.7)
Eosinophils Relative: 3.4 % (ref 0.0–5.0)
HCT: 46.8 % (ref 39.0–52.0)
Hemoglobin: 15.7 g/dL (ref 13.0–17.0)
Lymphocytes Relative: 22.1 % (ref 12.0–46.0)
Lymphs Abs: 2.2 10*3/uL (ref 0.7–4.0)
MCHC: 33.6 g/dL (ref 30.0–36.0)
MCV: 87.3 fl (ref 78.0–100.0)
Monocytes Absolute: 0.7 10*3/uL (ref 0.1–1.0)
Monocytes Relative: 7.7 % (ref 3.0–12.0)
Neutro Abs: 6.5 10*3/uL (ref 1.4–7.7)
Neutrophils Relative %: 66.2 % (ref 43.0–77.0)
Platelets: 212 10*3/uL (ref 150.0–400.0)
RBC: 5.36 Mil/uL (ref 4.22–5.81)
RDW: 14.3 % (ref 11.5–15.5)
WBC: 9.8 10*3/uL (ref 4.0–10.5)

## 2021-03-26 LAB — LIPID PANEL
Cholesterol: 199 mg/dL (ref 0–200)
HDL: 39.2 mg/dL (ref >39.00)
LDL Cholesterol: 137 mg/dL — ABNORMAL HIGH (ref 0–99)
NonHDL: 159.8
Total CHOL/HDL Ratio: 5
Triglycerides: 112 mg/dL (ref 0.0–149.0)
VLDL: 22.4 mg/dL (ref 0.0–40.0)

## 2021-03-26 LAB — COMPREHENSIVE METABOLIC PANEL
ALT: 18 U/L (ref 0–53)
AST: 15 U/L (ref 0–37)
Albumin: 4.2 g/dL (ref 3.5–5.2)
Alkaline Phosphatase: 74 U/L (ref 39–117)
BUN: 18 mg/dL (ref 6–23)
CO2: 26 mEq/L (ref 19–32)
Calcium: 9.4 mg/dL (ref 8.4–10.5)
Chloride: 102 mEq/L (ref 96–112)
Creatinine, Ser: 1.32 mg/dL (ref 0.40–1.50)
GFR: 57.14 mL/min — ABNORMAL LOW (ref >60.00)
Glucose, Bld: 103 mg/dL — ABNORMAL HIGH (ref 70–99)
Potassium: 4.5 mEq/L (ref 3.5–5.1)
Sodium: 137 mEq/L (ref 135–145)
Total Bilirubin: 0.9 mg/dL (ref 0.2–1.2)
Total Protein: 7 g/dL (ref 6.0–8.3)

## 2021-03-26 LAB — HEMOGLOBIN A1C: Hgb A1c MFr Bld: 5.7 % (ref 4.6–6.5)

## 2021-03-26 LAB — TSH: TSH: 3.93 u[IU]/mL (ref 0.35–4.50)

## 2021-03-26 LAB — VITAMIN D 25 HYDROXY (VIT D DEFICIENCY, FRACTURES): VITD: 42.77 ng/mL (ref 30.00–100.00)

## 2021-03-26 LAB — VITAMIN B12: Vitamin B-12: 277 pg/mL (ref 211–911)

## 2021-03-26 MED ORDER — AMPHETAMINE-DEXTROAMPHETAMINE 10 MG PO TABS: 10.0000 mg | ORAL_TABLET | Freq: Every day | ORAL | 0 refills | Status: DC | PRN

## 2021-03-26 MED ORDER — AMPHETAMINE-DEXTROAMPHET ER 20 MG PO CP24: 20.0000 mg | ORAL_CAPSULE | Freq: Every day | ORAL | 0 refills | Status: DC

## 2021-03-26 MED ORDER — AMPHETAMINE-DEXTROAMPHET ER 20 MG PO CP24: 20.0000 mg | ORAL_CAPSULE | ORAL | 0 refills | Status: DC

## 2021-03-26 NOTE — Addendum Note (Signed)
Addended by: Westley Hummer B on: 03/26/2021 01:25 PM   Modules accepted: Orders

## 2021-03-26 NOTE — Patient Instructions (Addendum)
-Nice seeing you today!!  -Lab work today; will notify you once results are available.  -first shingles vaccine today.  -Schedule follow in 3 months for ADHD (could be virtual).   Preventive Care 37-64 Years Old, Male Preventive care refers to lifestyle choices and visits with your health care provider that can promote health and wellness. This includes:  A yearly physical exam. This is also called an annual wellness visit.  Regular dental and eye exams.  Immunizations.  Screening for certain conditions.  Healthy lifestyle choices, such as: ? Eating a healthy diet. ? Getting regular exercise. ? Not using drugs or products that contain nicotine and tobacco. ? Limiting alcohol use. What can I expect for my preventive care visit? Physical exam Your health care provider will check your:  Height and weight. These may be used to calculate your BMI (body mass index). BMI is a measurement that tells if you are at a healthy weight.  Heart rate and blood pressure.  Body temperature.  Skin for abnormal spots. Counseling Your health care provider may ask you questions about your:  Past medical problems.  Family's medical history.  Alcohol, tobacco, and drug use.  Emotional well-being.  Home life and relationship well-being.  Sexual activity.  Diet, exercise, and sleep habits.  Work and work Statistician.  Access to firearms. What immunizations do I need? Vaccines are usually given at various ages, according to a schedule. Your health care provider will recommend vaccines for you based on your age, medical history, and lifestyle or other factors, such as travel or where you work.   What tests do I need? Blood tests  Lipid and cholesterol levels. These may be checked every 5 years, or more often if you are over 36 years old.  Hepatitis C test.  Hepatitis B test. Screening  Lung cancer screening. You may have this screening every year starting at age 60 if you have  a 30-pack-year history of smoking and currently smoke or have quit within the past 15 years.  Prostate cancer screening. Recommendations will vary depending on your family history and other risks.  Genital exam to check for testicular cancer or hernias.  Colorectal cancer screening. ? All adults should have this screening starting at age 71 and continuing until age 30. ? Your health care provider may recommend screening at age 18 if you are at increased risk. ? You will have tests every 1-10 years, depending on your results and the type of screening test.  Diabetes screening. ? This is done by checking your blood sugar (glucose) after you have not eaten for a while (fasting). ? You may have this done every 1-3 years.  STD (sexually transmitted disease) testing, if you are at risk. Follow these instructions at home: Eating and drinking  Eat a diet that includes fresh fruits and vegetables, whole grains, lean protein, and low-fat dairy products.  Take vitamin and mineral supplements as recommended by your health care provider.  Do not drink alcohol if your health care provider tells you not to drink.  If you drink alcohol: ? Limit how much you have to 0-2 drinks a day. ? Be aware of how much alcohol is in your drink. In the [place] one drink equals one 12 oz bottle of beer (355 mL), one 5 oz glass of wine (148 mL), or one 1 oz glass of hard liquor (44 mL).   Lifestyle  Take daily care of your teeth and gums. Brush your teeth every morning and night  with fluoride toothpaste. Floss one time each day.  Stay active. Exercise for at least 30 minutes 5 or more days each week.  Do not use any products that contain nicotine or tobacco, such as cigarettes, e-cigarettes, and chewing tobacco. If you need help quitting, ask your health care provider.  Do not use drugs.  If you are sexually active, practice safe sex. Use a condom or other form of protection to prevent STIs (sexually transmitted  infections).  If told by your health care provider, take low-dose aspirin daily starting at age 67.  Find healthy ways to cope with stress, such as: ? Meditation, yoga, or listening to music. ? Journaling. ? Talking to a trusted person. ? Spending time with friends and family. Safety  Always wear your seat belt while driving or riding in a vehicle.  Do not drive: ? If you have been drinking alcohol. Do not ride with someone who has been drinking. ? When you are tired or distracted. ? While texting.  Wear a helmet and other protective equipment during sports activities.  If you have firearms in your house, make sure you follow all gun safety procedures. What's next?  Go to your health care provider once a year for an annual wellness visit.  Ask your health care provider how often you should have your eyes and teeth checked.  Stay up to date on all vaccines. This information is not intended to replace advice given to you by your health care provider. Make sure you discuss any questions you have with your health care provider. Document Revised: 09/13/2019 Document Reviewed: 12/09/2018 Elsevier Patient Education  2021 [employer].

## 2021-03-26 NOTE — Progress Notes (Signed)
Established Patient Office Visit     This visit occurred during the SARS-CoV-2 public health emergency.  Safety protocols were in place, including screening questions prior to the visit, additional usage of staff PPE, and extensive cleaning of exam room while observing appropriate contact time as indicated for disinfecting solutions.    CC/Reason for Visit: Annual preventive exam  HPI: Alan Tyler is a 65 y.o. male who is coming in today for the above mentioned reasons. Past Medical History is significant for: ADHD on Adderall, obesity, depression on Wellbutrin, vitamin D and vitamin B12 deficiency.  He has no acute complaints today.  He has routine eye and dental care.  He has been exercising by walking routinely.  His immunizations are up-to-date with the exception of shingles which she agrees to start today.  He had a colonoscopy in 2019.   Past Medical/Surgical History: Past Medical History:  Diagnosis Date  . Arthritis   . Cancer [facility]) 2003   kidney - 2003  . Chronic kidney disease    kidney stones and (R) nephrectomy  . Colon polyps   . Depression   . GERD (gastroesophageal reflux disease)   . Hyperlipidemia   . PVC's (premature ventricular contractions)    arrhythmia    Past Surgical History:  Procedure Laterality Date  . CATARACT EXTRACTION     right eye  . COLONOSCOPY  2014  . NEPHRECTOMY  2003   (R) for cancer tumor  . RETINAL DETACHMENT SURGERY  2012    Social History:  reports that he quit smoking about 19 years ago. He quit smokeless tobacco use about 18 years ago. He reports current alcohol use of about 4.0 standard drinks of alcohol per week. He reports that he does not use drugs.  Allergies: Allergies  Allergen Reactions  . Sulfa Drugs Cross Reactors     Per pt: unknown    Family History:  Family History  Problem Relation Age of Onset  . Arthritis Mother   . Depression Mother   . Colon cancer Mother 53       part of colon was removed   . Alcohol abuse Father   . Heart disease Father   . Hyperlipidemia Father   . Hyperlipidemia Maternal Grandmother   . Hyperlipidemia Maternal Grandfather   . Hyperlipidemia Paternal Grandmother   . Diabetes Paternal Grandmother   . Rectal cancer Paternal Grandmother   . Arthritis Paternal Grandfather   . Hyperlipidemia Paternal Grandfather   . Stomach cancer Neg Hx   . Esophageal cancer Neg Hx      Current Outpatient Medications:  .  B Complex-C (SUPER B COMPLEX PO), Take 1 tablet by mouth daily., Disp: , Rfl:  .  Biotin 10000 MCG TABS, Take 1 tablet by mouth daily., Disp: , Rfl:  .  buPROPion (WELLBUTRIN XL) 300 MG 24 hr tablet, TAKE 1 TABLET EVERY MORNING, Disp: 90 tablet, Rfl: 1 .  Cholecalciferol (VITAMIN D PO), Take 1 tablet by mouth daily., Disp: , Rfl:  .  Multiple Vitamin (ONE-A-DAY MENS PO), Take 1 tablet by mouth daily., Disp: , Rfl:  .  Omega-3 Fatty Acids (OMEGA 3 PO), Take by mouth. Take 3,300mg  (2 capsules) daily, Disp: , Rfl:  .  OVER THE COUNTER MEDICATION, NATURAL PSYLIUM HUSK FIBER 0.52 grams,(5Capsules) daily, Disp: , Rfl:  .  TURMERIC PO, Take 500 mg by mouth daily., Disp: , Rfl:  .  amphetamine-dextroamphetamine (ADDERALL XR) 20 MG 24 hr capsule, Take 1 capsule (20 mg  total) by mouth every morning., Disp: 30 capsule, Rfl: 0 .  amphetamine-dextroamphetamine (ADDERALL XR) 20 MG 24 hr capsule, Take 1 capsule (20 mg total) by mouth every morning., Disp: 30 capsule, Rfl: 0 .  amphetamine-dextroamphetamine (ADDERALL XR) 20 MG 24 hr capsule, Take 1 capsule (20 mg total) by mouth daily., Disp: 30 capsule, Rfl: 0 .  amphetamine-dextroamphetamine (ADDERALL) 10 MG tablet, Take 1 tablet (10 mg total) by mouth daily as needed., Disp: 30 tablet, Rfl: 0 .  amphetamine-dextroamphetamine (ADDERALL) 10 MG tablet, Take 1 tablet (10 mg total) by mouth daily as needed., Disp: 30 tablet, Rfl: 0 .  amphetamine-dextroamphetamine (ADDERALL) 10 MG tablet, Take 1 tablet (10 mg total) by  mouth daily as needed., Disp: 30 tablet, Rfl: 0  Current Facility-Administered Medications:  .  0.9 %  sodium chloride infusion, 500 mL, Intravenous, Once, Ladene Artist, MD  Review of Systems:  Constitutional: Denies fever, chills, diaphoresis, appetite change and fatigue.  HEENT: Denies photophobia, eye pain, redness, hearing loss, ear pain, congestion, sore throat, rhinorrhea, sneezing, mouth sores, trouble swallowing, neck pain, neck stiffness and tinnitus.   Respiratory: Denies SOB, DOE, cough, chest tightness,  and wheezing.   Cardiovascular: Denies chest pain, palpitations and leg swelling.  Gastrointestinal: Denies nausea, vomiting, abdominal pain, diarrhea, constipation, blood in stool and abdominal distention.  Genitourinary: Denies dysuria, urgency, frequency, hematuria, flank pain and difficulty urinating.  Endocrine: Denies: hot or cold intolerance, sweats, changes in hair or nails, polyuria, polydipsia. Musculoskeletal: Denies myalgias, back pain, joint swelling, arthralgias and gait problem.  Skin: Denies pallor, rash and wound.  Neurological: Denies dizziness, seizures, syncope, weakness, light-headedness, numbness and headaches.  Hematological: Denies adenopathy. Easy bruising, personal or family bleeding history  Psychiatric/Behavioral: Denies suicidal ideation, mood changes, confusion, nervousness, sleep disturbance and agitation    Physical Exam: Vitals:   03/26/21 0925  BP: 110/80  Pulse: 95  Temp: 97.7 F (36.5 C)  TempSrc: Oral  Weight: 239 lb 11.2 oz (108.7 kg)  Height: 5' 7.5" (1.715 m)    Body mass index is 36.99 kg/m.   Constitutional: NAD, calm, comfortable Eyes: PERRL, lids and conjunctivae normal, wears corrective lenses ENMT: Mucous membranes are moist. Posterior pharynx clear of any exudate or lesions. Normal dentition. Tympanic membrane is pearly white, no erythema or bulging. Neck: normal, supple, no masses, no thyromegaly Respiratory:  clear to auscultation bilaterally, no wheezing, no crackles. Normal respiratory effort. No accessory muscle use.  Cardiovascular: Regular rate and rhythm, no murmurs / rubs / gallops. No extremity edema. 2+ pedal pulses.   Abdomen: no tenderness, no masses palpated. No hepatosplenomegaly. Bowel sounds positive.  Musculoskeletal: no clubbing / cyanosis. No joint deformity upper and lower extremities. Good ROM, no contractures. Normal muscle tone.  Skin: no rashes, lesions, ulcers. No induration Neurologic: CN 2-12 grossly intact. Sensation intact, DTR normal. Strength 5/5 in all 4.  Psychiatric: Normal judgment and insight. Alert and oriented x 3. Normal mood.    Impression and Plan:  Encounter for preventive health examination  -He has routine eye and dental care. -For shingles vaccine today, all other immunizations are up-to-date including COVID x3. -Screening labs today. -Healthy lifestyle discussed in detail. -He had a colonoscopy in 2019 and is a 5-year callback.  Attention deficit hyperactivity disorder (ADHD), predominantly inattentive type -PDMP reviewed, no red flags, overdose risk score is 0. -Refill Adderall XR 20 mg to take in the morning with an additional 10 mg in the afternoon for 30 tablets a month of each x3  months.  Vitamin D deficiency  - Plan: VITAMIN D 25 Hydroxy (Vit-D Deficiency, Fractures)  Vitamin B12 deficiency -Check B12 level today.  Hyperlipidemia, unspecified hyperlipidemia type -Last LDL was 127 in March 2021, he is not on medication.  Current mild episode of major depressive disorder, unspecified whether recurrent (HCC) -Stable, PHQ-9 is 0 today, continue Wellbutrin.  Personal history of colonic polyps -Had a colonoscopy in 2019, 5-year callback.  Need for shingles vaccine -For shingles vaccine administered today.   Patient Instructions   -Nice seeing you today!!  -Lab work today; will notify you once results are available.  -first  shingles vaccine today.  -Schedule follow in 3 months for ADHD (could be virtual).   Preventive Care 59-47 Years Old, Male Preventive care refers to lifestyle choices and visits with your health care provider that can promote health and wellness. This includes:  A yearly physical exam. This is also called an annual wellness visit.  Regular dental and eye exams.  Immunizations.  Screening for certain conditions.  Healthy lifestyle choices, such as: ? Eating a healthy diet. ? Getting regular exercise. ? Not using drugs or products that contain nicotine and tobacco. ? Limiting alcohol use. What can I expect for my preventive care visit? Physical exam Your health care provider will check your:  Height and weight. These may be used to calculate your BMI (body mass index). BMI is a measurement that tells if you are at a healthy weight.  Heart rate and blood pressure.  Body temperature.  Skin for abnormal spots. Counseling Your health care provider may ask you questions about your:  Past medical problems.  Family's medical history.  Alcohol, tobacco, and drug use.  Emotional well-being.  Home life and relationship well-being.  Sexual activity.  Diet, exercise, and sleep habits.  Work and work Statistician.  Access to firearms. What immunizations do I need? Vaccines are usually given at various ages, according to a schedule. Your health care provider will recommend vaccines for you based on your age, medical history, and lifestyle or other factors, such as travel or where you work.   What tests do I need? Blood tests  Lipid and cholesterol levels. These may be checked every 5 years, or more often if you are over 52 years old.  Hepatitis C test.  Hepatitis B test. Screening  Lung cancer screening. You may have this screening every year starting at age 39 if you have a 30-pack-year history of smoking and currently smoke or have quit within the past 15  years.  Prostate cancer screening. Recommendations will vary depending on your family history and other risks.  Genital exam to check for testicular cancer or hernias.  Colorectal cancer screening. ? All adults should have this screening starting at age 68 and continuing until age 42. ? Your health care provider may recommend screening at age 56 if you are at increased risk. ? You will have tests every 1-10 years, depending on your results and the type of screening test.  Diabetes screening. ? This is done by checking your blood sugar (glucose) after you have not eaten for a while (fasting). ? You may have this done every 1-3 years.  STD (sexually transmitted disease) testing, if you are at risk. Follow these instructions at home: Eating and drinking  Eat a diet that includes fresh fruits and vegetables, whole grains, lean protein, and low-fat dairy products.  Take vitamin and mineral supplements as recommended by your health care provider.  Do not drink  alcohol if your health care provider tells you not to drink.  If you drink alcohol: ? Limit how much you have to 0-2 drinks a day. ? Be aware of how much alcohol is in your drink. In the [place] one drink equals one 12 oz bottle of beer (355 mL), one 5 oz glass of wine (148 mL), or one 1 oz glass of hard liquor (44 mL).   Lifestyle  Take daily care of your teeth and gums. Brush your teeth every morning and night with fluoride toothpaste. Floss one time each day.  Stay active. Exercise for at least 30 minutes 5 or more days each week.  Do not use any products that contain nicotine or tobacco, such as cigarettes, e-cigarettes, and chewing tobacco. If you need help quitting, ask your health care provider.  Do not use drugs.  If you are sexually active, practice safe sex. Use a condom or other form of protection to prevent STIs (sexually transmitted infections).  If told by your health care provider, take low-dose aspirin daily  starting at age 32.  Find healthy ways to cope with stress, such as: ? Meditation, yoga, or listening to music. ? Journaling. ? Talking to a trusted person. ? Spending time with friends and family. Safety  Always wear your seat belt while driving or riding in a vehicle.  Do not drive: ? If you have been drinking alcohol. Do not ride with someone who has been drinking. ? When you are tired or distracted. ? While texting.  Wear a helmet and other protective equipment during sports activities.  If you have firearms in your house, make sure you follow all gun safety procedures. What's next?  Go to your health care provider once a year for an annual wellness visit.  Ask your health care provider how often you should have your eyes and teeth checked.  Stay up to date on all vaccines. This information is not intended to replace advice given to you by your health care provider. Make sure you discuss any questions you have with your health care provider. Document Revised: 09/13/2019 Document Reviewed: 12/09/2018 Elsevier Patient Education  2021 [place], MD [facility]

## 2021-03-28 ENCOUNTER — Other Ambulatory Visit: Payer: Self-pay | Admitting: Internal Medicine

## 2021-03-28 ENCOUNTER — Encounter: Payer: Self-pay | Admitting: Internal Medicine

## 2021-03-28 DIAGNOSIS — E785 Hyperlipidemia, unspecified: Secondary | ICD-10-CM

## 2021-03-28 MED ORDER — ATORVASTATIN CALCIUM 10 MG PO TABS: 10.0000 mg | ORAL_TABLET | Freq: Every day | ORAL | 1 refills | Status: DC

## 2021-03-28 NOTE — Telephone Encounter (Signed)
MyChart appointment scheduled

## 2021-03-29 ENCOUNTER — Encounter: Payer: Self-pay | Admitting: Internal Medicine

## 2021-03-29 ENCOUNTER — Telehealth: Payer: BC Managed Care – PPO | Admitting: Internal Medicine

## 2021-03-29 VITALS — Wt 239.0 lb

## 2021-03-29 DIAGNOSIS — E785 Hyperlipidemia, unspecified: Secondary | ICD-10-CM

## 2021-03-29 DIAGNOSIS — Z905 Acquired absence of kidney: Secondary | ICD-10-CM | POA: Diagnosis not present

## 2021-03-29 NOTE — Progress Notes (Signed)
Virtual Visit via Video Note  I connected with Alan Tyler on 03/29/21 at  8:30 AM EDT by a video enabled telemedicine application and verified that I am speaking with the correct person using two identifiers.  Location patient: home Location provider: work office Persons participating in the virtual visit: patient, provider  I discussed the limitations of evaluation and management by telemedicine and the availability of in person appointments. The patient expressed understanding and agreed to proceed.   HPI: Alan Tyler has scheduled this visit to discuss the results of his lab work after his physical.  Alan Tyler was told that Alan Tyler was hyperlipidemic and my recommendation was to start low-dose statin.  Alan Tyler tells me Alan Tyler had tried Lipitor in the past and it had significant fatigue and myalgias and is hesitant to start.  Alan Tyler would like me to give him a trial of lifestyle changes as Alan Tyler has recently lost 30 pounds and feels good about losing more weight.  Alan Tyler also has concerns about his blood glucose level at 103 and his kidney function as Alan Tyler has had a nephrectomy due to renal cell cancer.  His creatinine has remained stable around the 1.3 range with a GFR around 57 for over 15 years.  His LDL has remained in the 120 5-1 35 range.   ROS: Constitutional: Denies fever, chills, diaphoresis, appetite change and fatigue.  HEENT: Denies photophobia, eye pain, redness, hearing loss, ear pain, congestion, sore throat, rhinorrhea, sneezing, mouth sores, trouble swallowing, neck pain, neck stiffness and tinnitus.   Respiratory: Denies SOB, DOE, cough, chest tightness,  and wheezing.   Cardiovascular: Denies chest pain, palpitations and leg swelling.  Gastrointestinal: Denies nausea, vomiting, abdominal pain, diarrhea, constipation, blood in stool and abdominal distention.  Genitourinary: Denies dysuria, urgency, frequency, hematuria, flank pain and difficulty urinating.  Endocrine: Denies: hot or cold intolerance, sweats,  changes in hair or nails, polyuria, polydipsia. Musculoskeletal: Denies myalgias, back pain, joint swelling, arthralgias and gait problem.  Skin: Denies pallor, rash and wound.  Neurological: Denies dizziness, seizures, syncope, weakness, light-headedness, numbness and headaches.  Hematological: Denies adenopathy. Easy bruising, personal or family bleeding history  Psychiatric/Behavioral: Denies suicidal ideation, mood changes, confusion, nervousness, sleep disturbance and agitation   Past Medical History:  Diagnosis Date  . Arthritis   . Cancer [facility]) 2003   kidney - 2003  . Chronic kidney disease    kidney stones and (R) nephrectomy  . Colon polyps   . Depression   . GERD (gastroesophageal reflux disease)   . Hyperlipidemia   . PVC's (premature ventricular contractions)    arrhythmia    Past Surgical History:  Procedure Laterality Date  . CATARACT EXTRACTION     right eye  . COLONOSCOPY  2014  . NEPHRECTOMY  2003   (R) for cancer tumor  . RETINAL DETACHMENT SURGERY  2012    Family History  Problem Relation Age of Onset  . Arthritis Mother   . Depression Mother   . Colon cancer Mother 54       part of colon was removed  . Alcohol abuse Father   . Heart disease Father   . Hyperlipidemia Father   . Hyperlipidemia Maternal Grandmother   . Hyperlipidemia Maternal Grandfather   . Hyperlipidemia Paternal Grandmother   . Diabetes Paternal Grandmother   . Rectal cancer Paternal Grandmother   . Arthritis Paternal Grandfather   . Hyperlipidemia Paternal Grandfather   . Stomach cancer Neg Hx   . Esophageal cancer Neg Hx  SOCIAL HX:   reports that Alan Tyler quit smoking about 19 years ago. Alan Tyler quit smokeless tobacco use about 18 years ago. Alan Tyler reports current alcohol use of about 4.0 standard drinks of alcohol per week. Alan Tyler reports that Alan Tyler does not use drugs.   Current Outpatient Medications:  .  amphetamine-dextroamphetamine (ADDERALL XR) 20 MG 24 hr capsule, Take 1 capsule  (20 mg total) by mouth every morning., Disp: 30 capsule, Rfl: 0 .  amphetamine-dextroamphetamine (ADDERALL XR) 20 MG 24 hr capsule, Take 1 capsule (20 mg total) by mouth every morning., Disp: 30 capsule, Rfl: 0 .  amphetamine-dextroamphetamine (ADDERALL XR) 20 MG 24 hr capsule, Take 1 capsule (20 mg total) by mouth daily., Disp: 30 capsule, Rfl: 0 .  amphetamine-dextroamphetamine (ADDERALL) 10 MG tablet, Take 1 tablet (10 mg total) by mouth daily as needed., Disp: 30 tablet, Rfl: 0 .  amphetamine-dextroamphetamine (ADDERALL) 10 MG tablet, Take 1 tablet (10 mg total) by mouth daily as needed., Disp: 30 tablet, Rfl: 0 .  amphetamine-dextroamphetamine (ADDERALL) 10 MG tablet, Take 1 tablet (10 mg total) by mouth daily as needed., Disp: 30 tablet, Rfl: 0 .  atorvastatin (LIPITOR) 10 MG tablet, Take 1 tablet (10 mg total) by mouth daily., Disp: 90 tablet, Rfl: 1 .  B Complex-C (SUPER B COMPLEX PO), Take 1 tablet by mouth daily., Disp: , Rfl:  .  Biotin 10000 MCG TABS, Take 1 tablet by mouth daily., Disp: , Rfl:  .  buPROPion (WELLBUTRIN XL) 300 MG 24 hr tablet, TAKE 1 TABLET EVERY MORNING, Disp: 90 tablet, Rfl: 1 .  Cholecalciferol (VITAMIN D PO), Take 1 tablet by mouth daily., Disp: , Rfl:  .  Multiple Vitamin (ONE-A-DAY MENS PO), Take 1 tablet by mouth daily., Disp: , Rfl:  .  Omega-3 Fatty Acids (OMEGA 3 PO), Take by mouth. Take 3,300mg  (2 capsules) daily, Disp: , Rfl:  .  OVER THE COUNTER MEDICATION, NATURAL PSYLIUM HUSK FIBER 0.52 grams,(5Capsules) daily, Disp: , Rfl:  .  TURMERIC PO, Take 500 mg by mouth daily., Disp: , Rfl:   Current Facility-Administered Medications:  .  0.9 %  sodium chloride infusion, 500 mL, Intravenous, Once, Ladene Artist, MD  EXAM:   VITALS per patient if applicable: None reported  GENERAL: alert, oriented, appears well and in no acute distress  HEENT: atraumatic, conjunttiva clear, no obvious abnormalities on inspection of external nose and ears  NECK: normal  movements of the head and neck  LUNGS: on inspection no signs of respiratory distress, breathing rate appears normal, no obvious gross increased work of breathing, gasping or wheezing  CV: no obvious cyanosis  MS: moves all visible extremities without noticeable abnormality  PSYCH/NEURO: pleasant and cooperative, no obvious depression or anxiety, speech and thought processing grossly intact  ASSESSMENT AND PLAN:   Hyperlipidemia, unspecified hyperlipidemia type History of nephrectomy  -I am okay about doing another 77-month period of lifestyle changes before initiating statin. -If we do decide to initiate statin at that point, can try an alternative medication to atorvastatin and could potentially even do a 3-day a week dosing schedule. -Alan Tyler has stage II chronic kidney disease and that has been stable for years.  Observation only for now. -Fasting glucose at 103 is within limit of normal.  We will continue to observe and monitor.     I discussed the assessment and treatment plan with the patient. The patient was provided an opportunity to ask questions and all were answered. The patient agreed with the plan and  demonstrated an understanding of the instructions.   The patient was advised to call back or seek an in-person evaluation if the symptoms worsen or if the condition fails to improve as anticipated.    Lelon Frohlich, MD  [facility]

## 2021-04-15 ENCOUNTER — Encounter: Payer: Self-pay | Admitting: Internal Medicine

## 2021-04-15 DIAGNOSIS — Z85528 Personal history of other malignant neoplasm of kidney: Secondary | ICD-10-CM

## 2021-04-15 DIAGNOSIS — Z905 Acquired absence of kidney: Secondary | ICD-10-CM

## 2021-04-15 DIAGNOSIS — R899 Unspecified abnormal finding in specimens from other organs, systems and tissues: Secondary | ICD-10-CM

## 2021-04-16 ENCOUNTER — Encounter: Payer: Self-pay | Admitting: Internal Medicine

## 2021-05-02 ENCOUNTER — Emergency Department (HOSPITAL_COMMUNITY): Payer: BC Managed Care – PPO

## 2021-05-02 ENCOUNTER — Other Ambulatory Visit: Payer: Self-pay

## 2021-05-02 ENCOUNTER — Emergency Department (HOSPITAL_COMMUNITY)
Admission: EM | Admit: 2021-05-02 | Discharge: 2021-05-03 | Disposition: A | Discharge status: Home / Self Care | Payer: BC Managed Care – PPO | Attending: Emergency Medicine | Admitting: Emergency Medicine

## 2021-05-02 ENCOUNTER — Encounter (HOSPITAL_COMMUNITY): Payer: Self-pay

## 2021-05-02 DIAGNOSIS — Z85528 Personal history of other malignant neoplasm of kidney: Secondary | ICD-10-CM | POA: Diagnosis not present

## 2021-05-02 DIAGNOSIS — R0789 Other chest pain: Secondary | ICD-10-CM | POA: Diagnosis not present

## 2021-05-02 DIAGNOSIS — Z87891 Personal history of nicotine dependence: Secondary | ICD-10-CM | POA: Insufficient documentation

## 2021-05-02 DIAGNOSIS — R079 Chest pain, unspecified: Secondary | ICD-10-CM | POA: Insufficient documentation

## 2021-05-02 DIAGNOSIS — N189 Chronic kidney disease, unspecified: Secondary | ICD-10-CM | POA: Insufficient documentation

## 2021-05-02 NOTE — ED Provider Notes (Signed)
MSE was initiated and I personally evaluated the patient and placed orders (if any) at  11:46 PM on May 02, 2021.  Patient here with left-sided chest pain that began about an hour ago.  Pain radiates to left arm and neck.  Reports associated shortness of breath.  Denies any treatment prior to arrival.  Alert and oriented Normal heart rate Lung sounds are clear     Discussed with patient that their care has been initiated.   They are counseled that they will need to remain in the ED until the completion of their workup, including full H&P and results of any tests.  Risks of leaving the emergency department prior to completion of treatment were discussed. Patient was advised to inform ED staff if they are leaving before their treatment is complete. The patient acknowledged these risks and time was allowed for questions.    The patient appears stable so that the remainder of the MSE may be completed by another provider.    Montine Circle, PA-C 05/02/21 [id number]    Margette Fast, MD 05/03/21 [phone]

## 2021-05-02 NOTE — ED Notes (Signed)
Patient transported to X-ray 

## 2021-05-02 NOTE — ED Triage Notes (Signed)
Left sided chest pain radiating down to left arm with neck pain, dizziness and nausea. Started about an hour ago while at work.

## 2021-05-03 LAB — BASIC METABOLIC PANEL
Anion gap: 9 (ref 5–15)
BUN: 13 mg/dL (ref 8–23)
CO2: 25 mmol/L (ref 22–32)
Calcium: 9.2 mg/dL (ref 8.9–10.3)
Chloride: 104 mmol/L (ref 98–111)
Creatinine, Ser: 1.48 mg/dL — ABNORMAL HIGH (ref 0.61–1.24)
GFR, Estimated: 53 mL/min — ABNORMAL LOW (ref >60)
Glucose, Bld: 87 mg/dL (ref 70–99)
Potassium: 4.3 mmol/L (ref 3.5–5.1)
Sodium: 138 mmol/L (ref 135–145)

## 2021-05-03 LAB — TROPONIN I (HIGH SENSITIVITY)
Troponin I (High Sensitivity): 3 ng/L (ref <18)
Troponin I (High Sensitivity): 4 ng/L (ref <18)

## 2021-05-03 LAB — CBC
HCT: 46.7 % (ref 39.0–52.0)
Hemoglobin: 15.1 g/dL (ref 13.0–17.0)
MCH: 29.3 pg (ref 26.0–34.0)
MCHC: 32.3 g/dL (ref 30.0–36.0)
MCV: 90.5 fL (ref 80.0–100.0)
Platelets: 234 10*3/uL (ref 150–400)
RBC: 5.16 MIL/uL (ref 4.22–5.81)
RDW: 13.1 % (ref 11.5–15.5)
WBC: 8.5 10*3/uL (ref 4.0–10.5)
nRBC: 0 % (ref 0.0–0.2)

## 2021-05-07 NOTE — Progress Notes (Deleted)
Cardiology Office Note:    Date:  05/07/2021   ID:  Cleland Simkins, DOB July 26, 1956, MRN [id number]  PCP:  Isaac Bliss, Rayford Halsted, MD   [facility] Providers Cardiologist:  None {   Referring MD: Merrily Pew, MD     History of Present Illness:    Alan Tyler is a 65 y.o. male with a hx of ***  Past Medical History:  Diagnosis Date  . Arthritis   . Cancer [facility]) 2003   kidney - 2003  . Chronic kidney disease    kidney stones and (R) nephrectomy  . Colon polyps   . Depression   . GERD (gastroesophageal reflux disease)   . Hyperlipidemia   . PVC's (premature ventricular contractions)    arrhythmia    Past Surgical History:  Procedure Laterality Date  . CATARACT EXTRACTION     right eye  . COLONOSCOPY  2014  . NEPHRECTOMY  2003   (R) for cancer tumor  . RETINAL DETACHMENT SURGERY  2012    Current Medications: No outpatient medications have been marked as taking for the 05/09/21 encounter (Appointment) with Freada Bergeron, MD.   Current Facility-Administered Medications for the 05/09/21 encounter (Appointment) with Freada Bergeron, MD  Medication  . 0.9 %  sodium chloride infusion     Allergies:   Sulfa drugs cross reactors   Social History   Socioeconomic History  . Marital status: Married    Spouse name: Not on file  . Number of children: 3  . Years of education: Not on file  . Highest education level: Not on file  Occupational History  . 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 Smoking status: Former Smoker    Quit date: 12/29/2001    Years since quitting: 19.3  . Smokeless tobacco: Former Systems developer    Quit date: 12/27/2002  Vaping Use  . Vaping Use: Never used  Substance and Sexual Activity  . Alcohol use: Yes    Alcohol/week: 4.0 standard drinks    Types: 4 Standard drinks or equivalent per week  . Drug use: No  . Sexual activity: Not on file  Other Topics Concern  . Not on file  Social  History Narrative  . Not on file   Social Determinants of Health   Financial Resource Strain: Not on file  Food Insecurity: Not on file  Transportation Needs: Not on file  Physical Activity: Not on file  Stress: Not on file  Social Connections: Not on file     Family History: The patient's ***family history includes Alcohol abuse in his father; Arthritis in his mother and paternal grandfather; Colon cancer (age of onset: 6) in his mother; Depression in his mother; Diabetes in his paternal grandmother; Heart disease in his father; Hyperlipidemia in his father, maternal grandfather, maternal grandmother, paternal grandfather, and paternal grandmother; Rectal cancer in his paternal grandmother. There is no history of Stomach cancer or Esophageal cancer.  ROS:   Please see the history of present illness.    *** All other systems reviewed and are negative.  EKGs/Labs/Other Studies Reviewed:    The following studies were reviewed today: ***  EKG:  EKG is *** ordered today.  The ekg ordered today demonstrates ***  Recent Labs: 03/26/2021: ALT 18; TSH 3.93 05/02/2021: BUN 13; Creatinine, Ser 1.48; Hemoglobin 15.1; Platelets 234; Potassium 4.3; Sodium 138  Recent Lipid Panel    Component Value Date/Time   CHOL 199 03/26/2021 0951   TRIG  112.0 03/26/2021 0951   HDL 39.20 03/26/2021 0951   CHOLHDL 5 03/26/2021 0951   VLDL 22.4 03/26/2021 0951   LDLCALC 137 (H) 03/26/2021 0951   LDLDIRECT 163.2 11/29/2012 0805     Risk Assessment/Calculations:   {Does this patient have ATRIAL FIBRILLATION?:[phone]}   Physical Exam:    VS:  There were no vitals taken for this visit.    Wt Readings from Last 3 Encounters:  05/02/21 238 lb 1.6 oz (108 kg)  03/29/21 239 lb (108.4 kg)  03/26/21 239 lb 11.2 oz (108.7 kg)     GEN: *** Well nourished, well developed in no acute distress HEENT: Normal NECK: No JVD; No carotid bruits LYMPHATICS: No lymphadenopathy CARDIAC: ***RRR, no murmurs,  rubs, gallops RESPIRATORY:  Clear to auscultation without rales, wheezing or rhonchi  ABDOMEN: Soft, non-tender, non-distended MUSCULOSKELETAL:  No edema; No deformity  SKIN: Warm and dry NEUROLOGIC:  Alert and oriented x 3 PSYCHIATRIC:  Normal affect   ASSESSMENT:    No diagnosis found. PLAN:    In order of problems listed above:  1. ***   {Are you ordering a CV Procedure (e.g. stress test, cath, DCCV, TEE, etc)?   Press F2        :[id number]}    Medication Adjustments/Labs and Tests Ordered: Current medicines are reviewed at length with the patient today.  Concerns regarding medicines are outlined above.  No orders of the defined types were placed in this encounter.  No orders of the defined types were placed in this encounter.   There are no Patient Instructions on file for this visit.   Signed, Freada Bergeron, MD  05/07/2021 9:03 PM    [place]

## 2021-05-09 ENCOUNTER — Other Ambulatory Visit: Payer: Self-pay

## 2021-05-09 ENCOUNTER — Ambulatory Visit (INDEPENDENT_AMBULATORY_CARE_PROVIDER_SITE_OTHER): Payer: BC Managed Care – PPO | Admitting: Internal Medicine

## 2021-05-09 ENCOUNTER — Ambulatory Visit: Payer: BC Managed Care – PPO | Admitting: Cardiology

## 2021-05-09 ENCOUNTER — Encounter: Payer: Self-pay | Admitting: Internal Medicine

## 2021-05-09 VITALS — BP 126/80 | HR 92 | Ht 67.0 in | Wt 232.2 lb

## 2021-05-09 DIAGNOSIS — R072 Precordial pain: Secondary | ICD-10-CM | POA: Diagnosis not present

## 2021-05-09 DIAGNOSIS — Z8249 Family history of ischemic heart disease and other diseases of the circulatory system: Secondary | ICD-10-CM

## 2021-05-09 DIAGNOSIS — E782 Mixed hyperlipidemia: Secondary | ICD-10-CM

## 2021-05-09 DIAGNOSIS — R0789 Other chest pain: Secondary | ICD-10-CM

## 2021-05-09 NOTE — Progress Notes (Signed)
OFFICE CONSULT NOTE  Chief Complaint:  Chest pain  Primary Care Physician: Isaac Bliss, Rayford Halsted, MD  HPI:  Alan Tyler is a 65 y.o. male who is being seen today for the evaluation of chest pain at the request of Mesner, Corene Cornea, MD. This is a very pleasant 65 year old male who works at the [employer].  He has a history of chest pain in the past been evaluated once in 2009 with stress testing in [place] and he previously saw Dr. Peter Martinique in 2014.  At the time he was having some PVCs which were noted when he donated blood.  He underwent an exercise stress echocardiogram which was negative for ischemia.  He had also had some atypical sounding chest pain.  He does have heart disease in his family coding his father who had premature onset coronary disease.  Recently he was working and noticed he had some left-sided chest pain as well as numbness and tingling in his left arm which was a new finding.  I lasted for 15 to 20 minutes.  Subsequently he was seen in the emergency department.  EKG showed no ischemic changes.  He had negative troponins.  He was asked to follow-up with cardiology for further testing.  He denies any recurrent anginal symptoms since then.  He does have some degree of dyslipidemia with total cholesterol in March at 199, HDL 39, LDL 137 and triglycerides 112.  This was addressed with his primary care provider and the plan was to work on diet and lifestyle modification and repeat his lipids this summer.  No history of hypertension or diabetes and is not currently on any medications other than some bupropion and Adderall.  PMHx:  Past Medical History:  Diagnosis Date  . Arthritis   . Cancer [facility]) 2003   kidney - 2003  . Chronic kidney disease    kidney stones and (R) nephrectomy  . Colon polyps   . Depression   . GERD (gastroesophageal reflux disease)   . Hyperlipidemia   . PVC's (premature ventricular contractions)    arrhythmia    Past  Surgical History:  Procedure Laterality Date  . CATARACT EXTRACTION     right eye  . COLONOSCOPY  2014  . NEPHRECTOMY  2003   (R) for cancer tumor  . RETINAL DETACHMENT SURGERY  2012    FAMHx:  Family History  Problem Relation Age of Onset  . Arthritis Mother   . Depression Mother   . Colon cancer Mother 17       part of colon was removed  . Alcohol abuse Father   . Heart disease Father   . Hyperlipidemia Father   . Hyperlipidemia Maternal Grandmother   . Hyperlipidemia Maternal Grandfather   . Hyperlipidemia Paternal Grandmother   . Diabetes Paternal Grandmother   . Rectal cancer Paternal Grandmother   . Arthritis Paternal Grandfather   . Hyperlipidemia Paternal Grandfather   . Stomach cancer Neg Hx   . Esophageal cancer Neg Hx     SOCHx:   reports that he quit smoking about 19 years ago. He quit smokeless tobacco use about 18 years ago. He reports current alcohol use of about 4.0 standard drinks of alcohol per week. He reports that he does not use drugs.  ALLERGIES:  Allergies  Allergen Reactions  . Sulfa Drugs Cross Reactors     Per pt: unknown    ROS: Pertinent items noted in HPI and remainder of comprehensive ROS otherwise negative.  HOME  MEDS: Current Outpatient Medications on File Prior to Visit  Medication Sig Dispense Refill  . amphetamine-dextroamphetamine (ADDERALL XR) 20 MG 24 hr capsule Take 1 capsule (20 mg total) by mouth every morning. 30 capsule 0  . amphetamine-dextroamphetamine (ADDERALL) 10 MG tablet Take 1 tablet (10 mg total) by mouth daily as needed. 30 tablet 0  . amphetamine-dextroamphetamine (ADDERALL) 10 MG tablet Take 1 tablet (10 mg total) by mouth daily as needed. 30 tablet 0  . buPROPion (WELLBUTRIN XL) 300 MG 24 hr tablet TAKE 1 TABLET EVERY MORNING 90 tablet 1  . Multiple Vitamin (ONE-A-DAY MENS PO) Take 1 tablet by mouth daily.    Marland Kitchen OVER THE COUNTER MEDICATION NATURAL PSYLIUM HUSK FIBER 0.52 grams,(5Capsules) daily     Current  Facility-Administered Medications on File Prior to Visit  Medication Dose Route Frequency Provider Last Rate Last Admin  . 0.9 %  sodium chloride infusion  500 mL Intravenous Once Ladene Artist, MD        LABS/IMAGING: No results found for this or any previous visit (from the past 48 hour(s)). No results found.  LIPID PANEL:    Component Value Date/Time   CHOL 199 03/26/2021 0951   TRIG 112.0 03/26/2021 0951   HDL 39.20 03/26/2021 0951   CHOLHDL 5 03/26/2021 0951   VLDL 22.4 03/26/2021 0951   LDLCALC 137 (H) 03/26/2021 0951   LDLDIRECT 163.2 11/29/2012 0805    WEIGHTS: Wt Readings from Last 3 Encounters:  05/09/21 232 lb 3.2 oz (105.3 kg)  05/02/21 238 lb 1.6 oz (108 kg)  03/29/21 239 lb (108.4 kg)    VITALS: BP 126/80   Pulse 92   Ht 5\' 7"  (1.702 m)   Wt 232 lb 3.2 oz (105.3 kg)   SpO2 97%   BMI 36.37 kg/m   EXAM: General appearance: alert and no distress Neck: no carotid bruit, no JVD and thyroid not enlarged, symmetric, no tenderness/mass/nodules Lungs: clear to auscultation bilaterally Heart: regular rate and rhythm Abdomen: soft, non-tender; bowel sounds normal; no masses,  no organomegaly Extremities: extremities normal, atraumatic, no cyanosis or edema Pulses: 2+ and symmetric Skin: Skin color, texture, turgor normal. No rashes or lesions Neurologic: Grossly normal Psych: Pleasant  EKG: Deferred  ASSESSMENT: 1. Atypical chest pain 2. Strong family history of premature coronary disease 3. Mixed dyslipidemia  PLAN: 1.   Mr. [name] has description of atypical chest pain.  He ruled out for MI and had a normal EKG.  He does have a strong family history of early onset heart disease.  He has had negative stress testing back in 2009 in 2014.  He is interested in starting a more aggressive exercise program.  Recommend an exercise Myoview stress test to rule out any significant ischemia.  In addition, I recommended coronary calcium scoring to look for any  premature atherosclerosis.  He is not currently on any therapies and is trying to work with diet and exercise.  If he is however found to have a very high calcium score, then there would be an argument for statin therapy.  Plan follow-up with me afterwards.  Pixie Casino, MD, [facility], [place] Director of the Advanced Lipid Disorders &  Cardiovascular Risk Reduction Clinic Diplomate of the American Board of Clinical Lipidology Attending Cardiologist  Direct Dial: [phone]  Fax: [phone]  Website:  [email or web address] 05/09/2021, 2:13 PM

## 2021-05-09 NOTE — Patient Instructions (Signed)
Medication Instructions:  Continue current medications  *If you need a refill on your cardiac medications before your next appointment, please call your pharmacy*   Lab Work: None Ordered   Testing/Procedures: Your physician has requested that you have a Coronary Calcium Score. This test is done at our [facility]  Your physician has requested that you have en exercise stress myoview. For further information please visit [email or web address]. Please follow instruction sheet, as given.   Follow-Up: At [facility], you and your health needs are our priority.  As part of our continuing mission to provide you with exceptional heart care, we have created designated Provider Care Teams.  These Care Teams include your primary Cardiologist (physician) and Advanced Practice Providers (APPs -  Physician Assistants and Nurse Practitioners) who all work together to provide you with the care you need, when you need it.  We recommend signing up for the patient portal called "MyChart".  Sign up information is provided on this After Visit Summary.  MyChart is used to connect with patients for Virtual Visits (Telemedicine).  Patients are able to view lab/test results, encounter notes, upcoming appointments, etc.  Non-urgent messages can be sent to your provider as well.   To learn more about what you can do with MyChart, go to [email or web address].    Your next appointment:   2 Month  The format for your next appointment:   In Person  Provider:   You may see Lyman Bishop, MD or one of the following Advanced Practice Providers on your designated Care Team:    Almyra Deforest, PA-C  Fabian Sharp, PA-C or   Roby Lofts, [place]

## 2021-05-13 ENCOUNTER — Other Ambulatory Visit: Payer: Self-pay

## 2021-05-13 ENCOUNTER — Ambulatory Visit (INDEPENDENT_AMBULATORY_CARE_PROVIDER_SITE_OTHER)
Admission: RE | Admit: 2021-05-13 | Discharge: 2021-05-13 | Disposition: A | Discharge status: Home / Self Care | Payer: Self-pay | Source: Ambulatory Visit | Attending: Internal Medicine | Admitting: Internal Medicine

## 2021-05-13 DIAGNOSIS — Z8249 Family history of ischemic heart disease and other diseases of the circulatory system: Secondary | ICD-10-CM

## 2021-05-13 DIAGNOSIS — R0789 Other chest pain: Secondary | ICD-10-CM

## 2021-05-13 NOTE — ED Provider Notes (Signed)
[facility] EMERGENCY DEPARTMENT Provider Note   CSN: [id number] Arrival date & time: 05/02/21  2340     History Chief Complaint  Patient presents with  . Chest Pain    Alan Tyler is a 65 y.o. male.   Chest Pain Pain location:  L chest Pain quality: aching   Pain quality: no pressure   Pain radiates to:  Does not radiate Pain severity:  Mild Onset quality:  Gradual Timing:  Constant Chronicity:  New Context: not breathing, not raising an arm, not stress and not trauma   Relieved by:  None tried Worsened by:  Nothing      Past Medical History:  Diagnosis Date  . Arthritis   . Cancer [facility]) 2003   kidney - 2003  . Chronic kidney disease    kidney stones and (R) nephrectomy  . Colon polyps   . Depression   . GERD (gastroesophageal reflux disease)   . Hyperlipidemia   . PVC's (premature ventricular contractions)    arrhythmia    Patient Active Problem List   Diagnosis Date Noted  . Cystoid macular edema of left eye 07/09/2020  . Aphakia of eye, left 07/09/2020  . History of retinal detachment 07/09/2020  . Vitamin D deficiency 03/23/2020  . Vitamin B12 deficiency 03/23/2020  . Hyperlipidemia 03/23/2020  . Morbid obesity ([place]) 12/03/2018  . ADD (attention deficit disorder) 04/28/2014  . Personal history of colonic polyps 03/21/2014  . PVC (premature ventricular contraction) 01/26/2013  . Chest pain, atypical 01/26/2013  . Heart palpitations 01/18/2013  . Abnormal EKG 01/18/2013  . History of nephrectomy 01/18/2013  . History of kidney cancer 01/18/2013  . Depression 06/07/2012    Past Surgical History:  Procedure Laterality Date  . CATARACT EXTRACTION     right eye  . COLONOSCOPY  2014  . NEPHRECTOMY  2003   (R) for cancer tumor  . RETINAL DETACHMENT SURGERY  2012       Family History  Problem Relation Age of Onset  . Arthritis Mother   . Depression Mother   . Colon cancer Mother 62       part of colon was removed  .  Alcohol abuse Father   . Heart disease Father   . Hyperlipidemia Father   . Hyperlipidemia Maternal Grandmother   . Hyperlipidemia Maternal Grandfather   . Hyperlipidemia Paternal Grandmother   . Diabetes Paternal Grandmother   . Rectal cancer Paternal Grandmother   . Arthritis Paternal Grandfather   . Hyperlipidemia Paternal Grandfather   . Stomach cancer Neg Hx   . Esophageal cancer Neg Hx     Social History   Tobacco Use  . Smoking status: Former Smoker    Quit date: 12/29/2001    Years since quitting: 19.3  . Smokeless tobacco: Former Systems developer    Quit date: 12/27/2002  Vaping Use  . Vaping Use: Never used  Substance Use Topics  . Alcohol use: Yes    Alcohol/week: 4.0 standard drinks    Types: 4 Standard drinks or equivalent per week  . Drug use: No    Home Medications Prior to Admission medications   Medication Sig Start Date End Date Taking? Authorizing Provider  amphetamine-dextroamphetamine (ADDERALL XR) 20 MG 24 hr capsule Take 1 capsule (20 mg total) by mouth every morning. 03/26/21   Isaac Bliss, Rayford Halsted, MD  amphetamine-dextroamphetamine (ADDERALL) 10 MG tablet Take 1 tablet (10 mg total) by mouth daily as needed. 03/26/21   Isaac Bliss, Rayford Halsted, MD  amphetamine-dextroamphetamine (ADDERALL) 10 MG tablet Take 1 tablet (10 mg total) by mouth daily as needed. 03/26/21   Isaac Bliss, Rayford Halsted, MD  buPROPion (WELLBUTRIN XL) 300 MG 24 hr tablet TAKE 1 TABLET EVERY MORNING 01/08/21   Isaac Bliss, Rayford Halsted, MD  Multiple Vitamin (ONE-A-DAY MENS PO) Take 1 tablet by mouth daily.    [provider]  OVER THE COUNTER MEDICATION NATURAL PSYLIUM HUSK FIBER 0.52 grams,(5Capsules) daily    [provider]    Allergies    Sulfa drugs cross reactors  Review of Systems   Review of Systems  Cardiovascular: Positive for chest pain.  All other systems reviewed and are negative.   Physical Exam Updated Vital Signs BP 136/80   Pulse 70   Temp  98.7 F (37.1 C) (Oral)   Resp 18   Ht 5\' 7"  (1.702 m)   Wt 108 kg   SpO2 97%   BMI 37.29 kg/m   Physical Exam Vitals and nursing note reviewed.  Constitutional:      Appearance: He is well-developed.  HENT:     Head: Normocephalic and atraumatic.     Nose: No congestion or rhinorrhea.     Mouth/Throat:     Mouth: Mucous membranes are moist.     Pharynx: Oropharynx is clear.  Eyes:     Pupils: Pupils are equal, round, and reactive to light.  Cardiovascular:     Rate and Rhythm: Normal rate.  Pulmonary:     Effort: Pulmonary effort is normal. No respiratory distress.  Abdominal:     General: There is no distension.     Palpations: Abdomen is soft.  Musculoskeletal:        General: Normal range of motion.     Cervical back: Normal range of motion.  Skin:    General: Skin is warm and dry.  Neurological:     General: No focal deficit present.     Mental Status: He is alert.  Psychiatric:        Mood and Affect: Mood normal.     ED Results / Procedures / Treatments   Labs (all labs ordered are listed, but only abnormal results are displayed) Labs Reviewed  BASIC METABOLIC PANEL - Abnormal; Notable for the following components:      Result Value   Creatinine, Ser 1.48 (*)    GFR, Estimated 53 (*)    All other components within normal limits  CBC  TROPONIN I (HIGH SENSITIVITY)  TROPONIN I (HIGH SENSITIVITY)    EKG EKG Interpretation  Date/Time:  Thursday May 02 2021 23:44:32 EDT Ventricular Rate:  92 PR Interval:  144 QRS Duration: 96 QT Interval:  368 QTC Calculation: 455 R Axis:   10 Text Interpretation: Normal sinus rhythm Normal ECG Confirmed by Nanda Quinton [phone]) on 05/03/2021 12:30:39 AM   Radiology CT CARDIAC SCORING (SELF PAY ONLY)  Addendum Date: 05/13/2021   ADDENDUM REPORT: 05/13/2021 16:07 CLINICAL DATA:  Cardiovascular Disease Risk stratification EXAM: Coronary Calcium Score TECHNIQUE: A gated, non-contrast computed tomography scan of the  heart was performed using 49mm slice thickness. Axial images were analyzed on a dedicated workstation. Calcium scoring of the coronary arteries was performed using the Agatston method. FINDINGS: Coronary arteries: Normal origins. Coronary Calcium Score: Left main: 29 Left anterior descending artery: 69 Left circumflex artery: 15 Right coronary artery: 10 Total: 123 Percentile: 61 Pericardium: Normal. Ascending Aorta: Dilated ascending aorta measuring 85mm Non-cardiac: See separate report from [facility]. IMPRESSION: 1. Coronary calcium score  of 123. This was 61st percentile for age-, race-, and sex-matched controls. 2.  Dilated ascending aorta measuring 8mm RECOMMENDATIONS: Coronary artery calcium (CAC) score is a strong predictor of incident coronary heart disease (CHD) and provides predictive information beyond traditional risk factors. CAC scoring is reasonable to use in the decision to withhold, postpone, or initiate statin therapy in intermediate-risk or selected borderline-risk asymptomatic adults (age 2-75 years and LDL-C >=70 to <190 mg/dL) who do not have diabetes or established atherosclerotic cardiovascular disease (ASCVD).* In intermediate-risk (10-year ASCVD risk >=7.5% to <20%) adults or selected borderline-risk (10-year ASCVD risk >=5% to <7.5%) adults in whom a CAC score is measured for the purpose of making a treatment decision the following recommendations have been made: If CAC=0, it is reasonable to withhold statin therapy and reassess in 5 to 10 years, as long as higher risk conditions are absent (diabetes mellitus, family history of premature CHD in first degree relatives (males <55 years; females <65 years), cigarette smoking, or LDL >=190 mg/dL). If CAC is 1 to 99, it is reasonable to initiate statin therapy for patients >=93 years of age. If CAC is >=100 or >=75th percentile, it is reasonable to initiate statin therapy at any age. Cardiology referral should be considered for  patients with CAC scores >=400 or >=75th percentile. *2018 AHA/ACC/AACVPR/AAPA/ABC/ACPM/ADA/AGS/APhA/ASPC/NLA/PCNA Guideline on the Management of Blood Cholesterol: A Report of the American College of Cardiology/American Heart Association Task Force on Clinical Practice Guidelines. J Am Coll Cardiol. 2019;73(24):3168-3209. Oswaldo Milian, MD Electronically Signed   By: Oswaldo Milian MD   On: 05/13/2021 16:07   Result Date: 05/13/2021 EXAM: OVER-READ INTERPRETATION  CT CHEST The following report is an over-read performed by radiologist Dr. Vinnie Langton of [facility], [place] on 05/13/2021. This over-read does not include interpretation of cardiac or coronary anatomy or pathology. The coronary calcium score interpretation by the cardiologist is attached. COMPARISON:  None. FINDINGS: Within the visualized portions of the thorax there are no suspicious appearing pulmonary nodules or masses, there is no acute consolidative airspace disease, no pleural effusions, no pneumothorax and no lymphadenopathy. Visualized portions of the upper abdomen are unremarkable. There are no aggressive appearing lytic or blastic lesions noted in the visualized portions of the skeleton. IMPRESSION: No significant incidental noncardiac findings are noted. Electronically Signed: By: Vinnie Langton M.D. On: 05/13/2021 10:37    Procedures Procedures   Medications Ordered in ED Medications - No data to display  ED Course  I have reviewed the triage vital signs and the nursing notes.  Pertinent labs & imaging results that were available during my care of the patient were reviewed by me and considered in my medical decision making (see chart for details).    MDM Rules/Calculators/A&P                          Chest pain with some typical features but moderate heart score. Shared decision making and will d/c with cardiology follow up. Chest pain free at this time. Low suspicion for PE or other acute causes.    Final Clinical Impression(s) / ED Diagnoses Final diagnoses:  Nonspecific chest pain    Rx / DC Orders ED Discharge Orders         Ordered    Ambulatory referral to Cardiology        05/03/21 0427           Saesha Llerenas, Corene Cornea, MD 05/13/21 2254

## 2021-05-14 ENCOUNTER — Telehealth (HOSPITAL_COMMUNITY): Payer: Self-pay | Admitting: *Deleted

## 2021-05-14 NOTE — Telephone Encounter (Signed)
Close encounter 

## 2021-05-15 ENCOUNTER — Encounter (HOSPITAL_COMMUNITY): Payer: BC Managed Care – PPO

## 2021-05-15 ENCOUNTER — Ambulatory Visit (HOSPITAL_COMMUNITY)
Admission: RE | Admit: 2021-05-15 | Discharge: 2021-05-15 | Disposition: A | Discharge status: Home / Self Care | Payer: BC Managed Care – PPO | Source: Ambulatory Visit | Attending: Cardiology | Admitting: Cardiology

## 2021-05-15 ENCOUNTER — Other Ambulatory Visit: Payer: Self-pay

## 2021-05-15 DIAGNOSIS — R0789 Other chest pain: Secondary | ICD-10-CM | POA: Diagnosis not present

## 2021-05-15 DIAGNOSIS — Z8249 Family history of ischemic heart disease and other diseases of the circulatory system: Secondary | ICD-10-CM | POA: Diagnosis not present

## 2021-05-15 LAB — MYOCARDIAL PERFUSION IMAGING
Estimated workload: 8.2 METS
Exercise duration (min): 7 min
Exercise duration (sec): 36 s
LV dias vol: 89 mL (ref 62–150)
LV sys vol: 40 mL
MPHR: 156 {beats}/min
Peak HR: 160 {beats}/min
Percent HR: 102 %
Rest HR: 84 {beats}/min
SDS: 0
SRS: 1
SSS: 1
TID: 0.85

## 2021-05-15 MED ORDER — TECHNETIUM TC 99M TETROFOSMIN IV KIT
29.8000 | PACK | Freq: Once | INTRAVENOUS | Status: AC | PRN
  Administered 2021-05-15: 29.8 via INTRAVENOUS
  Filled 2021-05-15: qty 30

## 2021-05-15 MED ORDER — TECHNETIUM TC 99M TETROFOSMIN IV KIT
10.2000 | PACK | Freq: Once | INTRAVENOUS | Status: AC | PRN
  Administered 2021-05-15: 10.2 via INTRAVENOUS
  Filled 2021-05-15: qty 11

## 2021-05-16 ENCOUNTER — Other Ambulatory Visit: Payer: Self-pay | Admitting: Internal Medicine

## 2021-05-16 DIAGNOSIS — I77810 Thoracic aortic ectasia: Secondary | ICD-10-CM

## 2021-05-24 ENCOUNTER — Other Ambulatory Visit: Payer: Self-pay | Admitting: *Deleted

## 2021-05-24 DIAGNOSIS — E782 Mixed hyperlipidemia: Secondary | ICD-10-CM

## 2021-05-24 MED ORDER — ATORVASTATIN CALCIUM 40 MG PO TABS: 40.0000 mg | ORAL_TABLET | Freq: Every day | ORAL | 3 refills | Status: DC

## 2021-05-28 DIAGNOSIS — Z87442 Personal history of urinary calculi: Secondary | ICD-10-CM | POA: Diagnosis not present

## 2021-05-28 DIAGNOSIS — N1831 Chronic kidney disease, stage 3a: Secondary | ICD-10-CM | POA: Diagnosis not present

## 2021-05-28 DIAGNOSIS — E785 Hyperlipidemia, unspecified: Secondary | ICD-10-CM | POA: Diagnosis not present

## 2021-05-28 DIAGNOSIS — Z905 Acquired absence of kidney: Secondary | ICD-10-CM | POA: Diagnosis not present

## 2021-06-25 ENCOUNTER — Other Ambulatory Visit: Payer: Self-pay | Admitting: Internal Medicine

## 2021-06-25 DIAGNOSIS — F900 Attention-deficit hyperactivity disorder, predominantly inattentive type: Secondary | ICD-10-CM

## 2021-06-25 MED ORDER — AMPHETAMINE-DEXTROAMPHET ER 20 MG PO CP24
20.0000 mg | ORAL_CAPSULE | ORAL | 0 refills | Status: DC
Start: 2021-06-25 — End: 2021-07-05

## 2021-06-25 MED ORDER — AMPHETAMINE-DEXTROAMPHETAMINE 10 MG PO TABS
10.0000 mg | ORAL_TABLET | Freq: Every day | ORAL | 0 refills | Status: DC | PRN
Start: 2021-06-25 — End: 2021-07-05

## 2021-06-25 NOTE — Telephone Encounter (Signed)
amphetamine-dextroamphetamine (ADDERALL XR) 20 MG 24 hr capsule  amphetamine-dextroamphetamine (ADDERALL) 10 MG tablet  [id number] - [place], [place] - [address] AT [address] Phone:  [phone]  Fax:  [phone]

## 2021-06-25 NOTE — Telephone Encounter (Signed)
Rx sent 

## 2021-06-25 NOTE — Telephone Encounter (Signed)
Patient has an appointment 07/05/21.  Okay to fill?

## 2021-07-05 ENCOUNTER — Telehealth (INDEPENDENT_AMBULATORY_CARE_PROVIDER_SITE_OTHER): Payer: BC Managed Care – PPO | Admitting: Internal Medicine

## 2021-07-05 DIAGNOSIS — F320 Major depressive disorder, single episode, mild: Secondary | ICD-10-CM

## 2021-07-05 DIAGNOSIS — E782 Mixed hyperlipidemia: Secondary | ICD-10-CM

## 2021-07-05 DIAGNOSIS — F900 Attention-deficit hyperactivity disorder, predominantly inattentive type: Secondary | ICD-10-CM | POA: Diagnosis not present

## 2021-07-05 MED ORDER — AMPHETAMINE-DEXTROAMPHET ER 20 MG PO CP24
20.0000 mg | ORAL_CAPSULE | ORAL | 0 refills | Status: DC
Start: 2021-07-05 — End: 2021-11-01

## 2021-07-05 MED ORDER — AMPHETAMINE-DEXTROAMPHETAMINE 10 MG PO TABS: 10.0000 mg | ORAL_TABLET | Freq: Every day | ORAL | 0 refills | Status: DC

## 2021-07-05 MED ORDER — AMPHETAMINE-DEXTROAMPHETAMINE 10 MG PO TABS: 10.0000 mg | ORAL_TABLET | Freq: Every day | ORAL | 0 refills | Status: DC | PRN

## 2021-07-05 MED ORDER — AMPHETAMINE-DEXTROAMPHET ER 20 MG PO CP24: 20.0000 mg | ORAL_CAPSULE | Freq: Every day | ORAL | 0 refills | Status: DC

## 2021-07-05 NOTE — Progress Notes (Signed)
Virtual Visit via Video Note  I connected with Alan Tyler on 07/05/21 at  9:30 AM EDT by a video enabled telemedicine application and verified that I am speaking with the correct person using two identifiers.  Location patient: home Location provider: work office Persons participating in the virtual visit: patient, provider  I discussed the limitations of evaluation and management by telemedicine and the availability of in person appointments. The patient expressed understanding and agreed to proceed.   HPI: This is a scheduled visit for Adderall refills per contract.  He takes 20 mg in the morning and an additional 10 mg at nighttime for his ADHD.  He is also on Wellbutrin for depression.  He feels like this combination works quite well for him.  Since I last saw him he visited the emergency department for chest pain and left arm numbness.  Work-up in the ED was negative but he was referred to Dr. Debara Pickett.  He has had a stress test as well as calcium CT scoring.  He is now on the Lipitor 40 mg that I had initially prescribed to him.  He has been on it for about a month and is tolerating it well.  He tells me that he is due to have right knee surgery on August 1.  I have not yet received clearance paperwork for him.   ROS: Constitutional: Denies fever, chills, diaphoresis, appetite change and fatigue.  HEENT: Denies photophobia, eye pain, redness, hearing loss, ear pain, congestion, sore throat, rhinorrhea, sneezing, mouth sores, trouble swallowing, neck pain, neck stiffness and tinnitus.   Respiratory: Denies SOB, DOE, cough, chest tightness,  and wheezing.   Cardiovascular: Denies chest pain, palpitations and leg swelling.  Gastrointestinal: Denies nausea, vomiting, abdominal pain, diarrhea, constipation, blood in stool and abdominal distention.  Genitourinary: Denies dysuria, urgency, frequency, hematuria, flank pain and difficulty urinating.  Endocrine: Denies: hot or cold  intolerance, sweats, changes in hair or nails, polyuria, polydipsia. Musculoskeletal: Denies myalgias, back pain, joint swelling, arthralgias and gait problem.  Skin: Denies pallor, rash and wound.  Neurological: Denies dizziness, seizures, syncope, weakness, light-headedness, numbness and headaches.  Hematological: Denies adenopathy. Easy bruising, personal or family bleeding history  Psychiatric/Behavioral: Denies suicidal ideation, mood changes, confusion, nervousness, sleep disturbance and agitation   Past Medical History:  Diagnosis Date   Arthritis    Cancer ([place]) 2003   kidney - 2003   Chronic kidney disease    kidney stones and (R) nephrectomy   Colon polyps    Depression    GERD (gastroesophageal reflux disease)    Hyperlipidemia    PVC's (premature ventricular contractions)    arrhythmia    Past Surgical History:  Procedure Laterality Date   CATARACT EXTRACTION     right eye   COLONOSCOPY  2014   NEPHRECTOMY  2003   (R) for cancer tumor   RETINAL DETACHMENT SURGERY  2012    Family History  Problem Relation Age of Onset   Arthritis Mother    Depression Mother    Colon cancer Mother 44       part of colon was removed   Alcohol abuse Father    Heart disease Father    Hyperlipidemia Father    Hyperlipidemia Maternal Grandmother    Hyperlipidemia Maternal Grandfather    Hyperlipidemia Paternal Grandmother    Diabetes Paternal Grandmother    Rectal cancer Paternal Grandmother    Arthritis Paternal Grandfather    Hyperlipidemia Paternal Grandfather    Stomach cancer Neg  Hx    Esophageal cancer Neg Hx     SOCIAL HX:   reports that he quit smoking about 19 years ago. He quit smokeless tobacco use about 18 years ago. He reports current alcohol use of about 4.0 standard drinks of alcohol per week. He reports that he does not use drugs.   Current Outpatient Medications:    amphetamine-dextroamphetamine (ADDERALL XR) 20 MG 24 hr capsule, Take 1 capsule (20 mg  total) by mouth daily., Disp: 30 capsule, Rfl: 0   amphetamine-dextroamphetamine (ADDERALL XR) 20 MG 24 hr capsule, Take 1 capsule (20 mg total) by mouth daily., Disp: 30 capsule, Rfl: 0   amphetamine-dextroamphetamine (ADDERALL) 10 MG tablet, Take 1 tablet (10 mg total) by mouth daily with breakfast., Disp: 30 tablet, Rfl: 0   atorvastatin (LIPITOR) 40 MG tablet, Take 1 tablet (40 mg total) by mouth daily., Disp: 90 tablet, Rfl: 3   buPROPion (WELLBUTRIN XL) 300 MG 24 hr tablet, TAKE 1 TABLET EVERY MORNING, Disp: 90 tablet, Rfl: 1   Multiple Vitamin (ONE-A-DAY MENS PO), Take 1 tablet by mouth daily., Disp: , Rfl:    OVER THE COUNTER MEDICATION, NATURAL PSYLIUM HUSK FIBER 0.52 grams,(5Capsules) daily, Disp: , Rfl:    amphetamine-dextroamphetamine (ADDERALL XR) 20 MG 24 hr capsule, Take 1 capsule (20 mg total) by mouth every morning., Disp: 30 capsule, Rfl: 0   amphetamine-dextroamphetamine (ADDERALL) 10 MG tablet, Take 1 tablet (10 mg total) by mouth daily as needed., Disp: 30 tablet, Rfl: 0   amphetamine-dextroamphetamine (ADDERALL) 10 MG tablet, Take 1 tablet (10 mg total) by mouth daily as needed., Disp: 30 tablet, Rfl: 0  Current Facility-Administered Medications:    0.9 %  sodium chloride infusion, 500 mL, Intravenous, Once, Fuller Plan Pricilla Riffle, MD  EXAM:   VITALS per patient if applicable: None reported  GENERAL: alert, oriented, appears well and in no acute distress  HEENT: atraumatic, conjunttiva clear, no obvious abnormalities on inspection of external nose and ears  NECK: normal movements of the head and neck  LUNGS: on inspection no signs of respiratory distress, breathing rate appears normal, no obvious gross increased work of breathing, gasping or wheezing  CV: no obvious cyanosis  MS: moves all visible extremities without noticeable abnormality  PSYCH/NEURO: pleasant and cooperative, no obvious depression or anxiety, speech and thought processing grossly intact  ASSESSMENT  AND PLAN:   Current mild episode of major depressive disorder, unspecified whether recurrent ([place]) -Well-controlled on Wellbutrin.  Attention deficit hyperactivity disorder (ADHD), predominantly inattentive type  -PDMP reviewed, no red flags, overdose risk score of 0. -I will refill his Adderall 20 mg in the morning plus an additional 10 mg in the afternoon for a total of 30 tablets of each x3 months.  Mixed hyperlipidemia -He has resumed atorvastatin 40 mg daily. -Recheck lipids in 3 months.  Time spent: 32 minutes reviewing chart, discussing with patient, formulating plan of care and medication refills.     I discussed the assessment and treatment plan with the patient. The patient was provided an opportunity to ask questions and all were answered. The patient agreed with the plan and demonstrated an understanding of the instructions.   The patient was advised to call back or seek an in-person evaluation if the symptoms worsen or if the condition fails to improve as anticipated.    Lelon Frohlich, MD  [facility]

## 2021-07-08 ENCOUNTER — Other Ambulatory Visit: Payer: Self-pay | Admitting: Internal Medicine

## 2021-07-09 ENCOUNTER — Encounter (INDEPENDENT_AMBULATORY_CARE_PROVIDER_SITE_OTHER): Payer: BC Managed Care – PPO | Admitting: Ophthalmology

## 2021-07-10 ENCOUNTER — Encounter (INDEPENDENT_AMBULATORY_CARE_PROVIDER_SITE_OTHER): Payer: BC Managed Care – PPO | Admitting: Ophthalmology

## 2021-07-11 ENCOUNTER — Telehealth: Payer: Self-pay

## 2021-07-11 NOTE — Telephone Encounter (Signed)
   Name: Alan Tyler  DOB: 1955-12-31  MRN: [id number]  Primary Cardiologist: None  Chart reviewed as part of pre-operative protocol coverage. He has an upcoming visit with Coletta Memos, NP 07/17/21 at which time his preop status can be addressed. "PREOP" added to the appointment notes so the provider is aware.   Pre-op covering staff: - Please contact requesting surgeon's office via preferred method (i.e, phone, fax) to inform them of need for appointment prior to surgery.   Abigail Butts, PA-C  07/11/2021, 3:10 PM

## 2021-07-11 NOTE — Telephone Encounter (Signed)
   [facility] Pre-operative Risk Assessment    Request for surgical clearance:  What type of surgery is being performed # 1 Right total knee arthoplasty   # 2 Left total knee arthoplasty  When is this surgery scheduled # 1 07/29/21   # 2  09/06/21  What type of clearance is required  medical   Are there any medications that need to be held prior to surgery and how long  None  Practice name and name of physician performing surgery [facility]  Dr.Matthew Alvan Dame  What is the office phone number [phone]   7.   What is the office fax number        [phone]  8.   Anesthesia type  Spinal   Kathyrn Lass 07/11/2021, 1:19 PM  _________________________________________________________________   (provider comments below)

## 2021-07-11 NOTE — Telephone Encounter (Signed)
Routed back to [facility] to make them aware pt is scheduled for an appointment 07/17/21 and clearance will be addressed at that time.

## 2021-07-15 DIAGNOSIS — Z20822 Contact with and (suspected) exposure to covid-19: Secondary | ICD-10-CM | POA: Diagnosis not present

## 2021-07-16 ENCOUNTER — Telehealth: Payer: Self-pay | Admitting: Cardiology

## 2021-07-16 NOTE — Telephone Encounter (Signed)
Returned call to patient who states that he started having Covid symptoms on Friday and tested positive on Saturday. Patient states he has an appointment for follow up testing/pre op clearance for 7/20 with Coletta Memos. Patient states that if this needs to be rescheduled could be be seen before his 8/1 surgery date for his R knee replacement. Patient states today that he feels better and that his symptoms are almost gone. Patient states that he is just tired now. Advised patient that I would forward message to Coletta Memos NP for him to review and advise. Patient verbalized understanding.

## 2021-07-16 NOTE — Telephone Encounter (Signed)
Patient has an appointment scheduled for 07/17/21 with Coletta Memos, NP. However, he states he tested positive for COVID on 07/13/21. He states he hasn't had a fever in 24 hours and today he only has mild lingering symptoms--fatigue and dry cough. If he has to reschedule is there anywhere else he can be worked in soon for clearance? Please advise.

## 2021-07-16 NOTE — Telephone Encounter (Signed)
Returned call to patient. Made patient aware of the following recommendations from Coletta Memos- NP.    Alan Tyler,   Alan Tyler had a recent office visit with Dr. Debara Pickett.  After the visit he underwent coronary calcium scoring and stress testing.  His calcium score was/showed intermediate risk and his stress test showed normal LVEF with low risk.  Due to his positive COVID test we will transition him to a virtual visit during the same time on 07/17/2021.  Thank you for your help.   JC   Changed patients appointment to Virtual on 7/20. Patient aware of MyChart video visit instructions and verbalized understanding.

## 2021-07-16 NOTE — Progress Notes (Signed)
Virtual Visit via Video Note   This visit type was conducted due to national recommendations for restrictions regarding the COVID-19 Pandemic (e.g. social distancing) in an effort to limit this patient's exposure and mitigate transmission in our community.  Due to his co-morbid illnesses, this patient is at least at moderate risk for complications without adequate follow up.  This format is felt to be most appropriate for this patient at this time.  All issues noted in this document were discussed and addressed.  A limited physical exam was performed with this format.  Please refer to the patient's chart for his consent to telehealth for [facility].  Video Connection Lost Video connection was lost at > 50% of the duration of this visit, at which time the remainder of the visit was completed via audio only.    Evaluation Performed:  Follow-up visit  This visit type was conducted due to national recommendations for restrictions regarding the COVID-19 Pandemic (e.g. social distancing).  This format is felt to be most appropriate for this patient at this time.  All issues noted in this document were discussed and addressed.  No physical exam was performed (except for noted visual exam findings with Video Visits).  Please refer to the patient's chart (MyChart message for video visits and phone note for telephone visits) for the patient's consent to telehealth for [facility]  Date:  07/17/2021   ID:  Alan Tyler, DOB August 25, 1956, MRN [id number]  Patient Location:  [address] [place] [address]   Provider location:     [facility] [address] Office [phone] Fax [phone]   PCP:  Isaac Bliss, Alan Halsted, Alan Tyler  Cardiologist:  Pixie Casino, Alan Tyler  Electrophysiologist:  None   Chief Complaint: Follow-up for nonobstructive CAD and preoperative cardiac evaluation  History of Present Illness:    Alan Tyler  is a 65 y.o. male who presents via audio/video conferencing for a telehealth visit today.  Patient verified DOB and address.  Alan Tyler has a PMH of mixed hyperlipidemia, kidney CA 2003 status post nephrectomy, chronic CKD, depression, colon polyps, GERD, and PVCs.   He was last seen in the clinic by Dr. Debara Pickett on 05/09/2021.  He had been referred for an evaluation of his atypical chest pain.  He underwent stress testing in 2009 and previously saw Dr. Martinique in 2014.  During that time he was having PVCs which had been noted during blood donation.  He underwent an exercise stress echocardiogram which was negative for ischemia.  He was also noted to have atypical chest pain at that time.  He has a family history of heart disease with a father who had a premature onset of coronary artery disease.  He reported that he had been working and noticed some left-sided chest pain as well as numbness and tingling in his left arm.  His symptoms lasted for 15-20 minutes.  His troponins were drawn and were negative.  During the time of his visit he denied recurrent anginal symptoms.  His cholesterol panel was reviewed and showed a total cholesterol of 199, HDL 39, and LDL 137.  This was discussed with his PCP who recommended lifestyle modification with increased exercise and improve diet.   He underwent coronary calcium scoring which showed a total score of 123 which placed him in the 61st percentile for age, race, and sex matched controls.  He was also noted to have a dilated ascending aorta measuring  40 mm.  He also underwent nuclear stress testing 05/15/2021 which showed no ischemia and an LVEF of 54%.  Due to his intermediate calcium score, a goal LDL of less than 100 was recommended.  He was started on atorvastatin 40 mg daily, starting an exercise program was recommended, and the plan to repeat his lipid panel in August 2022 was made.   He is contacted via phone today for follow-up evaluation (D/T COVID-19  infection) and preoperative cardiac evaluation.  He states he is recovering well from his COVID-19 infection.  He reports that his symptoms were fever body aches cough and runny nose.  He reports the symptoms were mild felt like allergies or a summer cold.  He has not had any fevers for 2 days and did not require any antiviral medications.  We reviewed his coronary calcium scoring as well as his stress test results.  He expressed understanding.  I will have him repeat his fasting lipid panel in 4 weeks, increase his physical activity as tolerated, and have him follow-up with Dr. Debara Pickett in 3 to 4 months.   Today he  denies chest pain, shortness of breath, lower extremity edema, fatigue, palpitations, melena, hematuria, hemoptysis, diaphoresis, weakness, presyncope, syncope, orthopnea, and PND.  The patient does symptoms concerning for COVID-19 infection (fever, chills, cough, or new SHORTNESS OF BREATH).    Prior CV studies:   The following studies were reviewed today:  Coronary calcium scoring 05/13/2021 FINDINGS: Coronary arteries: Normal origins.   Coronary Calcium Score:   Left main: 29   Left anterior descending artery: 69   Left circumflex artery: 15   Right coronary artery: 10   Total: 123   Percentile: 61   Pericardium: Normal.   Ascending Aorta: Dilated ascending aorta measuring 29mm   Non-cardiac: See separate report from [facility].   IMPRESSION: 1. Coronary calcium score of 123. This was 61st percentile for age-, race-, and sex-matched controls.   2.  Dilated ascending aorta measuring 43mm   RECOMMENDATIONS: Coronary artery calcium (CAC) score is a strong predictor of incident coronary heart disease (CHD) and provides predictive information beyond traditional risk factors. CAC scoring is reasonable to use in the decision to withhold, postpone, or initiate statin therapy in intermediate-risk or selected borderline-risk asymptomatic adults (age 88-75  years and LDL-C >=70 to <190 mg/dL) who do not have diabetes or established atherosclerotic cardiovascular disease (ASCVD).* In intermediate-risk (10-year ASCVD risk >=7.5% to <20%) adults or selected borderline-risk (10-year ASCVD risk >=5% to <7.5%) adults in whom a CAC score is measured for the purpose of making a treatment decision the following recommendations have been made:   If CAC=0, it is reasonable to withhold statin therapy and reassess in 5 to 10 years, as long as higher risk conditions are absent (diabetes mellitus, family history of premature CHD in first degree relatives (males <55 years; females <65 years), cigarette smoking, or LDL >=190 mg/dL).   If CAC is 1 to 99, it is reasonable to initiate statin therapy for patients >=57 years of age.   If CAC is >=100 or >=75th percentile, it is reasonable to initiate statin therapy at any age.   Cardiology referral should be considered for patients with CAC scores >=400 or >=75th percentile.   *2018 AHA/ACC/AACVPR/AAPA/ABC/ACPM/ADA/AGS/APhA/ASPC/NLA/PCNA Guideline on the Management of Blood Cholesterol: A Report of the American College of Cardiology/American Heart Association Task Force on Clinical Practice Guidelines. J Am Coll Cardiol. 2019;73(24):3168-3209.   Oswaldo Milian, Alan Tyler     Nuclear stress test  05/15/2021 Nuclear stress EF: 54%. The left ventricular ejection fraction is mildly decreased (45-54%). Blood pressure demonstrated a normal response to exercise. There was no ST segment deviation noted during stress. The study is normal. This is a low risk study.   Normal resting and stress perfusion. No ischemia or infarction EF Diaphragmatic attenuation EF normal 54% Normal ETT with exercise  Past Medical History:  Diagnosis Date   Arthritis    Cancer ([place]) 2003   kidney - 2003   Chronic kidney disease    kidney stones and (R) nephrectomy   Colon polyps    Depression    GERD (gastroesophageal  reflux disease)    Hyperlipidemia    PVC's (premature ventricular contractions)    arrhythmia   Past Surgical History:  Procedure Laterality Date   CATARACT EXTRACTION     right eye   COLONOSCOPY  2014   NEPHRECTOMY  2003   (R) for cancer tumor   RETINAL DETACHMENT SURGERY  2012     No outpatient medications have been marked as taking for the 07/17/21 encounter (Video Visit) with Deberah Pelton, NP.     Allergies:   Sulfa drugs cross reactors   Social History   Tobacco Use   Smoking status: Former    Types: Cigarettes    Quit date: 12/29/2001    Years since quitting: 19.5   Smokeless tobacco: Former    Quit date: 12/27/2002  Vaping Use   Vaping Use: Never used  Substance Use Topics   Alcohol use: Yes    Alcohol/week: 4.0 standard drinks    Types: 4 Standard drinks or equivalent per week   Drug use: No     Family Hx: The patient's family history includes Alcohol abuse in his father; Arthritis in his mother and paternal grandfather; Colon cancer (age of onset: 65) in his mother; Depression in his mother; Diabetes in his paternal grandmother; Heart disease in his father; Hyperlipidemia in his father, maternal grandfather, maternal grandmother, paternal grandfather, and paternal grandmother; Rectal cancer in his paternal grandmother. There is no history of Stomach cancer or Esophageal cancer.  ROS:   Please see the history of present illness.     All other systems reviewed and are negative.   Labs/Other Tests and Data Reviewed:    Recent Labs: 03/26/2021: ALT 18; TSH 3.93 05/02/2021: BUN 13; Creatinine, Ser 1.48; Hemoglobin 15.1; Platelets 234; Potassium 4.3; Sodium 138   Recent Lipid Panel Lab Results  Component Value Date/Time   CHOL 199 03/26/2021 09:51 AM   TRIG 112.0 03/26/2021 09:51 AM   HDL 39.20 03/26/2021 09:51 AM   CHOLHDL 5 03/26/2021 09:51 AM   LDLCALC 137 (H) 03/26/2021 09:51 AM   LDLDIRECT 163.2 11/29/2012 08:05 AM    Wt Readings from Last 3  Encounters:  07/17/21 226 lb (102.5 kg)  05/15/21 232 lb (105.2 kg)  05/09/21 232 lb 3.2 oz (105.3 kg)     Exam:    Vital Signs:  BP (!) 128/94 (BP Location: Left Arm)   Ht 5\' 7"  (1.702 m)   Wt 226 lb (102.5 kg)   BMI 35.40 kg/m    Well nourished, well developed male in no  acute distress.   ASSESSMENT & PLAN:    1.  .  Nonobstructive CAD-no chest pain today.  Nuclear stress test 05/15/2021 showed no ischemia and EF 54%.  Calcium scoring showed a total calcium score of 123 and dilated ascending aorta 40 mm. Continue atorvastatin Heart healthy low-sodium diet-salty 6 given  Increase physical activity as tolerated Repeat fasting lipids and LFTs 8/22   Mixed hyperlipidemia-03/26/2021: Cholesterol 199; HDL 39.20; LDL Cholesterol 137; Triglycerides 112.0; VLDL 22.4 Continue atorvastatin Heart healthy low-sodium high-fiber diet Increase physical activity as tolerated   Preoperative cardiac evaluation-right total knee arthroplasty 07/29/2021, left total knee arthroplasty 09/06/2021-[facility], Dr. Paralee Cancel       Primary Cardiologist: Pixie Casino, Alan Tyler   Chart reviewed as part of pre-operative protocol coverage. Given past medical history and time since last visit, based on ACC/AHA guidelines, Chananya Canizalez would be at acceptable risk for the planned procedure without further cardiovascular testing.   His RCRI is a class I risk, 0.4% risk of major cardiac event.   Patient was advised that if he develops new symptoms prior to surgery to contact our office to arrange a follow-up appointment.  He verbalized understanding.   I will route this recommendation to the requesting party via Epic fax function and remove from pre-op pool.   Please call with questions.     Disposition: Follow-up with Dr. Debara Pickett as scheduled.  COVID-19 Education: The signs and symptoms of COVID-19 were discussed with the patient and how to seek care for testing (follow up with PCP or arrange  E-visit).  The importance of social distancing was discussed today.  Patient Risk:   After full review of this patients clinical status, I feel that they are at least moderate risk at this time.  Time:   Today, I have spent 15 minutes with the patient with telehealth technology discussing diet, exercise, medication, cholesterol, and preoperative cardiac evaluation.     Medication Adjustments/Labs and Tests Ordered: Current medicines are reviewed at length with the patient today.  Concerns regarding medicines are outlined above.   Tests Ordered: Orders Placed This Encounter  Procedures   Lipid Profile   Hepatic function panel   Medication Changes: No orders of the defined types were placed in this encounter.   Disposition:  in 4 month(s)  Signed, Jossie Ng. Henryk Ursin NP-C    08/02/2019 11:58 AM    [address] Office [phone] Fax [phone]

## 2021-07-17 ENCOUNTER — Telehealth (INDEPENDENT_AMBULATORY_CARE_PROVIDER_SITE_OTHER): Payer: BC Managed Care – PPO | Admitting: General Practice

## 2021-07-17 ENCOUNTER — Encounter: Payer: Self-pay | Admitting: General Practice

## 2021-07-17 VITALS — BP 128/94 | Ht 67.0 in | Wt 226.0 lb

## 2021-07-17 DIAGNOSIS — Z79899 Other long term (current) drug therapy: Secondary | ICD-10-CM

## 2021-07-17 DIAGNOSIS — I2510 Atherosclerotic heart disease of native coronary artery without angina pectoris: Secondary | ICD-10-CM

## 2021-07-17 DIAGNOSIS — E782 Mixed hyperlipidemia: Secondary | ICD-10-CM

## 2021-07-17 DIAGNOSIS — Z01810 Encounter for preprocedural cardiovascular examination: Secondary | ICD-10-CM | POA: Diagnosis not present

## 2021-07-17 NOTE — Patient Instructions (Addendum)
Medication Instructions:  The current medical regimen is effective;  continue present plan and medications as directed. Please refer to the Current Medication list given to you today.  *If you need a refill on your cardiac medications before your next appointment, please call your pharmacy*  Lab Work: FASTING LIPID AND LFT IN 4 WEEKS If you have labs (blood work) drawn today and your tests are completely normal, you will receive your results only by:  [place] (if you have MyChart) OR A paper copy in the mail.  If you have any lab test that is abnormal or we need to change your treatment, we will call you to review the results. You may go to any [facility] that is convenient for you however, we do have a lab in our office that is able to assist you. You DO NOT need an appointment for our lab. The lab is open 8:00am and closes at 4:00pm. Lunch 12:45 - 1:45pm.  Special Instructions FOLLOW UP HIGH FIBER DIET-ATTACHED  PLEASE READ AND FOLLOW SALTY 6-ATTACHED-1,800 mg daily  PLEASE INCREASE PHYSICAL ACTIVITY AS TOLERATED-GOAL IS 150 MINUTES/WEEKLY DO NOT INCREASE MORE THAN 10% WEEKLY  Follow-Up: Your next appointment:  3-4 week(s) In Person with K. Mali Hilty, MD OR IF UNAVAILABLE JESSE CLEAVER, FNP-C   At [facility], you and your health needs are our priority.  As part of our continuing mission to provide you with exceptional heart care, we have created designated Provider Care Teams.  These Care Teams include your primary Cardiologist (physician) and Advanced Practice Providers (APPs -  Physician Assistants and Nurse Practitioners) who all work together to provide you with the care you need, when you need it.        High-Fiber Eating Plan Fiber, also called dietary fiber, is a type of carbohydrate. It is found foods such as fruits, vegetables, whole grains, and beans. A high-fiber diet can have many health benefits. Your health care provider may recommend a high-fiber diet to  help: Prevent constipation. Fiber can make your bowel movements more regular. Lower your cholesterol. Relieve the following conditions: Inflammation of veins in the anus (hemorrhoids). Inflammation of specific areas of the digestive tract (uncomplicated diverticulosis). A problem of the large intestine, also called the colon, that sometimes causes pain and diarrhea (irritable bowel syndrome, or IBS). Prevent overeating as part of a weight-loss plan. Prevent heart disease, type 2 diabetes, and certain cancers. What are tips for following this plan? Reading food labels  Check the nutrition facts label on food products for the amount of dietary fiber. Choose foods that have 5 grams of fiber or more per serving. The goals for recommended daily fiber intake include: Men (age 34 or younger): 34-38 g. Men (over age 88): 28-34 g. Women (age 44 or younger): 25-28 g. Women (over age 93): 22-25 g. Your daily fiber goal is _____________ g. Shopping Choose whole fruits and vegetables instead of processed forms, such as apple juice or applesauce. Choose a wide variety of high-fiber foods such as avocados, lentils, oats, and kidney beans. Read the nutrition facts label of the foods you choose. Be aware of foods with added fiber. These foods often have high sugar and sodium amounts per serving. Cooking Use whole-grain flour for baking and cooking. Cook with brown rice instead of white rice. Meal planning Start the day with a breakfast that is high in fiber, such as a cereal that contains 5 g of fiber or more per serving. Eat breads and cereals that are  made with whole-grain flour instead of refined flour or white flour. Eat brown rice, bulgur wheat, or millet instead of white rice. Use beans in place of meat in soups, salads, and pasta dishes. Be sure that half of the grains you eat each day are whole grains. General information You can get the recommended daily intake of dietary fiber by: Eating a  variety of fruits, vegetables, grains, nuts, and beans. Taking a fiber supplement if you are not able to take in enough fiber in your diet. It is better to get fiber through food than from a supplement. Gradually increase how much fiber you consume. If you increase your intake of dietary fiber too quickly, you may have bloating, cramping, or gas. Drink plenty of water to help you digest fiber. Choose high-fiber snacks, such as berries, raw vegetables, nuts, and popcorn. What foods should I eat? Fruits Berries. Pears. Apples. Oranges. Avocado. Prunes and raisins. Dried figs. Vegetables Sweet potatoes. Spinach. Kale. Artichokes. Cabbage. Broccoli. Cauliflower.Green peas. Carrots. Squash. Grains Whole-grain breads. Multigrain cereal. Oats and oatmeal. Brown rice. Barley.Bulgur wheat. [place]. Quinoa. Bran muffins. Popcorn. Rye wafer crackers. Meats and other proteins Navy beans, kidney beans, and pinto beans. Soybeans. Split peas. Lentils. Nutsand seeds. Dairy Fiber-fortified yogurt. Beverages Fiber-fortified soy milk. Fiber-fortified orange juice. Other foods Fiber bars. The items listed above may not be a complete list of recommended foods and beverages. Contact a dietitian for more information. What foods should I avoid? Fruits Fruit juice. Cooked, strained fruit. Vegetables Fried potatoes. Canned vegetables. Well-cooked vegetables. Grains White bread. Pasta made with refined flour. White rice. Meats and other proteins Fatty cuts of meat. Fried chicken or fried fish. Dairy Milk. Yogurt. Cream cheese. Sour cream. Fats and oils Butters. Beverages Soft drinks. Other foods Cakes and pastries. The items listed above may not be a complete list of foods and beverages to avoid. Talk with your dietitian about what choices are best for you. Summary Fiber is a type of carbohydrate. It is found in foods such as fruits, vegetables, whole grains, and beans. A high-fiber diet has many  benefits. It can help to prevent constipation, lower blood cholesterol, aid weight loss, and reduce your risk of heart disease, diabetes, and certain cancers. Increase your intake of fiber gradually. Increasing fiber too quickly may cause cramping, bloating, and gas. Drink plenty of water while you increase the amount of fiber you consume. The best sources of fiber include whole fruits and vegetables, whole grains, nuts, seeds, and beans. This information is not intended to replace advice given to you by your health care provider. Make sure you discuss any questions you have with your healthcare provider. Document Revised: 04/19/2020 Document Reviewed: 04/19/2020 Elsevier Patient Education  2022 [employer].

## 2021-07-22 ENCOUNTER — Telehealth: Payer: Self-pay | Admitting: Internal Medicine

## 2021-07-22 MED ORDER — ATORVASTATIN CALCIUM 40 MG PO TABS: 40.0000 mg | ORAL_TABLET | Freq: Every day | ORAL | 3 refills | Status: DC

## 2021-07-22 NOTE — Telephone Encounter (Signed)
*  STAT* If patient is at the pharmacy, call can be transferred to refill team.   1. Which medications need to be refilled? (please list name of each medication and dose if known) atorvastatin (LIPITOR) 40 MG tablet  2. Which pharmacy/location (including street and city if local pharmacy) is medication to be sent to?    3. Do they need a 30 day or 90 day supply? [place]

## 2021-08-05 ENCOUNTER — Other Ambulatory Visit: Payer: Self-pay | Admitting: *Deleted

## 2021-08-05 DIAGNOSIS — E782 Mixed hyperlipidemia: Secondary | ICD-10-CM

## 2021-08-06 ENCOUNTER — Other Ambulatory Visit: Payer: Self-pay

## 2021-08-06 ENCOUNTER — Ambulatory Visit (INDEPENDENT_AMBULATORY_CARE_PROVIDER_SITE_OTHER): Payer: BC Managed Care – PPO | Admitting: Ophthalmology

## 2021-08-06 ENCOUNTER — Encounter (INDEPENDENT_AMBULATORY_CARE_PROVIDER_SITE_OTHER): Payer: Self-pay | Admitting: Ophthalmology

## 2021-08-06 DIAGNOSIS — H35352 Cystoid macular degeneration, left eye: Secondary | ICD-10-CM

## 2021-08-06 DIAGNOSIS — Z8669 Personal history of other diseases of the nervous system and sense organs: Secondary | ICD-10-CM

## 2021-08-06 DIAGNOSIS — H2702 Aphakia, left eye: Secondary | ICD-10-CM | POA: Diagnosis not present

## 2021-08-06 NOTE — Assessment & Plan Note (Signed)
Residual cystoid change in the perifoveal location not active CME but in fact residual from prior CME.  This contour has been stable now for many years OS.  No active disease.  Observe

## 2021-08-06 NOTE — Progress Notes (Signed)
08/06/2021     CHIEF COMPLAINT Patient presents for Retina Follow Up (6 month fu OS and OCT/FP/Pt states VA OU stable since last visit. Pt denies FOL, floaters, or ocular pain OU. /)   HISTORY OF PRESENT ILLNESS: Alan Tyler is a 65 y.o. male who presents to the clinic today for:   HPI     Retina Follow Up           Diagnosis: Other (CME OS )   Laterality: left eye   Onset: 6 months ago   Severity: mild   Duration: 6 months   Course: stable   Comments: 6 month fu OS and OCT/FP Pt states VA OU stable since last visit. Pt denies FOL, floaters, or ocular pain OU.         Last edited by Kendra Opitz, COA on 08/06/2021  8:04 AM.      Referring physician: Isaac Bliss, Rayford Halsted, MD [place],  [place] [id number]  HISTORICAL INFORMATION:   Selected notes from the MEDICAL RECORD NUMBER    Lab Results  Component Value Date   HGBA1C 5.7 03/26/2021     CURRENT MEDICATIONS: No current outpatient medications on file. (Ophthalmic Drugs)   No current facility-administered medications for this visit. (Ophthalmic Drugs)   Current Outpatient Medications (Other)  Medication Sig   amphetamine-dextroamphetamine (ADDERALL XR) 20 MG 24 hr capsule Take 1 capsule (20 mg total) by mouth every morning.   amphetamine-dextroamphetamine (ADDERALL XR) 20 MG 24 hr capsule Take 1 capsule (20 mg total) by mouth daily.   amphetamine-dextroamphetamine (ADDERALL XR) 20 MG 24 hr capsule Take 1 capsule (20 mg total) by mouth daily.   amphetamine-dextroamphetamine (ADDERALL) 10 MG tablet Take 1 tablet (10 mg total) by mouth daily as needed.   amphetamine-dextroamphetamine (ADDERALL) 10 MG tablet Take 1 tablet (10 mg total) by mouth daily as needed.   amphetamine-dextroamphetamine (ADDERALL) 10 MG tablet Take 1 tablet (10 mg total) by mouth daily with breakfast.   atorvastatin (LIPITOR) 40 MG tablet Take 1 tablet (40 mg total) by mouth daily.   buPROPion (WELLBUTRIN XL) 300  MG 24 hr tablet TAKE 1 TABLET EVERY MORNING   Multiple Vitamin (ONE-A-DAY MENS PO) Take 1 tablet by mouth daily. (Patient not taking: Reported on 07/17/2021)   OVER THE COUNTER MEDICATION NATURAL PSYLIUM HUSK FIBER 0.52 grams,(5Capsules) daily (Patient not taking: Reported on 07/17/2021)   No current facility-administered medications for this visit. (Other)      REVIEW OF SYSTEMS:    ALLERGIES Allergies  Allergen Reactions   Sulfa Drugs Cross Reactors     Per pt: unknown    PAST MEDICAL HISTORY Past Medical History:  Diagnosis Date   Arthritis    Cancer ([place]) 2003   kidney - 2003   Chronic kidney disease    kidney stones and (R) nephrectomy   Colon polyps    Depression    GERD (gastroesophageal reflux disease)    Hyperlipidemia    PVC's (premature ventricular contractions)    arrhythmia   Past Surgical History:  Procedure Laterality Date   CATARACT EXTRACTION     right eye   COLONOSCOPY  2014   NEPHRECTOMY  2003   (R) for cancer tumor   RETINAL DETACHMENT SURGERY  2012    FAMILY HISTORY Family History  Problem Relation Age of Onset   Arthritis Mother    Depression Mother    Colon cancer Mother 65       part of  colon was removed   Alcohol abuse Father    Heart disease Father    Hyperlipidemia Father    Hyperlipidemia Maternal Grandmother    Hyperlipidemia Maternal Grandfather    Hyperlipidemia Paternal Grandmother    Diabetes Paternal Grandmother    Rectal cancer Paternal Grandmother    Arthritis Paternal Grandfather    Hyperlipidemia Paternal Grandfather    Stomach cancer Neg Hx    Esophageal cancer Neg Hx     SOCIAL HISTORY Social History   Tobacco Use   Smoking status: Former    Types: Cigarettes    Quit date: 12/29/2001    Years since quitting: 19.6   Smokeless tobacco: Former    Quit date: 12/27/2002  Vaping Use   Vaping Use: Never used  Substance Use Topics   Alcohol use: Yes    Alcohol/week: 4.0 standard drinks    Types: 4 Standard  drinks or equivalent per week   Drug use: No         OPHTHALMIC EXAM:  Base Eye Exam     Visual Acuity (ETDRS)       Right Left   Dist [place] 20/20 CF at 3'         Tonometry (Tonopen, 8:07 AM)       Right Left   Pressure 14 12         Pupils       Pupils Dark Light Shape React APD   Right PERRL 3 2 Round Slow None   Left PERRL 3 3 Round Minimal None         Visual Fields       Left Right   Restrictions Partial outer superior temporal, superior nasal deficiencies          Extraocular Movement       Right Left    Full Full         Neuro/Psych     Oriented x3: Yes   Mood/Affect: Normal         Dilation     Left eye: 1.0% Mydriacyl, 2.5% Phenylephrine @ 8:07 AM           Slit Lamp and Fundus Exam     External Exam       Right Left   External Normal Normal         Slit Lamp Exam       Right Left   Lids/Lashes Normal Normal   Conjunctiva/Sclera White and quiet White and quiet   Cornea Clear Clear   Anterior Chamber Deep and quiet Deep and quiet   Iris Round and reactive Round and reactive, inferior patent PI   Lens Posterior chamber intraocular lens Aphakia   Anterior Vitreous Normal Normal         Fundus Exam       Right Left   Posterior Vitreous  Clear, vitrectomized   Disc  Normal   C/D Ratio  0.25   Macula  Normal   Vessels  Normal   Periphery Normal Good scleral buckle 360 with cryopexy and retinopexy.  Retina attached 360            IMAGING AND PROCEDURES  Imaging and Procedures for 08/06/21  OCT, Retina - OU - Both Eyes       Right Eye Quality was good. Scan locations included subfoveal. Central Foveal Thickness: 302. Progression has been stable. Findings include normal foveal contour.   Left Eye Quality was good. Scan locations included subfoveal. Central Foveal Thickness: 327.  Progression has been stable. Findings include abnormal foveal contour, cystoid macular edema.   Notes Minor chronic  perifoveal CME which is been unchanged now for many years left eye,No overt thickening, will thus observe     Color Fundus Photography Optos - OU - Both Eyes       Right Eye Progression has no prior data.   Left Eye Progression has been stable.   Notes Clear media, retina attached OS  No views OD             ASSESSMENT/PLAN:  Cystoid macular edema of left eye Residual cystoid change in the perifoveal location not active CME but in fact residual from prior CME.  This contour has been stable now for many years OS.  No active disease.  Observe  Aphakia of eye, left OS, discussed recs restoration of pseudophakic status via Yamane scleral tunnel technique using Zeiss CT [place] type lens.  Patient is aware this surgical option to restore perifoveal and peripheral vision in the left eye, should he use once desired.  Risk benefits and booklet reviewed with photos     ICD-10-CM   1. Cystoid macular edema of left eye  H35.352 OCT, Retina - OU - Both Eyes    Color Fundus Photography Optos - OU - Both Eyes    2. History of retinal detachment  Z86.69     3. Aphakia of eye, left  H27.02       1.  OS, stable over time.  Aphakic condition remains.  2.  Chronic inactive CME OS, no therapy ongoing.  3.  Ophthalmic Meds Ordered this visit:  No orders of the defined types were placed in this encounter.      Return in about 1 year (around 08/06/2022) for DILATE OU, COLOR FP, OCT.  There are no Patient Instructions on file for this visit.   Explained the diagnoses, plan, and follow up with the patient and they expressed understanding.  Patient expressed understanding of the importance of proper follow up care.   Clent Demark Bryleigh Ottaway M.D. Diseases & Surgery of the Retina and Vitreous [facility] [place] 08/06/21     Abbreviations: M myopia (nearsighted); A astigmatism; H hyperopia (farsighted); P presbyopia; Mrx spectacle prescription;  CTL contact lenses; OD right  eye; OS left eye; OU both eyes  XT exotropia; ET esotropia; PEK punctate epithelial keratitis; PEE punctate epithelial erosions; DES dry eye syndrome; MGD meibomian gland dysfunction; ATs artificial tears; PFAT's preservative free artificial tears; [place] nuclear sclerotic cataract; PSC posterior subcapsular cataract; ERM epi-retinal membrane; PVD posterior vitreous detachment; RD retinal detachment; DM diabetes mellitus; DR diabetic retinopathy; NPDR non-proliferative diabetic retinopathy; PDR proliferative diabetic retinopathy; CSME clinically significant macular edema; DME diabetic macular edema; dbh dot blot hemorrhages; CWS cotton wool spot; POAG primary open angle glaucoma; C/D cup-to-disc ratio; HVF humphrey visual field; GVF goldmann visual field; OCT optical coherence tomography; IOP intraocular pressure; BRVO Branch retinal vein occlusion; CRVO central retinal vein occlusion; CRAO central retinal artery occlusion; BRAO branch retinal artery occlusion; RT retinal tear; SB scleral buckle; PPV pars plana vitrectomy; VH Vitreous hemorrhage; PRP panretinal laser photocoagulation; IVK intravitreal kenalog; VMT vitreomacular traction; MH Macular hole;  NVD neovascularization of the disc; NVE neovascularization elsewhere; AREDS age related eye disease study; ARMD age related macular degeneration; POAG primary open angle glaucoma; EBMD epithelial/anterior basement membrane dystrophy; ACIOL anterior chamber intraocular lens; IOL intraocular lens; PCIOL posterior chamber intraocular lens; Phaco/IOL phacoemulsification with intraocular lens placement; PRK photorefractive keratectomy; LASIK laser  assisted in situ keratomileusis; HTN hypertension; DM diabetes mellitus; COPD chronic obstructive pulmonary disease

## 2021-08-06 NOTE — Assessment & Plan Note (Signed)
OS, discussed recs restoration of pseudophakic status via Yamane scleral tunnel technique using Zeiss CT [place] type lens.  Patient is aware this surgical option to restore perifoveal and peripheral vision in the left eye, should he use once desired.  Risk benefits and booklet reviewed with photos

## 2021-09-04 ENCOUNTER — Encounter: Payer: Self-pay | Admitting: Gastroenterology

## 2021-11-01 ENCOUNTER — Other Ambulatory Visit: Payer: Self-pay | Admitting: Internal Medicine

## 2021-11-01 DIAGNOSIS — F900 Attention-deficit hyperactivity disorder, predominantly inattentive type: Secondary | ICD-10-CM

## 2021-11-01 MED ORDER — AMPHETAMINE-DEXTROAMPHET ER 20 MG PO CP24: 20.0000 mg | ORAL_CAPSULE | ORAL | 0 refills | Status: DC

## 2021-11-01 MED ORDER — AMPHETAMINE-DEXTROAMPHETAMINE 10 MG PO TABS: 10.0000 mg | ORAL_TABLET | Freq: Every day | ORAL | 0 refills | Status: DC

## 2021-11-01 NOTE — Telephone Encounter (Signed)
Patient called to get refill on amphetamine-dextroamphetamine (ADDERALL XR) 20 MG 24 hr capsule     Please send to  [facility], [place] [address] Phone:  [phone]  Fax:  [phone]           Please advise

## 2021-11-01 NOTE — Telephone Encounter (Signed)
Patient has schedule an appointment for 11/07/21.  Okay to send in 1 refill until appointment?

## 2021-11-07 ENCOUNTER — Telehealth (INDEPENDENT_AMBULATORY_CARE_PROVIDER_SITE_OTHER): Payer: BC Managed Care – PPO | Admitting: Internal Medicine

## 2021-11-07 ENCOUNTER — Encounter: Payer: Self-pay | Admitting: Internal Medicine

## 2021-11-07 DIAGNOSIS — F900 Attention-deficit hyperactivity disorder, predominantly inattentive type: Secondary | ICD-10-CM

## 2021-11-07 MED ORDER — AMPHETAMINE-DEXTROAMPHETAMINE 10 MG PO TABS
10.0000 mg | ORAL_TABLET | Freq: Every day | ORAL | 0 refills | Status: DC
Start: 2021-11-07 — End: 2022-03-19

## 2021-11-07 MED ORDER — AMPHETAMINE-DEXTROAMPHETAMINE 10 MG PO TABS
10.0000 mg | ORAL_TABLET | Freq: Every day | ORAL | 0 refills | Status: DC | PRN
Start: 2021-11-07 — End: 2022-03-19

## 2021-11-07 MED ORDER — AMPHETAMINE-DEXTROAMPHET ER 20 MG PO CP24: 20.0000 mg | ORAL_CAPSULE | Freq: Every day | ORAL | 0 refills | Status: DC

## 2021-11-07 MED ORDER — AMPHETAMINE-DEXTROAMPHET ER 20 MG PO CP24
20.0000 mg | ORAL_CAPSULE | ORAL | 0 refills | Status: DC
Start: 2021-11-07 — End: 2022-03-19

## 2021-11-07 NOTE — Progress Notes (Signed)
Virtual Visit via Video Note  I connected with Alan Tyler on 11/07/21 at  4:00 PM EST by a video enabled telemedicine application and verified that I am speaking with the correct person using two identifiers.  Location patient: home Location provider: work office Persons participating in the virtual visit: patient, provider  I discussed the limitations of evaluation and management by telemedicine and the availability of in person appointments. The patient expressed understanding and agreed to proceed.   HPI: This visit has been scheduled for the purpose of Adderall refills per contract for his ADHD.  He takes XR 20 mg in the morning and an additional 10 mg in the early afternoon.  He has been on this dose for some time and tolerates it well.  Since I last saw him he had a bilateral total knee replacements has done very well and has completed physical therapy.   ROS: Constitutional: Denies fever, chills, diaphoresis, appetite change and fatigue.  HEENT: Denies photophobia, eye pain, redness, hearing loss, ear pain, congestion, sore throat, rhinorrhea, sneezing, mouth sores, trouble swallowing, neck pain, neck stiffness and tinnitus.   Respiratory: Denies SOB, DOE, cough, chest tightness,  and wheezing.   Cardiovascular: Denies chest pain, palpitations and leg swelling.  Gastrointestinal: Denies nausea, vomiting, abdominal pain, diarrhea, constipation, blood in stool and abdominal distention.  Genitourinary: Denies dysuria, urgency, frequency, hematuria, flank pain and difficulty urinating.  Endocrine: Denies: hot or cold intolerance, sweats, changes in hair or nails, polyuria, polydipsia. Musculoskeletal: Denies myalgias, back pain, joint swelling, arthralgias and gait problem.  Skin: Denies pallor, rash and wound.  Neurological: Denies dizziness, seizures, syncope, weakness, light-headedness, numbness and headaches.  Hematological: Denies adenopathy. Easy bruising, personal or  family bleeding history  Psychiatric/Behavioral: Denies suicidal ideation, mood changes, confusion, nervousness, sleep disturbance and agitation   Past Medical History:  Diagnosis Date   Arthritis    Cancer ([place]) 2003   kidney - 2003   Chronic kidney disease    kidney stones and (R) nephrectomy   Colon polyps    Depression    GERD (gastroesophageal reflux disease)    Hyperlipidemia    PVC's (premature ventricular contractions)    arrhythmia    Past Surgical History:  Procedure Laterality Date   CATARACT EXTRACTION     right eye   COLONOSCOPY  2014   NEPHRECTOMY  2003   (R) for cancer tumor   RETINAL DETACHMENT SURGERY  2012    Family History  Problem Relation Age of Onset   Arthritis Mother    Depression Mother    Colon cancer Mother 3       part of colon was removed   Alcohol abuse Father    Heart disease Father    Hyperlipidemia Father    Hyperlipidemia Maternal Grandmother    Hyperlipidemia Maternal Grandfather    Hyperlipidemia Paternal Grandmother    Diabetes Paternal Grandmother    Rectal cancer Paternal Grandmother    Arthritis Paternal Grandfather    Hyperlipidemia Paternal Grandfather    Stomach cancer Neg Hx    Esophageal cancer Neg Hx     SOCIAL HX:   reports that he quit smoking about 19 years ago. His smoking use included cigarettes. He quit smokeless tobacco use about 18 years ago. He reports current alcohol use of about 4.0 standard drinks per week. He reports that he does not use drugs.   Current Outpatient Medications:    buPROPion (WELLBUTRIN XL) 300 MG 24 hr tablet, TAKE 1 TABLET EVERY  MORNING, Disp: 90 tablet, Rfl: 3   Multiple Vitamin (ONE-A-DAY MENS PO), Take 1 tablet by mouth daily., Disp: , Rfl:    OVER THE COUNTER MEDICATION, NATURAL PSYLIUM HUSK FIBER 0.52 grams,(5Capsules) daily, Disp: , Rfl:    amphetamine-dextroamphetamine (ADDERALL XR) 20 MG 24 hr capsule, Take 1 capsule (20 mg total) by mouth daily., Disp: 30 capsule, Rfl: 0    amphetamine-dextroamphetamine (ADDERALL XR) 20 MG 24 hr capsule, Take 1 capsule (20 mg total) by mouth daily., Disp: 30 capsule, Rfl: 0   amphetamine-dextroamphetamine (ADDERALL XR) 20 MG 24 hr capsule, Take 1 capsule (20 mg total) by mouth every morning., Disp: 30 capsule, Rfl: 0   amphetamine-dextroamphetamine (ADDERALL) 10 MG tablet, Take 1 tablet (10 mg total) by mouth daily as needed., Disp: 30 tablet, Rfl: 0   amphetamine-dextroamphetamine (ADDERALL) 10 MG tablet, Take 1 tablet (10 mg total) by mouth daily as needed., Disp: 30 tablet, Rfl: 0   amphetamine-dextroamphetamine (ADDERALL) 10 MG tablet, Take 1 tablet (10 mg total) by mouth daily with breakfast., Disp: 30 tablet, Rfl: 0   atorvastatin (LIPITOR) 40 MG tablet, Take 1 tablet (40 mg total) by mouth daily. (Patient not taking: Reported on 11/07/2021), Disp: 90 tablet, Rfl: 3  EXAM:   VITALS per patient if applicable: None reported  GENERAL: alert, oriented, appears well and in no acute distress  HEENT: atraumatic, conjunttiva clear, no obvious abnormalities on inspection of external nose and ears  NECK: normal movements of the head and neck  LUNGS: on inspection no signs of respiratory distress, breathing rate appears normal, no obvious gross increased work of breathing, gasping or wheezing  CV: no obvious cyanosis  MS: moves all visible extremities without noticeable abnormality  PSYCH/NEURO: pleasant and cooperative, no obvious depression or anxiety, speech and thought processing grossly intact  ASSESSMENT AND PLAN:   Attention deficit hyperactivity disorder (ADHD), predominantly inattentive type  -PDMP reviewed, no red flags, overdose risk score is 310.  This score is increased from last visit due to narcotics received around the time of his knee replacement surgery.  He is no longer on narcotics so I suspect that his ORS will decrease with time. -I will refill Adderall XR 20 mg to take 1 tablet daily for total of 30  tablets a month x3 months.  I will also refill Adderall 10 mg to take 1 tablet in the early afternoon for a total of 30 tablets a month x3 months    I discussed the assessment and treatment plan with the patient. The patient was provided an opportunity to ask questions and all were answered. The patient agreed with the plan and demonstrated an understanding of the instructions.   The patient was advised to call back or seek an in-person evaluation if the symptoms worsen or if the condition fails to improve as anticipated.    Lelon Frohlich, MD  [facility]

## 2022-01-09 ENCOUNTER — Encounter (INDEPENDENT_AMBULATORY_CARE_PROVIDER_SITE_OTHER): Payer: BC Managed Care – PPO | Admitting: Ophthalmology

## 2022-03-18 ENCOUNTER — Telehealth: Payer: Self-pay | Admitting: Internal Medicine

## 2022-03-18 NOTE — Telephone Encounter (Signed)
Patient spouse is calling to request a refill for amphetamine-dextroamphetamine (ADDERALL XR) 20 MG 24 hr capsule [[id number]]  and amphetamine-dextroamphetamine (ADDERALL) 10 MG tablet [[id number]]  to be sent to patients pharmacy. ? ?Please advise. ?

## 2022-03-18 NOTE — Telephone Encounter (Signed)
Left message on machine for patient to schedule an office/virtual visit for further refills. ?

## 2022-03-18 NOTE — Telephone Encounter (Signed)
Appointment scheduled 03/19/22 ?

## 2022-03-19 ENCOUNTER — Telehealth (INDEPENDENT_AMBULATORY_CARE_PROVIDER_SITE_OTHER): Payer: BC Managed Care – PPO | Admitting: Internal Medicine

## 2022-03-19 DIAGNOSIS — F900 Attention-deficit hyperactivity disorder, predominantly inattentive type: Secondary | ICD-10-CM

## 2022-03-19 MED ORDER — AMPHETAMINE-DEXTROAMPHET ER 20 MG PO CP24: 20.0000 mg | ORAL_CAPSULE | Freq: Every day | ORAL | 0 refills | Status: DC

## 2022-03-19 MED ORDER — AMPHETAMINE-DEXTROAMPHETAMINE 10 MG PO TABS: 10.0000 mg | ORAL_TABLET | Freq: Every day | ORAL | 0 refills | Status: DC | PRN

## 2022-03-19 MED ORDER — AMPHETAMINE-DEXTROAMPHETAMINE 10 MG PO TABS: 10.0000 mg | ORAL_TABLET | Freq: Every day | ORAL | 0 refills | Status: DC

## 2022-03-19 MED ORDER — AMPHETAMINE-DEXTROAMPHET ER 20 MG PO CP24: 20.0000 mg | ORAL_CAPSULE | ORAL | 0 refills | Status: DC

## 2022-03-19 NOTE — Progress Notes (Signed)
? ? ?Virtual Visit via Video Note ? ?I connected with Alan Tyler on 03/19/22 at 11:30 AM EDT by a video enabled telemedicine application and verified that I am speaking with the correct person using two identifiers. ? ?Location patient: home ?Location provider: work office ?Persons participating in the virtual visit: patient, provider ? ?I discussed the limitations of evaluation and management by telemedicine and the availability of in person appointments. The patient expressed understanding and agreed to proceed. ? ? ?HPI: ?This visit has been scheduled for the purpose of medication refills.  He has been diagnosed with ADHD and is on Adderall XR 20 mg in the morning with an additional 10 mg of immediate release in the early afternoon.  Has been on the same dose for some time and tolerating well. ? ? ?ROS: ?Constitutional: Denies fever, chills, diaphoresis, appetite change and fatigue.  ?HEENT: Denies photophobia, eye pain, redness, hearing loss, ear pain, congestion, sore throat, rhinorrhea, sneezing, mouth sores, trouble swallowing, neck pain, neck stiffness and tinnitus.   ?Respiratory: Denies SOB, DOE, cough, chest tightness,  and wheezing.   ?Cardiovascular: Denies chest pain, palpitations and leg swelling.  ?Gastrointestinal: Denies nausea, vomiting, abdominal pain, diarrhea, constipation, blood in stool and abdominal distention.  ?Genitourinary: Denies dysuria, urgency, frequency, hematuria, flank pain and difficulty urinating.  ?Endocrine: Denies: hot or cold intolerance, sweats, changes in hair or nails, polyuria, polydipsia. ?Musculoskeletal: Denies myalgias, back pain, joint swelling, arthralgias and gait problem.  ?Skin: Denies pallor, rash and wound.  ?Neurological: Denies dizziness, seizures, syncope, weakness, light-headedness, numbness and headaches.  ?Hematological: Denies adenopathy. Easy bruising, personal or family bleeding history  ?Psychiatric/Behavioral: Denies suicidal ideation, mood  changes, confusion, nervousness, sleep disturbance and agitation ? ? ?Past Medical History:  ?Diagnosis Date  ? Arthritis   ? Cancer [facility]) 2003  ? kidney - 2003  ? Chronic kidney disease   ? kidney stones and (R) nephrectomy  ? Colon polyps   ? Depression   ? GERD (gastroesophageal reflux disease)   ? Hyperlipidemia   ? PVC's (premature ventricular contractions)   ? arrhythmia  ? ? ?Past Surgical History:  ?Procedure Laterality Date  ? CATARACT EXTRACTION    ? right eye  ? COLONOSCOPY  2014  ? NEPHRECTOMY  2003  ? (R) for cancer tumor  ? RETINAL DETACHMENT SURGERY  2012  ? ? ?Family History  ?Problem Relation Age of Onset  ? Arthritis Mother   ? Depression Mother   ? Colon cancer Mother 62  ?     part of colon was removed  ? Alcohol abuse Father   ? Heart disease Father   ? Hyperlipidemia Father   ? Hyperlipidemia Maternal Grandmother   ? Hyperlipidemia Maternal Grandfather   ? Hyperlipidemia Paternal Grandmother   ? Diabetes Paternal Grandmother   ? Rectal cancer Paternal Grandmother   ? Arthritis Paternal Grandfather   ? Hyperlipidemia Paternal Grandfather   ? Stomach cancer Neg Hx   ? Esophageal cancer Neg Hx   ? ? ?SOCIAL HX:  ? reports that he quit smoking about 20 years ago. His smoking use included cigarettes. He quit smokeless tobacco use about 19 years ago. He reports current alcohol use of about 4.0 standard drinks per week. He reports that he does not use drugs. ? ? ?Current Outpatient Medications:  ?  buPROPion (WELLBUTRIN XL) 300 MG 24 hr tablet, TAKE 1 TABLET EVERY MORNING, Disp: 90 tablet, Rfl: 3 ?  Multiple Vitamin (ONE-A-DAY MENS PO), Take 1 tablet by mouth  daily., Disp: , Rfl:  ?  OVER THE COUNTER MEDICATION, NATURAL PSYLIUM HUSK FIBER 0.52 grams,(5Capsules) daily, Disp: , Rfl:  ?  amphetamine-dextroamphetamine (ADDERALL XR) 20 MG 24 hr capsule, Take 1 capsule (20 mg total) by mouth daily., Disp: 30 capsule, Rfl: 0 ?  amphetamine-dextroamphetamine (ADDERALL XR) 20 MG 24 hr capsule, Take 1 capsule  (20 mg total) by mouth daily., Disp: 30 capsule, Rfl: 0 ?  amphetamine-dextroamphetamine (ADDERALL XR) 20 MG 24 hr capsule, Take 1 capsule (20 mg total) by mouth every morning., Disp: 30 capsule, Rfl: 0 ?  amphetamine-dextroamphetamine (ADDERALL) 10 MG tablet, Take 1 tablet (10 mg total) by mouth daily as needed., Disp: 30 tablet, Rfl: 0 ?  amphetamine-dextroamphetamine (ADDERALL) 10 MG tablet, Take 1 tablet (10 mg total) by mouth daily as needed., Disp: 30 tablet, Rfl: 0 ?  amphetamine-dextroamphetamine (ADDERALL) 10 MG tablet, Take 1 tablet (10 mg total) by mouth daily with breakfast., Disp: 30 tablet, Rfl: 0 ?  atorvastatin (LIPITOR) 40 MG tablet, Take 1 tablet (40 mg total) by mouth daily. (Patient not taking: Reported on 11/07/2021), Disp: 90 tablet, Rfl: 3 ? ?EXAM:  ? ?VITALS per patient if applicable: None reported ? ?GENERAL: alert, oriented, appears well and in no acute distress ? ?HEENT: atraumatic, conjunttiva clear, no obvious abnormalities on inspection of external nose and ears ? ?NECK: normal movements of the head and neck ? ?LUNGS: on inspection no signs of respiratory distress, breathing rate appears normal, no obvious gross increased work of breathing, gasping or wheezing ? ?CV: no obvious cyanosis ? ?MS: moves all visible extremities without noticeable abnormality ? ?PSYCH/NEURO: pleasant and cooperative, no obvious depression or anxiety, speech and thought processing grossly intact ? ?ASSESSMENT AND PLAN: ? ? ?Attention deficit hyperactivity disorder (ADHD), predominantly inattentive type ?-PDMP reviewed, no red flags, overdose risk score is 310. ?-Refill Adderall XR 20 mg to take 1 tablet daily for total of 30 tablets a month x3 months, refill Adderall 10 mg to take 1 tablet in the early afternoon for total of 30 tablets a month x3 months. ? ?  ?I discussed the assessment and treatment plan with the patient. The patient was provided an opportunity to ask questions and all were answered. The  patient agreed with the plan and demonstrated an understanding of the instructions. ?  ?The patient was advised to call back or seek an in-person evaluation if the symptoms worsen or if the condition fails to improve as anticipated. ? ? ? ?Lelon Frohlich, MD  ?[facility] ? ?

## 2022-04-01 ENCOUNTER — Encounter: Payer: Self-pay | Admitting: Gastroenterology

## 2022-04-03 ENCOUNTER — Encounter: Payer: Self-pay | Admitting: Internal Medicine

## 2022-04-03 ENCOUNTER — Ambulatory Visit (INDEPENDENT_AMBULATORY_CARE_PROVIDER_SITE_OTHER): Payer: BC Managed Care – PPO | Admitting: Internal Medicine

## 2022-04-03 VITALS — BP 132/80 | HR 85 | Ht 67.0 in | Wt 251.4 lb

## 2022-04-03 DIAGNOSIS — E782 Mixed hyperlipidemia: Secondary | ICD-10-CM

## 2022-04-03 DIAGNOSIS — I2584 Coronary atherosclerosis due to calcified coronary lesion: Secondary | ICD-10-CM | POA: Diagnosis not present

## 2022-04-03 DIAGNOSIS — I2510 Atherosclerotic heart disease of native coronary artery without angina pectoris: Secondary | ICD-10-CM | POA: Diagnosis not present

## 2022-04-03 DIAGNOSIS — I77810 Thoracic aortic ectasia: Secondary | ICD-10-CM

## 2022-04-03 NOTE — Patient Instructions (Signed)
Medication Instructions:  ?Your physician recommends that you continue on your current medications as directed. Please refer to the Current Medication list given to you today. ? ?*If you need a refill on your cardiac medications before your next appointment, please call your pharmacy* ? ? ?Lab Work: ?Lipid Panel, CMET today  ? ?If you have labs (blood work) drawn today and your tests are completely normal, you will receive your results only by: ?MyChart Message (if you have MyChart) OR ?A paper copy in the mail ?If you have any lab test that is abnormal or we need to change your treatment, we will call you to review the results. ? ? ?Testing/Procedures: ?CT angio test/aorta at [facility] ? ? ?Follow-Up: ?At [facility], you and your health needs are our priority.  As part of our continuing mission to provide you with exceptional heart care, we have created designated Provider Care Teams.  These Care Teams include your primary Cardiologist (physician) and Advanced Practice Providers (APPs -  Physician Assistants and Nurse Practitioners) who all work together to provide you with the care you need, when you need it. ? ?We recommend signing up for the patient portal called "MyChart".  Sign up information is provided on this After Visit Summary.  MyChart is used to connect with patients for Virtual Visits (Telemedicine).  Patients are able to view lab/test results, encounter notes, upcoming appointments, etc.  Non-urgent messages can be sent to your provider as well.   ?To learn more about what you can do with MyChart, go to [email or web address].   ? ?Your next appointment:   ?6 month(s) ? ?The format for your next appointment:   ?In Person ? ?Provider:   ?Pixie Casino, MD { ? ?CALL in May to schedule for October 2023 ?

## 2022-04-03 NOTE — Progress Notes (Addendum)
? ? ?OFFICE CONSULT NOTE ? ?Chief Complaint:  ?Chest pain ? ?Primary Care Physician: ?Isaac Bliss, Rayford Halsted, MD ? ?HPI:  ?Alan Tyler is a 66 y.o. male who is being seen today for the evaluation of chest pain at the request of Isaac Bliss, [place]*. This is a very pleasant 66 year old male who works at the [employer].  He has a history of chest pain in the past been evaluated once in 2009 with stress testing in [place] and he previously saw Dr. Peter Martinique in 2014.  At the time he was having some PVCs which were noted when he donated blood.  He underwent an exercise stress echocardiogram which was negative for ischemia.  He had also had some atypical sounding chest pain.  He does have heart disease in his family coding his father who had premature onset coronary disease.  Recently he was working and noticed he had some left-sided chest pain as well as numbness and tingling in his left arm which was a new finding.  I lasted for 15 to 20 minutes.  Subsequently he was seen in the emergency department.  EKG showed no ischemic changes.  He had negative troponins.  He was asked to follow-up with cardiology for further testing.  He denies any recurrent anginal symptoms since then.  He does have some degree of dyslipidemia with total cholesterol in March at 199, HDL 39, LDL 137 and triglycerides 112.  This was addressed with his primary care provider and the plan was to work on diet and lifestyle modification and repeat his lipids this summer.  No history of hypertension or diabetes and is not currently on any medications other than some bupropion and Adderall. ? ?04/03/2022 ? ?Mr. Forget returns today for follow-up.  He had undergone CT calcium scoring which showed a calcium score of 123, 61st percentile for age and sex matched controls.  This also showed a dilated aorta at 40 mm.  This was in May 2022.  I advised starting atorvastatin but he said he took it for about a month or so and had  significant myalgias.  Subsequently he stopped the medicine.  He then had issues with both of his knees and require knee replacements.  Unfortunately he also said that his son died unexpectedly last month.  He remains asymptomatic denying chest pain or shortness of breath. ? ?PMHx:  ?Past Medical History:  ?Diagnosis Date  ? Arthritis   ? Cancer [facility]) 2003  ? kidney - 2003  ? Chronic kidney disease   ? kidney stones and (R) nephrectomy  ? Colon polyps   ? Depression   ? GERD (gastroesophageal reflux disease)   ? Hyperlipidemia   ? PVC's (premature ventricular contractions)   ? arrhythmia  ? ? ?Past Surgical History:  ?Procedure Laterality Date  ? CATARACT EXTRACTION    ? right eye  ? COLONOSCOPY  2014  ? NEPHRECTOMY  2003  ? (R) for cancer tumor  ? RETINAL DETACHMENT SURGERY  2012  ? ? ?FAMHx:  ?Family History  ?Problem Relation Age of Onset  ? Arthritis Mother   ? Depression Mother   ? Colon cancer Mother 44  ?     part of colon was removed  ? Alcohol abuse Father   ? Heart disease Father   ? Hyperlipidemia Father   ? Hyperlipidemia Maternal Grandmother   ? Hyperlipidemia Maternal Grandfather   ? Hyperlipidemia Paternal Grandmother   ? Diabetes Paternal Grandmother   ? Rectal cancer Paternal Grandmother   ?  Arthritis Paternal Grandfather   ? Hyperlipidemia Paternal Grandfather   ? Stomach cancer Neg Hx   ? Esophageal cancer Neg Hx   ? ? ?SOCHx:  ? reports that he quit smoking about 20 years ago. His smoking use included cigarettes. He quit smokeless tobacco use about 19 years ago. He reports current alcohol use of about 4.0 standard drinks per week. He reports that he does not use drugs. ? ?ALLERGIES:  ?Allergies  ?Allergen Reactions  ? Sulfa Drugs Cross Reactors   ?  Per pt: unknown  ? ? ?ROS: ?Pertinent items noted in HPI and remainder of comprehensive ROS otherwise negative. ? ?HOME MEDS: ?Current Outpatient Medications on File Prior to Visit  ?Medication Sig Dispense Refill  ? amphetamine-dextroamphetamine  (ADDERALL XR) 20 MG 24 hr capsule Take 1 capsule (20 mg total) by mouth daily. 30 capsule 0  ? amphetamine-dextroamphetamine (ADDERALL) 10 MG tablet Take 1 tablet (10 mg total) by mouth daily as needed. 30 tablet 0  ? buPROPion (WELLBUTRIN XL) 300 MG 24 hr tablet TAKE 1 TABLET EVERY MORNING 90 tablet 3  ? Multiple Vitamin (ONE-A-DAY MENS PO) Take 1 tablet by mouth daily.    ? OVER THE COUNTER MEDICATION NATURAL PSYLIUM HUSK FIBER 0.52 grams,(5Capsules) daily    ? atorvastatin (LIPITOR) 40 MG tablet Take 1 tablet (40 mg total) by mouth daily. (Patient not taking: Reported on 04/03/2022) 90 tablet 3  ? ?No current facility-administered medications on file prior to visit.  ? ? ?LABS/IMAGING: ?No results found for this or any previous visit (from the past 48 hour(s)). ?No results found. ? ?LIPID PANEL: ?   ?Component Value Date/Time  ? CHOL 199 03/26/2021 0951  ? TRIG 112.0 03/26/2021 0951  ? HDL 39.20 03/26/2021 0951  ? CHOLHDL 5 03/26/2021 0951  ? VLDL 22.4 03/26/2021 0951  ? [place] 137 (H) 03/26/2021 1194  ? LDLDIRECT 163.2 11/29/2012 0805  ? ? ?WEIGHTS: ?Wt Readings from Last 3 Encounters:  ?04/03/22 251 lb 6.4 oz (114 kg)  ?11/07/21 240 lb (108.9 kg)  ?07/17/21 226 lb (102.5 kg)  ? ? ?VITALS: ?BP 132/80   Pulse 85   Ht '5\' 7"'$  (1.702 m)   Wt 251 lb 6.4 oz (114 kg)   SpO2 99%   BMI 39.37 kg/m?  ? ?EXAM: ?General appearance: alert and no distress ?Neck: no carotid bruit, no JVD and thyroid not enlarged, symmetric, no tenderness/mass/nodules ?Lungs: clear to auscultation bilaterally ?Heart: regular rate and rhythm ?Abdomen: soft, non-tender; bowel sounds normal; no masses,  no organomegaly ?Extremities: extremities normal, atraumatic, no cyanosis or edema ?Pulses: 2+ and symmetric ?Skin: Skin color, texture, turgor normal. No rashes or lesions ?Neurologic: Grossly normal ?Psych: Pleasant ? ?EKG: ?Sinus with PVCs 85-personally reviewed ? ?ASSESSMENT: ?Atypical chest pain -low risk Myoview (04/2021) ?CAC score 123,  61st percentile for age and sex matched controls (04/2022) ?Dilated ascending aorta to 40 mm ?Strong family history of premature coronary disease ?Mixed dyslipidemia ? ?PLAN: ?1.   Mr. Welton does have coronary calcification.  His cholesterol has remained above target.  He has not had recent lipids drawn and could not tolerate atorvastatin due to myalgias.  Will repeat a lipid profile and consider therapeutic options based on that.  He is also due for repeat CT of the aorta which would be scheduled in May 2023. Follow-up with me in 6 months. ? ?Pixie Casino, MD, [facility], FACP  ?[place]  ?Medical Director of the Advanced Lipid Disorders &  ?Cardiovascular  Risk Reduction Clinic ?Diplomate of the [employer] of Clinical Lipidology ?Attending Cardiologist  ?Direct Dial: [phone]  Fax: [phone]  ?Website:  [email or web address] ? ?Nadean Corwin Jaycee Pelzer ?04/03/2022, 8:27 AM ?

## 2022-04-09 ENCOUNTER — Other Ambulatory Visit: Payer: Self-pay | Admitting: *Deleted

## 2022-04-09 DIAGNOSIS — E782 Mixed hyperlipidemia: Secondary | ICD-10-CM

## 2022-04-09 MED ORDER — ROSUVASTATIN CALCIUM 20 MG PO TABS: 20.0000 mg | ORAL_TABLET | Freq: Every day | ORAL | 3 refills | Status: DC

## 2022-04-16 LAB — COMPREHENSIVE METABOLIC PANEL
ALT: 16 IU/L (ref 0–44)
AST: 17 IU/L (ref 0–40)
Albumin/Globulin Ratio: 1.8 (ref 1.2–2.2)
Albumin: 4.3 g/dL (ref 3.8–4.8)
Alkaline Phosphatase: 79 IU/L (ref 44–121)
BUN/Creatinine Ratio: 10 (ref 10–24)
BUN: 12 mg/dL (ref 8–27)
Bilirubin Total: 0.4 mg/dL (ref 0.0–1.2)
Calcium: 9 mg/dL (ref 8.6–10.2)
Chloride: 104 mmol/L (ref 96–106)
Creatinine, Ser: 1.21 mg/dL (ref 0.76–1.27)
Globulin, Total: 2.4 g/dL (ref 1.5–4.5)
Glucose: 105 mg/dL — ABNORMAL HIGH (ref 70–99)
Potassium: 4.3 mmol/L (ref 3.5–5.2)
Sodium: 139 mmol/L (ref 134–144)
Total Protein: 6.7 g/dL (ref 6.0–8.5)
eGFR: 66 mL/min/{1.73_m2} (ref >59)

## 2022-04-16 LAB — LIPID PANEL
Chol/HDL Ratio: 5.2 ratio — ABNORMAL HIGH (ref 0.0–5.0)
Cholesterol, Total: 197 mg/dL (ref 100–199)
HDL: 38 mg/dL — ABNORMAL LOW (ref >39)
LDL Chol Calc (NIH): 140 mg/dL — ABNORMAL HIGH (ref 0–99)
Triglycerides: 104 mg/dL (ref 0–149)
VLDL Cholesterol Cal: 19 mg/dL (ref 5–40)

## 2022-04-24 ENCOUNTER — Ambulatory Visit (AMBULATORY_SURGERY_CENTER): Payer: BC Managed Care – PPO | Admitting: *Deleted

## 2022-04-24 VITALS — Ht 67.0 in | Wt 240.0 lb

## 2022-04-24 DIAGNOSIS — Z86010 Personal history of colonic polyps: Secondary | ICD-10-CM

## 2022-04-24 MED ORDER — NA SULFATE-K SULFATE-MG SULF 17.5-3.13-1.6 GM/177ML PO SOLN: 1.0000 | Freq: Once | ORAL | 0 refills | Status: AC

## 2022-04-24 NOTE — Progress Notes (Signed)

## 2022-05-07 ENCOUNTER — Ambulatory Visit
Admission: RE | Admit: 2022-05-07 | Discharge: 2022-05-07 | Disposition: A | Discharge status: Home / Self Care | Payer: BC Managed Care – PPO | Source: Ambulatory Visit | Attending: Internal Medicine | Admitting: Internal Medicine

## 2022-05-07 DIAGNOSIS — I77810 Thoracic aortic ectasia: Secondary | ICD-10-CM

## 2022-05-07 MED ORDER — IOPAMIDOL (ISOVUE-370) INJECTION 76%
75.0000 mL | Freq: Once | INTRAVENOUS | Status: AC | PRN
  Administered 2022-05-07: 75 mL via INTRAVENOUS

## 2022-05-08 ENCOUNTER — Other Ambulatory Visit: Payer: Self-pay | Admitting: *Deleted

## 2022-05-08 DIAGNOSIS — I77810 Thoracic aortic ectasia: Secondary | ICD-10-CM

## 2022-05-08 DIAGNOSIS — R911 Solitary pulmonary nodule: Secondary | ICD-10-CM

## 2022-05-12 ENCOUNTER — Other Ambulatory Visit: Payer: Self-pay | Admitting: Internal Medicine

## 2022-05-12 DIAGNOSIS — F900 Attention-deficit hyperactivity disorder, predominantly inattentive type: Secondary | ICD-10-CM

## 2022-05-13 MED ORDER — AMPHETAMINE-DEXTROAMPHETAMINE 10 MG PO TABS: 10.0000 mg | ORAL_TABLET | Freq: Every day | ORAL | 0 refills | Status: DC | PRN

## 2022-05-13 MED ORDER — AMPHETAMINE-DEXTROAMPHET ER 20 MG PO CP24: 20.0000 mg | ORAL_CAPSULE | Freq: Every day | ORAL | 0 refills | Status: DC

## 2022-05-26 ENCOUNTER — Encounter: Payer: Self-pay | Admitting: Certified Registered Nurse Anesthetist

## 2022-05-28 ENCOUNTER — Encounter: Payer: Self-pay | Admitting: Gastroenterology

## 2022-06-02 ENCOUNTER — Ambulatory Visit (AMBULATORY_SURGERY_CENTER): Payer: BC Managed Care – PPO | Admitting: Gastroenterology

## 2022-06-02 ENCOUNTER — Encounter: Payer: Self-pay | Admitting: Gastroenterology

## 2022-06-02 VITALS — BP 112/66 | HR 84 | Temp 97.5°F | Resp 25 | Ht 67.0 in | Wt 240.0 lb

## 2022-06-02 DIAGNOSIS — D122 Benign neoplasm of ascending colon: Secondary | ICD-10-CM

## 2022-06-02 DIAGNOSIS — Z86010 Personal history of colonic polyps: Secondary | ICD-10-CM

## 2022-06-02 MED ORDER — SODIUM CHLORIDE 0.9 % IV SOLN: 500.0000 mL | Freq: Once | INTRAVENOUS | Status: DC

## 2022-06-02 NOTE — Patient Instructions (Signed)
Handouts on hemorrhoids, diverticulosis, polyps, and high fiber diet given to patient. Await pathology results. Resume previous diet and continue present medications. Repeat colonoscopy for surveillance will be determined based off of pathology results.   YOU HAD AN ENDOSCOPIC PROCEDURE TODAY AT [facility]:   Refer to the procedure report that was given to you for any specific questions about what was found during the examination.  If the procedure report does not answer your questions, please call your gastroenterologist to clarify.  If you requested that your care partner not be given the details of your procedure findings, then the procedure report has been included in a sealed envelope for you to review at your convenience later.  YOU SHOULD EXPECT: Some feelings of bloating in the abdomen. Passage of more gas than usual.  Walking can help get rid of the air that was put into your GI tract during the procedure and reduce the bloating. If you had a lower endoscopy (such as a colonoscopy or flexible sigmoidoscopy) you may notice spotting of blood in your stool or on the toilet paper. If you underwent a bowel prep for your procedure, you may not have a normal bowel movement for a few days.  Please Note:  You might notice some irritation and congestion in your nose or some drainage.  This is from the oxygen used during your procedure.  There is no need for concern and it should clear up in a day or so.  SYMPTOMS TO REPORT IMMEDIATELY:  Following lower endoscopy (colonoscopy or flexible sigmoidoscopy):  Excessive amounts of blood in the stool  Significant tenderness or worsening of abdominal pains  Swelling of the abdomen that is new, acute  Fever of 100F or higher  For urgent or emergent issues, a gastroenterologist can be reached at any hour by calling [phone]. Do not use MyChart messaging for urgent concerns.    DIET:  We do recommend a small meal at first, but  then you may proceed to your regular diet.  Drink plenty of fluids but you should avoid alcoholic beverages for 24 hours.  ACTIVITY:  You should plan to take it easy for the rest of today and you should NOT DRIVE or use heavy machinery until tomorrow (because of the sedation medicines used during the test).    FOLLOW UP: Our staff will call the number listed on your records 24-72 hours following your procedure to check on you and address any questions or concerns that you may have regarding the information given to you following your procedure. If we do not reach you, we will leave a message.  We will attempt to reach you two times.  During this call, we will ask if you have developed any symptoms of COVID 19. If you develop any symptoms (ie: fever, flu-like symptoms, shortness of breath, cough etc.) before then, please call [phone].  If you test positive for Covid 19 in the 2 weeks post procedure, please call and report this information to [place].    If any biopsies were taken you will be contacted by phone or by letter within the next 1-3 weeks.  Please call us at [phone] if you have not heard about the biopsies in 3 weeks.    SIGNATURES/CONFIDENTIALITY: You and/or your care partner have signed paperwork which will be entered into your electronic medical record.  These signatures attest to the fact that that the information above on your After Visit Summary has been reviewed and is understood.  Full responsibility of the confidentiality of this discharge information lies with you and/or your care-partner.  

## 2022-06-02 NOTE — Progress Notes (Signed)
Pt's states no medical or surgical changes since previsit or office visit. 

## 2022-06-02 NOTE — Progress Notes (Signed)
Called to room to assist during endoscopic procedure.  Patient ID and intended procedure confirmed with present staff. Received instructions for my participation in the procedure from the performing physician.  

## 2022-06-02 NOTE — Progress Notes (Signed)
Report given to PACU, vss 

## 2022-06-02 NOTE — Progress Notes (Signed)
History & Physical  Primary Care Physician:  Isaac Bliss, Rayford Halsted, MD Primary Gastroenterologist: Lucio Edward, MD  CHIEF COMPLAINT:  Personal history of colon polyps   HPI: Alan Tyler is a 66 y.o. male with a personal history of adenomatous and sessile serrated colon polyps on colonoscopy in 2019 for surveillance colonoscopy.   Past Medical History:  Diagnosis Date   Allergy    SEASONAL   Anxiety    Arthritis    Cancer ([place]) 2003   kidney - 2003   Cataract    BILATERAL-REMOVED   Chronic kidney disease    kidney stones and (R) nephrectomy   Colon polyps    Depression    GERD (gastroesophageal reflux disease)    Hyperlipidemia    PVC's (premature ventricular contractions)    arrhythmia    Past Surgical History:  Procedure Laterality Date   CATARACT EXTRACTION     right eye   COLONOSCOPY  2014   NEPHRECTOMY  2003   (R) for cancer tumor   POLYPECTOMY     RETINAL DETACHMENT SURGERY  2012   TOTAL KNEE REPLACEMENTS Bilateral     Prior to Admission medications   Medication Sig Start Date End Date Taking? Authorizing Provider  amphetamine-dextroamphetamine (ADDERALL XR) 20 MG 24 hr capsule Take 1 capsule (20 mg total) by mouth daily. 05/13/22  Yes Erline Hau, MD  amphetamine-dextroamphetamine (ADDERALL) 10 MG tablet Take 1 tablet (10 mg total) by mouth daily as needed. 05/13/22  Yes Isaac Bliss, Rayford Halsted, MD  buPROPion (WELLBUTRIN XL) 300 MG 24 hr tablet TAKE 1 TABLET EVERY MORNING Patient taking differently: daily. 07/09/21  Yes Isaac Bliss, Rayford Halsted, MD  Multiple Vitamin (ONE-A-DAY MENS PO) Take 1 tablet by mouth daily.   Yes [provider]  OVER THE COUNTER MEDICATION NATURAL PSYLIUM HUSK FIBER 0.52 grams,(5Capsules) daily   Yes [provider]  rosuvastatin (CRESTOR) 20 MG tablet Take 1 tablet (20 mg total) by mouth daily. 04/09/22 07/08/22 Yes Hilty, Nadean Corwin, MD    Current Outpatient Medications  Medication Sig  Dispense Refill   amphetamine-dextroamphetamine (ADDERALL XR) 20 MG 24 hr capsule Take 1 capsule (20 mg total) by mouth daily. 30 capsule 0   amphetamine-dextroamphetamine (ADDERALL) 10 MG tablet Take 1 tablet (10 mg total) by mouth daily as needed. 30 tablet 0   buPROPion (WELLBUTRIN XL) 300 MG 24 hr tablet TAKE 1 TABLET EVERY MORNING (Patient taking differently: daily.) 90 tablet 3   Multiple Vitamin (ONE-A-DAY MENS PO) Take 1 tablet by mouth daily.     OVER THE COUNTER MEDICATION NATURAL PSYLIUM HUSK FIBER 0.52 grams,(5Capsules) daily     rosuvastatin (CRESTOR) 20 MG tablet Take 1 tablet (20 mg total) by mouth daily. 90 tablet 3   Current Facility-Administered Medications  Medication Dose Route Frequency Provider Last Rate Last Admin   0.9 %  sodium chloride infusion  500 mL Intravenous Once Ladene Artist, MD        Allergies as of 06/02/2022 - Review Complete 06/02/2022  Allergen Reaction Noted   Atorvastatin Other (See Comments) 04/03/2022   Sulfa drugs cross reactors  07/01/2011    Family History  Problem Relation Age of Onset   Arthritis Mother    Depression Mother    Colon cancer Mother 22       part of colon was removed   Alcohol abuse Father    Heart disease Father    Hyperlipidemia Father    Hyperlipidemia Maternal Grandmother  Hyperlipidemia Maternal Grandfather    Hyperlipidemia Paternal Grandmother    Diabetes Paternal Grandmother    Rectal cancer Paternal Grandmother    Arthritis Paternal Grandfather    Hyperlipidemia Paternal Grandfather    Stomach cancer Neg Hx    Esophageal cancer Neg Hx    Crohn's disease Neg Hx     Social History   Socioeconomic History   Marital status: Married    Spouse name: Not on file   Number of children: 3   Years of education: Not on file   Highest education level: Not on file  Occupational History   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  Tobacco Use   Smoking status: Former    Types:  Cigarettes    Quit date: 12/29/2001    Years since quitting: 20.4   Smokeless tobacco: Former    Quit date: 12/27/2002  Vaping Use   Vaping Use: Never used  Substance and Sexual Activity   Alcohol use: Not Currently    Alcohol/week: 4.0 standard drinks    Types: 4 Standard drinks or equivalent per week   Drug use: No   Sexual activity: Not on file  Other Topics Concern   Not on file  Social History Narrative   Not on file   Social Determinants of Health   Financial Resource Strain: Not on file  Food Insecurity: Not on file  Transportation Needs: Not on file  Physical Activity: Not on file  Stress: Not on file  Social Connections: Not on file  Intimate Partner Violence: Not on file    Review of Systems:  All systems reviewed an negative except where noted in HPI.  Gen: Denies any fever, chills, sweats, anorexia, fatigue, weakness, malaise, weight loss, and sleep disorder CV: Denies chest pain, angina, palpitations, syncope, orthopnea, PND, peripheral edema, and claudication. Resp: Denies dyspnea at rest, dyspnea with exercise, cough, sputum, wheezing, coughing up blood, and pleurisy. GI: Denies vomiting blood, jaundice, and fecal incontinence.   Denies dysphagia or odynophagia. GU : Denies urinary burning, blood in urine, urinary frequency, urinary hesitancy, nocturnal urination, and urinary incontinence. MS: Denies joint pain, limitation of movement, and swelling, stiffness, low back pain, extremity pain. Denies muscle weakness, cramps, atrophy.  Derm: Denies rash, itching, dry skin, hives, moles, warts, or unhealing ulcers.  Psych: Denies depression, anxiety, memory loss, suicidal ideation, hallucinations, paranoia, and confusion. Heme: Denies bruising, bleeding, and enlarged lymph nodes. Neuro:  Denies any headaches, dizziness, paresthesias. Endo:  Denies any problems with DM, thyroid, adrenal function.   Physical Exam: General:  Alert, well-developed, in NAD Head:   Normocephalic and atraumatic. Eyes:  Sclera clear, no icterus.   Conjunctiva pink. Ears:  Normal auditory acuity. Mouth:  No deformity or lesions.  Neck:  Supple; no masses . Lungs:  Clear throughout to auscultation.   No wheezes, crackles, or rhonchi. No acute distress. Heart:  Regular rate and rhythm; no murmurs. Abdomen:  Soft, nondistended, nontender. No masses, hepatomegaly. No obvious masses.  Normal bowel .    Rectal:  Deferred   Msk:  Symmetrical without gross deformities.. Pulses:  Normal pulses noted. Extremities:  Without edema. Neurologic:  Alert and  oriented x4;  grossly normal neurologically. Skin:  Intact without significant lesions or rashes. Cervical Nodes:  No significant cervical adenopathy. Psych:  Alert and cooperative. Normal mood and affect.  Impression / Plan:   Personal history of adenomatous and sessile serrated colon polyps on colonoscopy in 2019 for surveillance colonoscopy.  Pricilla Riffle. Fuller Plan  06/02/2022, 1:39 PM See [facility], [facility], to contact our on call provider

## 2022-06-02 NOTE — Op Note (Signed)
[facility] Patient Name: Alan Tyler Procedure Date: 06/02/2022 1:29 PM MRN: [id number] Endoscopist: Ladene Artist , MD Age: 66 Referring MD:  Date of Birth: 17-Nov-1956 Gender: Male Account #: [id number] Procedure:                Colonoscopy Indications:              Surveillance: Personal history of adenomatous and                            sessile serrated polyps on last colonoscopy > 3                            years ago Medicines:                Monitored Anesthesia Care Procedure:                Pre-Anesthesia Assessment:                           - Prior to the procedure, a History and Physical                            was performed, and patient medications and                            allergies were reviewed. The patient's tolerance of                            previous anesthesia was also reviewed. The risks                            and benefits of the procedure and the sedation                            options and risks were discussed with the patient.                            All questions were answered, and informed consent                            was obtained. Prior Anticoagulants: The patient has                            taken no previous anticoagulant or antiplatelet                            agents. ASA Grade Assessment: II - A patient with                            mild systemic disease. After reviewing the risks                            and benefits, the patient was deemed in  satisfactory condition to undergo the procedure.                           After obtaining informed consent, the colonoscope                            was passed under direct vision. Throughout the                            procedure, the patient's blood pressure, pulse, and                            oxygen saturations were monitored continuously. The                            Olympus CF-HQ190L [id number]) Colonoscope was                             introduced through the anus and advanced to the the                            cecum, identified by appendiceal orifice and                            ileocecal valve. The ileocecal valve, appendiceal                            orifice, and rectum were photographed. The quality                            of the bowel preparation was good after substantial                            lavage, suction. The colonoscopy was performed                            without difficulty. The patient tolerated the                            procedure well. Scope In: 1:43:56 PM Scope Out: 2:00:57 PM Scope Withdrawal Time: 0 hours 12 minutes 39 seconds  Total Procedure Duration: 0 hours 17 minutes 1 second  Findings:                 The perianal and digital rectal examinations were                            normal.                           A 7 mm polyp was found in the ascending colon. The                            polyp was sessile. The polyp was removed with a  cold snare. Resection and retrieval were complete.                           A few small-mouthed diverticula were found in the                            left colon. There was no evidence of diverticular                            bleeding.                           External hemorrhoids were found during                            retroflexion. The hemorrhoids were small.                           The exam was otherwise without abnormality on                            direct and retroflexion views. Complications:            No immediate complications. Estimated blood loss:                            None. Estimated Blood Loss:     Estimated blood loss: none. Impression:               - One 7 mm polyp in the ascending colon, removed                            with a cold snare. Resected and retrieved.                           - Mild diverticulosis in the left colon.                           -  Small external hemorrhoids.                           - The examination was otherwise normal on direct                            and retroflexion views. Recommendation:           - Repeat colonoscopy after studies are complete for                            surveillance based on pathology results.                           - Patient has a contact number available for                            emergencies. The signs and symptoms of potential  delayed complications were discussed with the                            patient. Return to normal activities tomorrow.                            Written discharge instructions were provided to the                            patient.                           - High fiber diet.                           - Continue present medications.                           - Await pathology results. Ladene Artist, MD 06/02/2022 2:04:45 PM This report has been signed electronically.

## 2022-06-03 ENCOUNTER — Telehealth: Payer: Self-pay | Admitting: *Deleted

## 2022-06-03 ENCOUNTER — Telehealth: Payer: Self-pay

## 2022-06-03 NOTE — Telephone Encounter (Signed)
Called # [phone] and left a message we tried to reach pt for a follow up call. maw

## 2022-06-03 NOTE — Telephone Encounter (Signed)
  Follow up Call-     06/02/2022    1:02 PM  Call back number  Post procedure Call Back phone  # [phone]  Permission to leave phone message Yes     Patient questions:  Do you have a fever, pain , or abdominal swelling? No. Pain Score  0 *  Have you tolerated food without any problems? Yes.    Have you been able to return to your normal activities? Yes.    Do you have any questions about your discharge instructions: Diet   No. Medications  No. Follow up visit  No.  Do you have questions or concerns about your Care? No.  Actions: * If pain score is 4 or above: No action needed, pain <4.

## 2022-06-10 ENCOUNTER — Encounter: Payer: Self-pay | Admitting: Gastroenterology

## 2022-07-01 ENCOUNTER — Other Ambulatory Visit: Payer: Self-pay | Admitting: Internal Medicine

## 2022-07-02 NOTE — Telephone Encounter (Signed)
Patient need to schedule an CPE for more refills. 

## 2022-07-08 ENCOUNTER — Encounter: Payer: Self-pay | Admitting: Internal Medicine

## 2022-07-08 MED ORDER — BUPROPION HCL ER (XL) 300 MG PO TB24: 300.0000 mg | ORAL_TABLET | Freq: Every morning | ORAL | 0 refills | Status: DC

## 2022-07-23 ENCOUNTER — Encounter: Payer: Self-pay | Admitting: Internal Medicine

## 2022-07-23 ENCOUNTER — Ambulatory Visit (INDEPENDENT_AMBULATORY_CARE_PROVIDER_SITE_OTHER): Payer: BC Managed Care – PPO | Admitting: Internal Medicine

## 2022-07-23 VITALS — BP 120/84 | HR 85 | Temp 97.8°F | Ht 67.0 in | Wt 230.6 lb

## 2022-07-23 DIAGNOSIS — Z23 Encounter for immunization: Secondary | ICD-10-CM

## 2022-07-23 DIAGNOSIS — F320 Major depressive disorder, single episode, mild: Secondary | ICD-10-CM | POA: Diagnosis not present

## 2022-07-23 DIAGNOSIS — F900 Attention-deficit hyperactivity disorder, predominantly inattentive type: Secondary | ICD-10-CM

## 2022-07-23 DIAGNOSIS — Z0000 Encounter for general adult medical examination without abnormal findings: Secondary | ICD-10-CM

## 2022-07-23 DIAGNOSIS — E559 Vitamin D deficiency, unspecified: Secondary | ICD-10-CM | POA: Diagnosis not present

## 2022-07-23 DIAGNOSIS — E782 Mixed hyperlipidemia: Secondary | ICD-10-CM

## 2022-07-23 DIAGNOSIS — E538 Deficiency of other specified B group vitamins: Secondary | ICD-10-CM | POA: Diagnosis not present

## 2022-07-23 LAB — CBC WITH DIFFERENTIAL/PLATELET
Basophils Absolute: 0 10*3/uL (ref 0.0–0.1)
Basophils Relative: 0.4 % (ref 0.0–3.0)
Eosinophils Absolute: 0.2 10*3/uL (ref 0.0–0.7)
Eosinophils Relative: 2.1 % (ref 0.0–5.0)
HCT: 44.7 % (ref 39.0–52.0)
Hemoglobin: 15.3 g/dL (ref 13.0–17.0)
Lymphocytes Relative: 19.9 % (ref 12.0–46.0)
Lymphs Abs: 1.8 10*3/uL (ref 0.7–4.0)
MCHC: 34.3 g/dL (ref 30.0–36.0)
MCV: 88 fl (ref 78.0–100.0)
Monocytes Absolute: 0.7 10*3/uL (ref 0.1–1.0)
Monocytes Relative: 7.2 % (ref 3.0–12.0)
Neutro Abs: 6.5 10*3/uL (ref 1.4–7.7)
Neutrophils Relative %: 70.4 % (ref 43.0–77.0)
Platelets: 181 10*3/uL (ref 150.0–400.0)
RBC: 5.08 Mil/uL (ref 4.22–5.81)
RDW: 13.8 % (ref 11.5–15.5)
WBC: 9.3 10*3/uL (ref 4.0–10.5)

## 2022-07-23 LAB — COMPREHENSIVE METABOLIC PANEL
ALT: 11 U/L (ref 0–53)
AST: 13 U/L (ref 0–37)
Albumin: 4.4 g/dL (ref 3.5–5.2)
Alkaline Phosphatase: 71 U/L (ref 39–117)
BUN: 19 mg/dL (ref 6–23)
CO2: 25 mEq/L (ref 19–32)
Calcium: 9.1 mg/dL (ref 8.4–10.5)
Chloride: 103 mEq/L (ref 96–112)
Creatinine, Ser: 1.18 mg/dL (ref 0.40–1.50)
GFR: 64.76 mL/min (ref >60.00)
Glucose, Bld: 87 mg/dL (ref 70–99)
Potassium: 4.4 mEq/L (ref 3.5–5.1)
Sodium: 136 mEq/L (ref 135–145)
Total Bilirubin: 1 mg/dL (ref 0.2–1.2)
Total Protein: 7 g/dL (ref 6.0–8.3)

## 2022-07-23 LAB — PSA: PSA: 1.45 ng/mL (ref 0.10–4.00)

## 2022-07-23 LAB — LIPID PANEL
Cholesterol: 118 mg/dL (ref 0–200)
HDL: 38.7 mg/dL — ABNORMAL LOW (ref >39.00)
LDL Cholesterol: 63 mg/dL (ref 0–99)
NonHDL: 79.06
Total CHOL/HDL Ratio: 3
Triglycerides: 78 mg/dL (ref 0.0–149.0)
VLDL: 15.6 mg/dL (ref 0.0–40.0)

## 2022-07-23 LAB — TSH: TSH: 1.59 u[IU]/mL (ref 0.35–5.50)

## 2022-07-23 LAB — VITAMIN B12: Vitamin B-12: 194 pg/mL — ABNORMAL LOW (ref 211–911)

## 2022-07-23 LAB — HEMOGLOBIN A1C: Hgb A1c MFr Bld: 5.4 % (ref 4.6–6.5)

## 2022-07-23 LAB — VITAMIN D 25 HYDROXY (VIT D DEFICIENCY, FRACTURES): VITD: 35.01 ng/mL (ref 30.00–100.00)

## 2022-07-23 MED ORDER — AMPHETAMINE-DEXTROAMPHETAMINE 10 MG PO TABS: 10.0000 mg | ORAL_TABLET | Freq: Every day | ORAL | 0 refills | Status: DC

## 2022-07-23 MED ORDER — AMPHETAMINE-DEXTROAMPHETAMINE 10 MG PO TABS: 10.0000 mg | ORAL_TABLET | Freq: Every day | ORAL | 0 refills | Status: DC | PRN

## 2022-07-23 MED ORDER — AMPHETAMINE-DEXTROAMPHET ER 20 MG PO CP24: 20.0000 mg | ORAL_CAPSULE | Freq: Every day | ORAL | 0 refills | Status: DC

## 2022-07-23 NOTE — Patient Instructions (Signed)
-  Nice seeing you today!!  -Lab work today; will notify you once results are available.  -Pneumonia and shingles vaccines today.  -Remember COVID vaccine at your pharmacy.

## 2022-07-23 NOTE — Progress Notes (Deleted)
Established Patient Office Visit     CC/Reason for Visit: ***  HPI: Alan Tyler is a 66 y.o. male who is coming in today for the above mentioned reasons. Past Medical History is significant for: ***   Past Medical/Surgical History: Past Medical History:  Diagnosis Date   Allergy    SEASONAL   Anxiety    Arthritis    Cancer ([place]) 2003   kidney - 2003   Cataract    BILATERAL-REMOVED   Chronic kidney disease    kidney stones and (R) nephrectomy   Colon polyps    Depression    GERD (gastroesophageal reflux disease)    Hyperlipidemia    PVC's (premature ventricular contractions)    arrhythmia    Past Surgical History:  Procedure Laterality Date   CATARACT EXTRACTION     right eye   COLONOSCOPY  2014   NEPHRECTOMY  2003   (R) for cancer tumor   POLYPECTOMY     RETINAL DETACHMENT SURGERY  2012   TOTAL KNEE REPLACEMENTS Bilateral     Social History:  reports that he quit smoking about 20 years ago. His smoking use included cigarettes. He quit smokeless tobacco use about 19 years ago. He reports that he does not currently use alcohol after a past usage of about 4.0 standard drinks of alcohol per week. He reports that he does not use drugs.  Allergies: Allergies  Allergen Reactions   Atorvastatin Other (See Comments)    Myalgia   Sulfa Drugs Cross Reactors     Per pt: unknown    Family History: *** Family History  Problem Relation Age of Onset   Arthritis Mother    Depression Mother    Colon cancer Mother 64       part of colon was removed   Alcohol abuse Father    Heart disease Father    Hyperlipidemia Father    Hyperlipidemia Maternal Grandmother    Hyperlipidemia Maternal Grandfather    Hyperlipidemia Paternal Grandmother    Diabetes Paternal Grandmother    Rectal cancer Paternal Grandmother    Arthritis Paternal Grandfather    Hyperlipidemia Paternal Grandfather    Stomach cancer Neg Hx    Esophageal cancer Neg Hx    Crohn's disease Neg Hx       Current Outpatient Medications:    amphetamine-dextroamphetamine (ADDERALL XR) 20 MG 24 hr capsule, Take 1 capsule (20 mg total) by mouth daily., Disp: 30 capsule, Rfl: 0   amphetamine-dextroamphetamine (ADDERALL XR) 20 MG 24 hr capsule, Take 1 capsule (20 mg total) by mouth daily., Disp: 30 capsule, Rfl: 0   amphetamine-dextroamphetamine (ADDERALL) 10 MG tablet, Take 1 tablet (10 mg total) by mouth daily with breakfast., Disp: 30 tablet, Rfl: 0   amphetamine-dextroamphetamine (ADDERALL) 10 MG tablet, Take 1 tablet (10 mg total) by mouth daily with breakfast., Disp: 30 tablet, Rfl: 0   buPROPion (WELLBUTRIN XL) 300 MG 24 hr tablet, Take 1 tablet (300 mg total) by mouth every morning., Disp: 90 tablet, Rfl: 0   Multiple Vitamin (ONE-A-DAY MENS PO), Take 1 tablet by mouth daily., Disp: , Rfl:    OVER THE COUNTER MEDICATION, NATURAL PSYLIUM HUSK FIBER 0.52 grams,(5Capsules) daily, Disp: , Rfl:    amphetamine-dextroamphetamine (ADDERALL XR) 20 MG 24 hr capsule, Take 1 capsule (20 mg total) by mouth daily., Disp: 30 capsule, Rfl: 0   amphetamine-dextroamphetamine (ADDERALL) 10 MG tablet, Take 1 tablet (10 mg total) by mouth daily as needed., Disp: 30 tablet, Rfl:  0   rosuvastatin (CRESTOR) 20 MG tablet, Take 1 tablet (20 mg total) by mouth daily., Disp: 90 tablet, Rfl: 3  Review of Systems: *** Constitutional: Denies fever, chills, diaphoresis, appetite change and fatigue.  HEENT: Denies photophobia, eye pain, redness, hearing loss, ear pain, congestion, sore throat, rhinorrhea, sneezing, mouth sores, trouble swallowing, neck pain, neck stiffness and tinnitus.   Respiratory: Denies SOB, DOE, cough, chest tightness,  and wheezing.   Cardiovascular: Denies chest pain, palpitations and leg swelling.  Gastrointestinal: Denies nausea, vomiting, abdominal pain, diarrhea, constipation, blood in stool and abdominal distention.  Genitourinary: Denies dysuria, urgency, frequency, hematuria, flank pain  and difficulty urinating.  Endocrine: Denies: hot or cold intolerance, sweats, changes in hair or nails, polyuria, polydipsia. Musculoskeletal: Denies myalgias, back pain, joint swelling, arthralgias and gait problem.  Skin: Denies pallor, rash and wound.  Neurological: Denies dizziness, seizures, syncope, weakness, light-headedness, numbness and headaches.  Hematological: Denies adenopathy. Easy bruising, personal or family bleeding history  Psychiatric/Behavioral: Denies suicidal ideation, mood changes, confusion, nervousness, sleep disturbance and agitation    Physical Exam: Vitals:   07/23/22 1343  BP: 120/84  Pulse: 85  Temp: 97.8 F (36.6 C)  TempSrc: Oral  SpO2: 98%  Weight: 230 lb 9.6 oz (104.6 kg)  Height: '5\' 7"'$  (1.702 m)    Body mass index is 36.12 kg/m.   *** Constitutional: NAD, calm, comfortable Eyes: PERRL, lids and conjunctivae normal ENMT: Mucous membranes are moist. Posterior pharynx clear of any exudate or lesions. Normal dentition. Tympanic membrane is pearly white, no erythema or bulging. Neck: normal, supple, no masses, no thyromegaly Respiratory: clear to auscultation bilaterally, no wheezing, no crackles. Normal respiratory effort. No accessory muscle use.  Cardiovascular: Regular rate and rhythm, no murmurs / rubs / gallops. No extremity edema. 2+ pedal pulses. No carotid bruits.  Abdomen: no tenderness, no masses palpated. No hepatosplenomegaly. Bowel sounds positive.  Musculoskeletal: no clubbing / cyanosis. No joint deformity upper and lower extremities. Good ROM, no contractures. Normal muscle tone.  Skin: no rashes, lesions, ulcers. No induration Neurologic: CN 2-12 grossly intact. Sensation intact, DTR normal. Strength 5/5 in all 4.  Psychiatric: Normal judgment and insight. Alert and oriented x 3. Normal mood.    Impression and Plan:  Encounter for preventive health examination - Plan: PSA  Attention deficit hyperactivity disorder (ADHD),  predominantly inattentive type - Plan: amphetamine-dextroamphetamine (ADDERALL XR) 20 MG 24 hr capsule, amphetamine-dextroamphetamine (ADDERALL) 10 MG tablet, amphetamine-dextroamphetamine (ADDERALL XR) 20 MG 24 hr capsule, amphetamine-dextroamphetamine (ADDERALL XR) 20 MG 24 hr capsule, amphetamine-dextroamphetamine (ADDERALL) 10 MG tablet, amphetamine-dextroamphetamine (ADDERALL) 10 MG tablet  Need for shingles vaccine  Need for vaccination for Strep pneumoniae  Current mild episode of major depressive disorder, unspecified whether recurrent ([place])  Mixed hyperlipidemia - Plan: CBC with Differential/Platelet, Comprehensive metabolic panel, Lipid panel  Vitamin B12 deficiency - Plan: Vitamin B12  Vitamin D deficiency - Plan: VITAMIN D 25 Hydroxy (Vit-D Deficiency, Fractures)  Morbid obesity ([place]) - Plan: Hemoglobin A1c, TSH    Patient Instructions  -Nice seeing you today!!  -Lab work today; will notify you once results are available.  -Pneumonia and shingles vaccines today.  -Remember COVID vaccine at your pharmacy.      Lelon Frohlich, MD [facility]

## 2022-07-23 NOTE — Progress Notes (Signed)
Established Patient Office Visit     CC/Reason for Visit: Annual preventive exam, medication refills, follow-up chronic medical conditions  HPI: Alan Tyler is a 66 y.o. male who is coming in today for the above mentioned reasons. Past Medical History is significant for: ADHD on Adderall, obesity, depression, vitamin D and B12 deficiency.  He is due for Adderall refills today per contract.  He feels well and has no acute concerns or complaints.  He has routine eye and dental care.  He exercises by walking 2-1/2 to 4 miles a day.  He has lost 10 pounds since his last visit.  He had his colonoscopy in June.  He is overdue for bivalent COVID, pneumonia and second shingles vaccines.   Past Medical/Surgical History: Past Medical History:  Diagnosis Date   Allergy    SEASONAL   Anxiety    Arthritis    Cancer ([place]) 2003   kidney - 2003   Cataract    BILATERAL-REMOVED   Chronic kidney disease    kidney stones and (R) nephrectomy   Colon polyps    Depression    GERD (gastroesophageal reflux disease)    Hyperlipidemia    PVC's (premature ventricular contractions)    arrhythmia    Past Surgical History:  Procedure Laterality Date   CATARACT EXTRACTION     right eye   COLONOSCOPY  2014   NEPHRECTOMY  2003   (R) for cancer tumor   POLYPECTOMY     RETINAL DETACHMENT SURGERY  2012   TOTAL KNEE REPLACEMENTS Bilateral     Social History:  reports that he quit smoking about 20 years ago. His smoking use included cigarettes. He quit smokeless tobacco use about 19 years ago. He reports that he does not currently use alcohol after a past usage of about 4.0 standard drinks of alcohol per week. He reports that he does not use drugs.  Allergies: Allergies  Allergen Reactions   Atorvastatin Other (See Comments)    Myalgia   Sulfa Drugs Cross Reactors     Per pt: unknown    Family History:  Family History  Problem Relation Age of Onset   Arthritis Mother    Depression  Mother    Colon cancer Mother 47       part of colon was removed   Alcohol abuse Father    Heart disease Father    Hyperlipidemia Father    Hyperlipidemia Maternal Grandmother    Hyperlipidemia Maternal Grandfather    Hyperlipidemia Paternal Grandmother    Diabetes Paternal Grandmother    Rectal cancer Paternal Grandmother    Arthritis Paternal Grandfather    Hyperlipidemia Paternal Grandfather    Stomach cancer Neg Hx    Esophageal cancer Neg Hx    Crohn's disease Neg Hx      Current Outpatient Medications:    amphetamine-dextroamphetamine (ADDERALL XR) 20 MG 24 hr capsule, Take 1 capsule (20 mg total) by mouth daily., Disp: 30 capsule, Rfl: 0   amphetamine-dextroamphetamine (ADDERALL XR) 20 MG 24 hr capsule, Take 1 capsule (20 mg total) by mouth daily., Disp: 30 capsule, Rfl: 0   amphetamine-dextroamphetamine (ADDERALL) 10 MG tablet, Take 1 tablet (10 mg total) by mouth daily with breakfast., Disp: 30 tablet, Rfl: 0   amphetamine-dextroamphetamine (ADDERALL) 10 MG tablet, Take 1 tablet (10 mg total) by mouth daily with breakfast., Disp: 30 tablet, Rfl: 0   buPROPion (WELLBUTRIN XL) 300 MG 24 hr tablet, Take 1 tablet (300 mg total) by mouth every  morning., Disp: 90 tablet, Rfl: 0   Multiple Vitamin (ONE-A-DAY MENS PO), Take 1 tablet by mouth daily., Disp: , Rfl:    OVER THE COUNTER MEDICATION, NATURAL PSYLIUM HUSK FIBER 0.52 grams,(5Capsules) daily, Disp: , Rfl:    amphetamine-dextroamphetamine (ADDERALL XR) 20 MG 24 hr capsule, Take 1 capsule (20 mg total) by mouth daily., Disp: 30 capsule, Rfl: 0   amphetamine-dextroamphetamine (ADDERALL) 10 MG tablet, Take 1 tablet (10 mg total) by mouth daily as needed., Disp: 30 tablet, Rfl: 0   rosuvastatin (CRESTOR) 20 MG tablet, Take 1 tablet (20 mg total) by mouth daily., Disp: 90 tablet, Rfl: 3  Review of Systems:  Constitutional: Denies fever, chills, diaphoresis, appetite change and fatigue.  HEENT: Denies photophobia, eye pain, redness,  hearing loss, ear pain, congestion, sore throat, rhinorrhea, sneezing, mouth sores, trouble swallowing, neck pain, neck stiffness and tinnitus.   Respiratory: Denies SOB, DOE, cough, chest tightness,  and wheezing.   Cardiovascular: Denies chest pain, palpitations and leg swelling.  Gastrointestinal: Denies nausea, vomiting, abdominal pain, diarrhea, constipation, blood in stool and abdominal distention.  Genitourinary: Denies dysuria, urgency, frequency, hematuria, flank pain and difficulty urinating.  Endocrine: Denies: hot or cold intolerance, sweats, changes in hair or nails, polyuria, polydipsia. Musculoskeletal: Denies myalgias, back pain, joint swelling, arthralgias and gait problem.  Skin: Denies pallor, rash and wound.  Neurological: Denies dizziness, seizures, syncope, weakness, light-headedness, numbness and headaches.  Hematological: Denies adenopathy. Easy bruising, personal or family bleeding history  Psychiatric/Behavioral: Denies suicidal ideation, mood changes, confusion, nervousness, sleep disturbance and agitation    Physical Exam: Vitals:   07/23/22 1343  BP: 120/84  Pulse: 85  Temp: 97.8 F (36.6 C)  TempSrc: Oral  SpO2: 98%  Weight: 230 lb 9.6 oz (104.6 kg)  Height: '5\' 7"'$  (1.702 m)    Body mass index is 36.12 kg/m.   Constitutional: NAD, calm, comfortable Eyes: PERRL, lids and conjunctivae normal, wears corrective lenses ENMT: Mucous membranes are moist. Posterior pharynx clear of any exudate or lesions. Normal dentition. Tympanic membrane is pearly white, no erythema or bulging. Neck: normal, supple, no masses, no thyromegaly Respiratory: clear to auscultation bilaterally, no wheezing, no crackles. Normal respiratory effort. No accessory muscle use.  Cardiovascular: Regular rate and rhythm, no murmurs / rubs / gallops. No extremity edema. 2+ pedal pulses. No carotid bruits.  Abdomen: no tenderness, no masses palpated. No hepatosplenomegaly. Bowel sounds  positive.  Musculoskeletal: no clubbing / cyanosis. No joint deformity upper and lower extremities. Good ROM, no contractures. Normal muscle tone.  Skin: no rashes, lesions, ulcers. No induration Neurologic: CN 2-12 grossly intact. Sensation intact, DTR normal. Strength 5/5 in all 4.  Psychiatric: Normal judgment and insight. Alert and oriented x 3. Normal mood.    Impression and Plan:  Encounter for preventive health examination  - Plan: PSA, PSA -Recommend routine eye and dental care. -Immunizations: Final shingles and PCV 20 given in office today, advised COVID at pharmacy -Healthy lifestyle discussed in detail. -Labs to be updated today. -Colon cancer screening: 05/2022, 7-year callback -Breast cancer screening: Not applicable -Cervical cancer screening: Not applicable -Lung cancer screening: Not applicable -Prostate cancer screening: PSA today -DEXA: Not applicable  Attention deficit hyperactivity disorder (ADHD), predominantly inattentive type  -Controlled substance database is down today, however he has no history of misuse of controlled substances. -I will refill Adderall XR 20 mg that he takes daily for total of 30 tablets a month x3 months.  He also takes Adderall 10 mg in the afternoon,  30 tablets a month x3 months have been prescribed.  Need for shingles vaccine  - Plan: Varicella-zoster vaccine IM  Current mild episode of major depressive disorder, unspecified whether recurrent ([facility]) Flowsheet Row Video Visit from 11/07/2021 in [facility]  PHQ-9 Total Score 2      -Mood is stable on bupropion.  Mixed hyperlipidemia  - Plan: CBC with Differential/Platelet, Comprehensive metabolic panel, Lipid panel, Lipid panel, Comprehensive metabolic panel, CBC with Differential/Platelet  Vitamin B12 deficiency  - Plan: Vitamin B12, Vitamin B12  Vitamin D deficiency  - Plan: VITAMIN D 25 Hydroxy (Vit-D Deficiency, Fractures), VITAMIN D 25 Hydroxy (Vit-D  Deficiency, Fractures)  Morbid obesity (HCC)  - Plan: Hemoglobin A1c, TSH, TSH, Hemoglobin A1c -Discussed healthy lifestyle, including increased physical activity and better food choices to promote weight loss. -Congratulated on weight loss success thus far.  Need for pneumococcal 20-valent conjugate vaccination - Pneumococcal conjugate vaccine 20-valent (Prevnar 20)    Patient Instructions  -Nice seeing you today!!  -Lab work today; will notify you once results are available.  -Pneumonia and shingles vaccines today.  -Remember COVID vaccine at your pharmacy.      Lelon Frohlich, MD [facility]

## 2022-07-25 ENCOUNTER — Ambulatory Visit (INDEPENDENT_AMBULATORY_CARE_PROVIDER_SITE_OTHER): Payer: BC Managed Care – PPO

## 2022-07-25 DIAGNOSIS — E538 Deficiency of other specified B group vitamins: Secondary | ICD-10-CM

## 2022-07-25 MED ORDER — CYANOCOBALAMIN 1000 MCG/ML IJ SOLN
1000.0000 ug | Freq: Once | INTRAMUSCULAR | Status: AC
  Administered 2022-07-25: 1000 ug via INTRAMUSCULAR

## 2022-07-25 NOTE — Progress Notes (Signed)
Per orders of Isaac Bliss, Rayford Halsted, MD, injection of B12 given in   left deltoid by Donatello Kleve D Shadiyah Wernli. Patient tolerated injection well.  Lab Results  Component Value Date   VITAMINB12 194 (L) 07/23/2022

## 2022-08-11 ENCOUNTER — Encounter (INDEPENDENT_AMBULATORY_CARE_PROVIDER_SITE_OTHER): Payer: Self-pay | Admitting: Ophthalmology

## 2022-08-11 ENCOUNTER — Ambulatory Visit (INDEPENDENT_AMBULATORY_CARE_PROVIDER_SITE_OTHER): Payer: BC Managed Care – PPO | Admitting: Ophthalmology

## 2022-08-11 DIAGNOSIS — H35372 Puckering of macula, left eye: Secondary | ICD-10-CM

## 2022-08-11 DIAGNOSIS — Z8669 Personal history of other diseases of the nervous system and sense organs: Secondary | ICD-10-CM

## 2022-08-11 DIAGNOSIS — H35352 Cystoid macular degeneration, left eye: Secondary | ICD-10-CM

## 2022-08-11 DIAGNOSIS — H2702 Aphakia, left eye: Secondary | ICD-10-CM | POA: Diagnosis not present

## 2022-08-11 NOTE — Progress Notes (Signed)
08/11/2022     CHIEF COMPLAINT Patient presents for  Chief Complaint  Patient presents with   Cystoid Macular Edema      HISTORY OF PRESENT ILLNESS: Alan Tyler is a 66 y.o. male who presents to the clinic today for:   HPI   1 year fu ou oct fp Pt states his vision has been stable Pt denies any new floaters or FOL   Last edited by Morene Rankins, CMA on 08/11/2022  8:00 AM.      Referring physician: Isaac Bliss, Rayford Halsted, MD [place],  [place] [id number]  HISTORICAL INFORMATION:   Selected notes from the [place] [facility]    Lab Results  Component Value Date   HGBA1C 5.4 07/23/2022     CURRENT MEDICATIONS: No current outpatient medications on file. (Ophthalmic Drugs)   No current facility-administered medications for this visit. (Ophthalmic Drugs)   Current Outpatient Medications (Other)  Medication Sig   amphetamine-dextroamphetamine (ADDERALL XR) 20 MG 24 hr capsule Take 1 capsule (20 mg total) by mouth daily.   amphetamine-dextroamphetamine (ADDERALL XR) 20 MG 24 hr capsule Take 1 capsule (20 mg total) by mouth daily.   amphetamine-dextroamphetamine (ADDERALL XR) 20 MG 24 hr capsule Take 1 capsule (20 mg total) by mouth daily.   amphetamine-dextroamphetamine (ADDERALL) 10 MG tablet Take 1 tablet (10 mg total) by mouth daily as needed.   amphetamine-dextroamphetamine (ADDERALL) 10 MG tablet Take 1 tablet (10 mg total) by mouth daily with breakfast.   amphetamine-dextroamphetamine (ADDERALL) 10 MG tablet Take 1 tablet (10 mg total) by mouth daily with breakfast.   buPROPion (WELLBUTRIN XL) 300 MG 24 hr tablet Take 1 tablet (300 mg total) by mouth every morning.   Multiple Vitamin (ONE-A-DAY MENS PO) Take 1 tablet by mouth daily.   OVER THE COUNTER MEDICATION NATURAL PSYLIUM HUSK FIBER 0.52 grams,(5Capsules) daily   rosuvastatin (CRESTOR) 20 MG tablet Take 1 tablet (20 mg total) by mouth daily.   No current facility-administered  medications for this visit. (Other)      REVIEW OF SYSTEMS: ROS   Negative for: Constitutional, Gastrointestinal, Neurological, Skin, Genitourinary, Musculoskeletal, HENT, Endocrine, Cardiovascular, Eyes, Respiratory, Psychiatric, Allergic/Imm, Heme/Lymph Last edited by Orene Desanctis D, CMA on 08/11/2022  8:00 AM.       ALLERGIES Allergies  Allergen Reactions   Atorvastatin Other (See Comments)    Myalgia   Sulfa Drugs Cross Reactors     Per pt: unknown    PAST MEDICAL HISTORY Past Medical History:  Diagnosis Date   Allergy    SEASONAL   Anxiety    Arthritis    Cancer ([place]) 2003   kidney - 2003   Cataract    BILATERAL-REMOVED   Chronic kidney disease    kidney stones and (R) nephrectomy   Colon polyps    Depression    GERD (gastroesophageal reflux disease)    Hyperlipidemia    PVC's (premature ventricular contractions)    arrhythmia   Past Surgical History:  Procedure Laterality Date   CATARACT EXTRACTION     right eye   COLONOSCOPY  2014   NEPHRECTOMY  2003   (R) for cancer tumor   POLYPECTOMY     RETINAL DETACHMENT SURGERY  2012   TOTAL KNEE REPLACEMENTS Bilateral     FAMILY HISTORY Family History  Problem Relation Age of Onset   Arthritis Mother    Depression Mother    Colon cancer Mother 54       part  of colon was removed   Alcohol abuse Father    Heart disease Father    Hyperlipidemia Father    Hyperlipidemia Maternal Grandmother    Hyperlipidemia Maternal Grandfather    Hyperlipidemia Paternal Grandmother    Diabetes Paternal Grandmother    Rectal cancer Paternal Grandmother    Arthritis Paternal Grandfather    Hyperlipidemia Paternal Grandfather    Stomach cancer Neg Hx    Esophageal cancer Neg Hx    Crohn's disease Neg Hx     SOCIAL HISTORY Social History   Tobacco Use   Smoking status: Former    Types: Cigarettes    Quit date: 12/29/2001    Years since quitting: 20.6   Smokeless tobacco: Former    Quit date: 12/27/2002   Vaping Use   Vaping Use: Never used  Substance Use Topics   Alcohol use: Not Currently    Alcohol/week: 4.0 standard drinks of alcohol    Types: 4 Standard drinks or equivalent per week   Drug use: No         OPHTHALMIC EXAM:  Base Eye Exam     Visual Acuity (ETDRS)       Right Left   Dist [place] 20/20 CF at 3'         Tonometry (Tonopen, 8:04 AM)       Right Left   Pressure 10 8         Pupils       Pupils Shape   Right PERRL Round   Left PERRL          Visual Fields       Left Right    Full Full         Extraocular Movement       Right Left    Ortho Ortho    -- -- --  --  --  -- -- --   -- -- --  --  --  -- -- --           Neuro/Psych     Oriented x3: Yes   Mood/Affect: Normal         Dilation     Both eyes: 1.0% Mydriacyl, 2.5% Phenylephrine @ 8:01 AM           Slit Lamp and Fundus Exam     External Exam       Right Left   External Normal Normal         Slit Lamp Exam       Right Left   Lids/Lashes Normal Normal   Conjunctiva/Sclera White and quiet White and quiet   Cornea Clear Clear   Anterior Chamber Deep and quiet Deep and quiet   Iris Round and reactive Round and reactive   Lens Posterior chamber intraocular lens Aphakia   Anterior Vitreous Normal Normal         Fundus Exam       Right Left   Posterior Vitreous Normal Clear, vitrectomized   Disc Normal Normal   C/D Ratio 0.1 0.25   Macula Normal Epiretinal membrane   Vessels Normal Normal   Periphery Good scleral buckle and cryopexy, retinopexy superotemporal and encircling  Good scleral buckle 360 with cryopexy and retinopexy.            IMAGING AND PROCEDURES  Imaging and Procedures for 08/11/22  OCT, Retina - OU - Both Eyes       Right Eye Quality was good. Scan locations included subfoveal. Central Foveal Thickness:  302. Progression has been stable. Findings include normal foveal contour.   Left Eye Quality was good. Scan  locations included subfoveal. Central Foveal Thickness: 323. Progression has been stable. Findings include abnormal foveal contour, cystoid macular edema, epiretinal membrane.   Notes Minor chronic perifoveal CME which is been unchanged now for many years left eye, minor perifoveal CME, minor ERM.  OS.  OD normal     Color Fundus Photography Optos - OU - Both Eyes       Right Eye Progression has no prior data.   Left Eye Progression has been stable. Macula : epiretinal membrane.   Notes Clear media, retina attached OS, shiny ILM reflex  OD clear media, good retinopexy, good buckle.               ASSESSMENT/PLAN:  Aphakia of eye, left OS, excellent candidate now at this age group and with intact retina and minor epiretinal membrane with CME.  For consideration of pseudophakic repair of the aphakic condition given vitrectomy, use of Yamane scleral tunnel with CT Lucia centration of lens.  I explained the patient that this would afford excellent chance that peripheral vision recovery and potential for central visual recovery since there is a minor epiretinal membrane and residual perifoveal CME OS.  Nonetheless peripheral vision would likely improve substantially  Cystoid macular edema of left eye Minor perifoveal  Epiretinal membrane (ERM) of left eye OS, could be contributory to ERM.  Should vitrectomy with placement of posterior chamber intraocular lens suspended via Yamane scleral tunnel to be undertaken, appropriately membrane peel would be of use to potentially maximize visual acuity long-term OS  History of retinal detachment OS, doing very well after complex retinal detachment repair, left in an aphakic condition due to giant retinal tear with detachment in the past.  The aphakic condition can now be addressed via scleral tunnel fixation of posterior chamber intraocular lens with minimal risk long-term to the cornea as has been discussed in the past.  Patient  should consider vitrectomy placement of PCIOL via scleral tunnel technique suspension     ICD-10-CM   1. Cystoid macular edema of left eye  H35.352 OCT, Retina - OU - Both Eyes    Color Fundus Photography Optos - OU - Both Eyes    2. Aphakia of eye, left  H27.02     3. Epiretinal membrane (ERM) of left eye  H35.372     4. History of retinal detachment  Z86.69       1.  OD looks great history of retinal detachment, no new breaks.  Clear media pseudophakic condition  2.  OS aphakic condition, with epiretinal membrane mild.  Vitrectomy membrane peel for the left eye should only be undertaken if pseudophakic condition is restored either via Yamane scleral technique PCIOL suspension and/or secondary AC IOL insertion.  3.  Follow-up in 1 year or sooner should patient want to correct the aphakic condition in the left eye.  If video Dr. Aaron Edelman Kim's Margaree Mackintosh scleral technique was reviewed here in the office today briefly the patient  Ophthalmic Meds Ordered this visit:  No orders of the defined types were placed in this encounter.      Return in about 1 year (around 08/12/2023) for Or any time patient interested in surgical correction of a aphakia.  Patient Instructions  Eye, consider vitrectomy membrane peel for epiretinal membrane, but also surgical correction of the lack of lens in the left eye that is the aphakic condition.  Surgery would  include suspension of a posterior chamber lens (behind the iris) via Margaree Mackintosh scleral tunnel technique see video again Dr. Thermon Leyland of [place]  Explained the diagnoses, plan, and follow up with the patient and they expressed understanding.  Patient expressed understanding of the importance of proper follow up care.   Clent Demark Gerrad Welker M.D. Diseases & Surgery of the Retina and Vitreous [facility] [place] 08/11/22     Abbreviations: M myopia (nearsighted); A astigmatism; H hyperopia (farsighted); P presbyopia; Mrx spectacle prescription;  CTL  contact lenses; OD right eye; OS left eye; OU both eyes  XT exotropia; ET esotropia; PEK punctate epithelial keratitis; PEE punctate epithelial erosions; DES dry eye syndrome; MGD meibomian gland dysfunction; ATs artificial tears; PFAT's preservative free artificial tears; [place] nuclear sclerotic cataract; PSC posterior subcapsular cataract; ERM epi-retinal membrane; PVD posterior vitreous detachment; RD retinal detachment; DM diabetes mellitus; DR diabetic retinopathy; NPDR non-proliferative diabetic retinopathy; PDR proliferative diabetic retinopathy; CSME clinically significant macular edema; DME diabetic macular edema; dbh dot blot hemorrhages; CWS cotton wool spot; POAG primary open angle glaucoma; C/D cup-to-disc ratio; HVF humphrey visual field; GVF goldmann visual field; OCT optical coherence tomography; IOP intraocular pressure; BRVO Branch retinal vein occlusion; CRVO central retinal vein occlusion; CRAO central retinal artery occlusion; BRAO branch retinal artery occlusion; RT retinal tear; SB scleral buckle; PPV pars plana vitrectomy; VH Vitreous hemorrhage; PRP panretinal laser photocoagulation; IVK intravitreal kenalog; VMT vitreomacular traction; MH Macular hole;  NVD neovascularization of the disc; NVE neovascularization elsewhere; AREDS age related eye disease study; ARMD age related macular degeneration; POAG primary open angle glaucoma; EBMD epithelial/anterior basement membrane dystrophy; ACIOL anterior chamber intraocular lens; IOL intraocular lens; PCIOL posterior chamber intraocular lens; Phaco/IOL phacoemulsification with intraocular lens placement; [place] photorefractive keratectomy; LASIK laser assisted in situ keratomileusis; HTN hypertension; DM diabetes mellitus; COPD chronic obstructive pulmonary disease

## 2022-08-11 NOTE — Assessment & Plan Note (Signed)
Minor perifoveal

## 2022-08-11 NOTE — Assessment & Plan Note (Signed)
OS, excellent candidate now at this age group and with intact retina and minor epiretinal membrane with CME.  For consideration of pseudophakic repair of the aphakic condition given vitrectomy, use of Yamane scleral tunnel with CT Lucia centration of lens.  I explained the patient that this would afford excellent chance that peripheral vision recovery and potential for central visual recovery since there is a minor epiretinal membrane and residual perifoveal CME OS.  Nonetheless peripheral vision would likely improve substantially

## 2022-08-11 NOTE — Assessment & Plan Note (Signed)
OS, doing very well after complex retinal detachment repair, left in an aphakic condition due to giant retinal tear with detachment in the past.  The aphakic condition can now be addressed via scleral tunnel fixation of posterior chamber intraocular lens with minimal risk long-term to the cornea as has been discussed in the past.  Patient should consider vitrectomy placement of PCIOL via scleral tunnel technique suspension

## 2022-08-11 NOTE — Patient Instructions (Signed)
Eye, consider vitrectomy membrane peel for epiretinal membrane, but also surgical correction of the lack of lens in the left eye that is the aphakic condition.  Surgery would include suspension of a posterior chamber lens (behind the iris) via Margaree Mackintosh scleral tunnel technique see video again Dr. Thermon Leyland of [place]

## 2022-08-11 NOTE — Assessment & Plan Note (Signed)
OS, could be contributory to ERM.  Should vitrectomy with placement of posterior chamber intraocular lens suspended via Yamane scleral tunnel to be undertaken, appropriately membrane peel would be of use to potentially maximize visual acuity long-term OS

## 2022-08-25 ENCOUNTER — Ambulatory Visit (INDEPENDENT_AMBULATORY_CARE_PROVIDER_SITE_OTHER): Payer: BC Managed Care – PPO

## 2022-08-25 DIAGNOSIS — E538 Deficiency of other specified B group vitamins: Secondary | ICD-10-CM

## 2022-08-25 MED ORDER — CYANOCOBALAMIN 1000 MCG/ML IJ SOLN
1000.0000 ug | Freq: Once | INTRAMUSCULAR | Status: AC
  Administered 2022-08-25: 1000 ug via INTRAMUSCULAR

## 2022-08-25 NOTE — Progress Notes (Signed)
Pt here for monthly B12 injection per Dr. Jerilee Hoh  B12 1067mg given IM, and pt tolerated injection well.

## 2022-09-25 ENCOUNTER — Ambulatory Visit (INDEPENDENT_AMBULATORY_CARE_PROVIDER_SITE_OTHER): Payer: BC Managed Care – PPO

## 2022-09-25 DIAGNOSIS — E538 Deficiency of other specified B group vitamins: Secondary | ICD-10-CM | POA: Diagnosis not present

## 2022-09-25 MED ORDER — CYANOCOBALAMIN 1000 MCG/ML IJ SOLN
1000.0000 ug | Freq: Once | INTRAMUSCULAR | Status: AC
  Administered 2022-09-25: 1000 ug via INTRAMUSCULAR

## 2022-09-25 NOTE — Progress Notes (Signed)
Pt here for monthly B12 injection per Dr Jerilee Hoh.  B12 1019mg given IM, and pt tolerated injection well.  Next B12 injection scheduled for next week.

## 2022-09-29 ENCOUNTER — Other Ambulatory Visit: Payer: Self-pay | Admitting: Internal Medicine

## 2022-10-16 ENCOUNTER — Other Ambulatory Visit: Payer: Self-pay | Admitting: Internal Medicine

## 2022-10-16 DIAGNOSIS — F900 Attention-deficit hyperactivity disorder, predominantly inattentive type: Secondary | ICD-10-CM

## 2022-10-17 ENCOUNTER — Ambulatory Visit (INDEPENDENT_AMBULATORY_CARE_PROVIDER_SITE_OTHER): Payer: BC Managed Care – PPO | Admitting: Internal Medicine

## 2022-10-17 VITALS — BP 128/90 | HR 81 | Ht 67.0 in | Wt 240.2 lb

## 2022-10-17 DIAGNOSIS — E785 Hyperlipidemia, unspecified: Secondary | ICD-10-CM | POA: Diagnosis not present

## 2022-10-17 DIAGNOSIS — I2510 Atherosclerotic heart disease of native coronary artery without angina pectoris: Secondary | ICD-10-CM | POA: Diagnosis not present

## 2022-10-17 DIAGNOSIS — I77810 Thoracic aortic ectasia: Secondary | ICD-10-CM | POA: Diagnosis not present

## 2022-10-17 NOTE — Progress Notes (Signed)
OFFICE CONSULT NOTE  Chief Complaint:  No complaints  Primary Care Physician: Isaac Bliss, Rayford Halsted, MD  HPI:  Alan Tyler is a 66 y.o. male who is being seen today for the evaluation of chest pain at the request of Isaac Bliss, [place] [facility]*. This is a very pleasant 66 year old male who works at the [employer].  He has a history of chest pain in the past been evaluated once in 2009 with stress testing in [place] and he previously saw Dr. Peter Martinique in 2014.  At the time he was having some PVCs which were noted when he donated blood.  He underwent an exercise stress echocardiogram which was negative for ischemia.  He had also had some atypical sounding chest pain.  He does have heart disease in his family coding his father who had premature onset coronary disease.  Recently he was working and noticed he had some left-sided chest pain as well as numbness and tingling in his left arm which was a new finding.  I lasted for 15 to 20 minutes.  Subsequently he was seen in the emergency department.  EKG showed no ischemic changes.  He had negative troponins.  He was asked to follow-up with cardiology for further testing.  He denies any recurrent anginal symptoms since then.  He does have some degree of dyslipidemia with total cholesterol in March at 199, HDL 39, LDL 137 and triglycerides 112.  This was addressed with his primary care provider and the plan was to work on diet and lifestyle modification and repeat his lipids this summer.  No history of hypertension or diabetes and is not currently on any medications other than some bupropion and Adderall.  04/03/2022  Alan Tyler returns today for follow-up.  He had undergone CT calcium scoring which showed a calcium score of 123, 65th percentile for age and sex matched controls.  This also showed a dilated aorta at 40 mm.  This was in May 2022.  I advised starting atorvastatin but he said he took it for about a month or so and had  significant myalgias.  Subsequently he stopped the medicine.  He then had issues with both of his knees and require knee replacements.  Unfortunately he also said that his son died unexpectedly last month.  He remains asymptomatic denying chest pain or shortness of breath.  10/17/2022  Alan Tyler is seen today in follow-up.  He is doing very well.  He denies any worsening shortness of breath or chest pain.  His lipids are much better now showing total cholesterol 119, triglycerides 78, HDL 37 and LDL 63.  He seems to be tolerating rosuvastatin much better than atorvastatin.  Blood pressure is controlled.  His EKG today shows a sinus rhythm with PVCs.  He reports no symptoms with the PVCs but says he has had those before.  He thinks they may be related to Adderall and caffeine.  He says he drinks about 4 cups of coffee a day.  PMHx:  Past Medical History:  Diagnosis Date   Allergy    SEASONAL   Anxiety    Arthritis    Cancer ([place]) 2003   kidney - 2003   Cataract    BILATERAL-REMOVED   Chronic kidney disease    kidney stones and (R) nephrectomy   Colon polyps    Depression    GERD (gastroesophageal reflux disease)    Hyperlipidemia    PVC's (premature ventricular contractions)    arrhythmia  Past Surgical History:  Procedure Laterality Date   CATARACT EXTRACTION     right eye   COLONOSCOPY  2014   NEPHRECTOMY  2003   (R) for cancer tumor   POLYPECTOMY     RETINAL DETACHMENT SURGERY  2012   TOTAL KNEE REPLACEMENTS Bilateral     FAMHx:  Family History  Problem Relation Age of Onset   Arthritis Mother    Depression Mother    Colon cancer Mother 56       part of colon was removed   Alcohol abuse Father    Heart disease Father    Hyperlipidemia Father    Hyperlipidemia Maternal Grandmother    Hyperlipidemia Maternal Grandfather    Hyperlipidemia Paternal Grandmother    Diabetes Paternal Grandmother    Rectal cancer Paternal Grandmother    Arthritis Paternal  Grandfather    Hyperlipidemia Paternal Grandfather    Stomach cancer Neg Hx    Esophageal cancer Neg Hx    Crohn's disease Neg Hx     SOCHx:   reports that he quit smoking about 20 years ago. His smoking use included cigarettes. He quit smokeless tobacco use about 19 years ago. He reports that he does not currently use alcohol after a past usage of about 4.0 standard drinks of alcohol per week. He reports that he does not use drugs.  ALLERGIES:  Allergies  Allergen Reactions   Atorvastatin Other (See Comments)    Myalgia   Sulfa Drugs Cross Reactors     Per pt: unknown    ROS: Pertinent items noted in HPI and remainder of comprehensive ROS otherwise negative.  HOME MEDS: Current Outpatient Medications on File Prior to Visit  Medication Sig Dispense Refill   amphetamine-dextroamphetamine (ADDERALL XR) 20 MG 24 hr capsule Take 1 capsule (20 mg total) by mouth daily. 30 capsule 0   amphetamine-dextroamphetamine (ADDERALL) 10 MG tablet Take 1 tablet (10 mg total) by mouth daily as needed. 30 tablet 0   buPROPion (WELLBUTRIN XL) 300 MG 24 hr tablet Take 1 tablet (300 mg total) by mouth daily. 90 tablet 1   Multiple Vitamin (ONE-A-DAY MENS PO) Take 1 tablet by mouth daily.     OVER THE COUNTER MEDICATION NATURAL PSYLIUM HUSK FIBER 0.52 grams,(5Capsules) daily     rosuvastatin (CRESTOR) 20 MG tablet Take 1 tablet (20 mg total) by mouth daily. 90 tablet 3   No current facility-administered medications on file prior to visit.    LABS/IMAGING: No results found for this or any previous visit (from the past 48 hour(s)). No results found.  LIPID PANEL:    Component Value Date/Time   CHOL 118 07/23/2022 1415   CHOL 197 04/03/2022 0855   TRIG 78.0 07/23/2022 1415   HDL 38.70 (L) 07/23/2022 1415   HDL 38 (L) 04/03/2022 0855   CHOLHDL 3 07/23/2022 1415   VLDL 15.6 07/23/2022 1415   LDLCALC 63 07/23/2022 1415   LDLCALC 140 (H) 04/03/2022 0855   LDLDIRECT 163.2 11/29/2012 0805     WEIGHTS: Wt Readings from Last 3 Encounters:  10/17/22 240 lb 3.2 oz (109 kg)  07/23/22 230 lb 9.6 oz (104.6 kg)  06/02/22 240 lb (108.9 kg)    VITALS: BP (!) 128/90 (BP Location: Left Arm, Patient Position: Sitting, Cuff Size: Large)   Pulse 81   Ht '5\' 7"'$  (1.702 m)   Wt 240 lb 3.2 oz (109 kg)   BMI 37.62 kg/m   EXAM: General appearance: alert and no distress Neck: no carotid  bruit, no JVD and thyroid not enlarged, symmetric, no tenderness/mass/nodules Lungs: clear to auscultation bilaterally Heart: regular rate and rhythm Abdomen: soft, non-tender; bowel sounds normal; no masses,  no organomegaly Extremities: extremities normal, atraumatic, no cyanosis or edema Pulses: 2+ and symmetric Skin: Skin color, texture, turgor normal. No rashes or lesions Neurologic: Grossly normal Psych: Pleasant  EKG: Sinus rhythm with frequent PVCs at 81-personally reviewed  ASSESSMENT: Atypical chest pain -low risk Myoview (04/2021) CAC score 123, 65th percentile for age and sex matched controls (04/2022) Dilated ascending aorta to 40 mm -repeat CT scan was not dilated Strong family history of premature coronary disease Mixed dyslipidemia Asymptomatic PVCs  PLAN: 1.   Alan Tyler was noted to have asymptomatic PVCs on his EKG today.  This might be related to caffeine intake as well as he is SAMS stimulant for ADD.  He has had no further chest pain.  His lipids are much better controlled on rosuvastatin which he is tolerating.  We will continue his current medications.  He did have a repeat CT scan which showed no dilation of the ascending aorta.  There was a small left upper lobe nodule.  Plan to repeat the CT angio aorta to reassess both of these things in 1 year.  Plan follow-up with me annually or sooner as necessary.  Pixie Casino, MD, [facility], [place] Director of the Advanced Lipid Disorders &  Cardiovascular Risk Reduction Clinic Diplomate of the  American Board of Clinical Lipidology Attending Cardiologist  Direct Dial: [phone]  Fax: [phone]  Website:  [email or web address] 10/17/2022, 9:36 AM

## 2022-10-17 NOTE — Patient Instructions (Signed)
Medication Instructions:  NO CHANGES  *If you need a refill on your cardiac medications before your next appointment, please call your pharmacy*    Follow-Up: At [facility], you and your health needs are our priority.  As part of our continuing mission to provide you with exceptional heart care, we have created designated Provider Care Teams.  These Care Teams include your primary Cardiologist (physician) and Advanced Practice Providers (APPs -  Physician Assistants and Nurse Practitioners) who all work together to provide you with the care you need, when you need it.  We recommend signing up for the patient portal called "MyChart".  Sign up information is provided on this After Visit Summary.  MyChart is used to connect with patients for Virtual Visits (Telemedicine).  Patients are able to view lab/test results, encounter notes, upcoming appointments, etc.  Non-urgent messages can be sent to your provider as well.   To learn more about what you can do with MyChart, go to [email or web address].    Your next appointment:   12 month(s)  The format for your next appointment:   In Person  Provider:   Kenneth C Hilty, MD   

## 2022-10-24 ENCOUNTER — Ambulatory Visit (INDEPENDENT_AMBULATORY_CARE_PROVIDER_SITE_OTHER): Payer: BC Managed Care – PPO

## 2022-10-24 ENCOUNTER — Other Ambulatory Visit: Payer: Self-pay | Admitting: Internal Medicine

## 2022-10-24 DIAGNOSIS — E538 Deficiency of other specified B group vitamins: Secondary | ICD-10-CM

## 2022-10-24 DIAGNOSIS — F900 Attention-deficit hyperactivity disorder, predominantly inattentive type: Secondary | ICD-10-CM

## 2022-10-24 MED ORDER — CYANOCOBALAMIN 1000 MCG/ML IJ SOLN
1000.0000 ug | Freq: Once | INTRAMUSCULAR | Status: AC
  Administered 2022-10-24: 1000 ug via INTRAMUSCULAR

## 2022-10-24 NOTE — Progress Notes (Signed)
Per orders of Dr. Elease Hashimoto, injection of Cyanocobalamin inj. 1000 mcg given by Encarnacion Slates on Right Deltoid.  Patient tolerated injection well.   Next injection is due next month.

## 2022-10-27 ENCOUNTER — Telehealth: Payer: Self-pay | Admitting: Internal Medicine

## 2022-10-27 MED ORDER — AMPHETAMINE-DEXTROAMPHET ER 20 MG PO CP24: 20.0000 mg | ORAL_CAPSULE | Freq: Every day | ORAL | 0 refills | Status: DC

## 2022-10-27 MED ORDER — AMPHETAMINE-DEXTROAMPHETAMINE 10 MG PO TABS: 10.0000 mg | ORAL_TABLET | Freq: Every day | ORAL | 0 refills | Status: DC | PRN

## 2022-10-27 NOTE — Telephone Encounter (Signed)
Last office visit was 07/23/22

## 2022-10-27 NOTE — Telephone Encounter (Signed)
Refills sent

## 2022-10-27 NOTE — Telephone Encounter (Signed)
Pt wife called to get Rx refills for the following:  amphetamine-dextroamphetamine (ADDERALL XR) 20 MG 24 hr capsule  amphetamine-dextroamphetamine (ADDERALL) 10 MG tablet  Pt wife is aware that it will be a 72 hr turn-around.  Please advise.

## 2022-10-30 ENCOUNTER — Telehealth: Payer: Self-pay | Admitting: Internal Medicine

## 2022-10-30 DIAGNOSIS — F900 Attention-deficit hyperactivity disorder, predominantly inattentive type: Secondary | ICD-10-CM

## 2022-10-30 MED ORDER — AMPHETAMINE-DEXTROAMPHET ER 20 MG PO CP24: 20.0000 mg | ORAL_CAPSULE | Freq: Every day | ORAL | 0 refills | Status: DC

## 2022-10-30 NOTE — Telephone Encounter (Signed)
Pt wife susan is calling and the pharm would not refill amphetamine-dextroamphetamine (ADDERALL XR) 20 MG 24 hr capsule  due to instruction said refill in 2 month. Pt need medication for this month Nov  [facility] [place], [place] Phone: [phone]  Fax: [phone]

## 2022-11-03 NOTE — Telephone Encounter (Signed)
Refill sent.

## 2022-11-24 ENCOUNTER — Ambulatory Visit (INDEPENDENT_AMBULATORY_CARE_PROVIDER_SITE_OTHER): Payer: BC Managed Care – PPO | Admitting: *Deleted

## 2022-11-24 DIAGNOSIS — E538 Deficiency of other specified B group vitamins: Secondary | ICD-10-CM

## 2022-11-24 MED ORDER — CYANOCOBALAMIN 1000 MCG/ML IJ SOLN
1000.0000 ug | Freq: Once | INTRAMUSCULAR | Status: AC
  Administered 2022-11-24: 1000 ug via INTRAMUSCULAR

## 2022-11-24 NOTE — Progress Notes (Signed)
Per orders of Dr. Hernandez, injection of B12 given by Rashanda Magloire. Patient tolerated injection well.  

## 2022-12-01 ENCOUNTER — Telehealth: Payer: Self-pay | Admitting: Internal Medicine

## 2022-12-01 NOTE — Telephone Encounter (Signed)
Appointment scheduled.

## 2022-12-01 NOTE — Telephone Encounter (Signed)
Requestng refill amphetamine-dextroamphetamine (ADDERALL XR) 20 MG 24 hr capsule  amphetamine-dextroamphetamine (ADDERALL) 10 MG tablet [id number] - [place], [place] - [address] Phone: [phone]  Fax: [phone]

## 2022-12-02 ENCOUNTER — Encounter: Payer: Self-pay | Admitting: Internal Medicine

## 2022-12-02 ENCOUNTER — Telehealth (INDEPENDENT_AMBULATORY_CARE_PROVIDER_SITE_OTHER): Payer: BC Managed Care – PPO | Admitting: Internal Medicine

## 2022-12-02 DIAGNOSIS — F900 Attention-deficit hyperactivity disorder, predominantly inattentive type: Secondary | ICD-10-CM | POA: Diagnosis not present

## 2022-12-02 MED ORDER — AMPHETAMINE-DEXTROAMPHET ER 20 MG PO CP24: 20.0000 mg | ORAL_CAPSULE | Freq: Every day | ORAL | 0 refills | Status: DC

## 2022-12-02 MED ORDER — AMPHETAMINE-DEXTROAMPHETAMINE 10 MG PO TABS: 10.0000 mg | ORAL_TABLET | Freq: Every day | ORAL | 0 refills | Status: DC | PRN

## 2022-12-02 MED ORDER — AMPHETAMINE-DEXTROAMPHETAMINE 10 MG PO TABS: 10.0000 mg | ORAL_TABLET | Freq: Every day | ORAL | 0 refills | Status: DC

## 2022-12-02 NOTE — Progress Notes (Signed)
Virtual Visit via Video Note  I connected with Alan Tyler on 12/02/22 at  4:00 PM EST by a video enabled telemedicine application and verified that I am speaking with the correct person using two identifiers.  Location patient: home Location provider: work office Persons participating in the virtual visit: patient, provider  I discussed the limitations of evaluation and management by telemedicine and the availability of in person appointments. The patient expressed understanding and agreed to proceed.   HPI: This is a scheduled visit for the purpose of medication refills per protocol.  He has a history of ADHD and has been taking long-term Adderall XR 20 mg in the morning with an additional 10 mg immediate release in the afternoon if needed.  He is tolerating medication well and has no untoward side effects.   ROS: Constitutional: Denies fever, chills, diaphoresis, appetite change and fatigue.  HEENT: Denies photophobia, eye pain, redness, hearing loss, ear pain, congestion, sore throat, rhinorrhea, sneezing, mouth sores, trouble swallowing, neck pain, neck stiffness and tinnitus.   Respiratory: Denies SOB, DOE, cough, chest tightness,  and wheezing.   Cardiovascular: Denies chest pain, palpitations and leg swelling.  Gastrointestinal: Denies nausea, vomiting, abdominal pain, diarrhea, constipation, blood in stool and abdominal distention.  Genitourinary: Denies dysuria, urgency, frequency, hematuria, flank pain and difficulty urinating.  Endocrine: Denies: hot or cold intolerance, sweats, changes in hair or nails, polyuria, polydipsia. Musculoskeletal: Denies myalgias, back pain, joint swelling, arthralgias and gait problem.  Skin: Denies pallor, rash and wound.  Neurological: Denies dizziness, seizures, syncope, weakness, light-headedness, numbness and headaches.  Hematological: Denies adenopathy. Easy bruising, personal or family bleeding history  Psychiatric/Behavioral: Denies  suicidal ideation, mood changes, confusion, nervousness, sleep disturbance and agitation   Past Medical History:  Diagnosis Date   Allergy    SEASONAL   Anxiety    Arthritis    Cancer ([place]) 2003   kidney - 2003   Cataract    BILATERAL-REMOVED   Chronic kidney disease    kidney stones and (R) nephrectomy   Colon polyps    Depression    GERD (gastroesophageal reflux disease)    Hyperlipidemia    PVC's (premature ventricular contractions)    arrhythmia    Past Surgical History:  Procedure Laterality Date   CATARACT EXTRACTION     right eye   COLONOSCOPY  2014   NEPHRECTOMY  2003   (R) for cancer tumor   POLYPECTOMY     RETINAL DETACHMENT SURGERY  2012   TOTAL KNEE REPLACEMENTS Bilateral     Family History  Problem Relation Age of Onset   Arthritis Mother    Depression Mother    Colon cancer Mother 58       part of colon was removed   Alcohol abuse Father    Heart disease Father    Hyperlipidemia Father    Hyperlipidemia Maternal Grandmother    Hyperlipidemia Maternal Grandfather    Hyperlipidemia Paternal Grandmother    Diabetes Paternal Grandmother    Rectal cancer Paternal Grandmother    Arthritis Paternal Grandfather    Hyperlipidemia Paternal Grandfather    Stomach cancer Neg Hx    Esophageal cancer Neg Hx    Crohn's disease Neg Hx     SOCIAL HX:   reports that he quit smoking about 20 years ago. His smoking use included cigarettes. He quit smokeless tobacco use about 19 years ago. He reports that he does not currently use alcohol after a past usage of about 4.0 standard  drinks of alcohol per week. He reports that he does not use drugs.   Current Outpatient Medications:    amphetamine-dextroamphetamine (ADDERALL XR) 20 MG 24 hr capsule, Take 1 capsule (20 mg total) by mouth daily., Disp: 30 capsule, Rfl: 0   amphetamine-dextroamphetamine (ADDERALL) 10 MG tablet, Take 1 tablet (10 mg total) by mouth daily., Disp: 30 tablet, Rfl: 0    amphetamine-dextroamphetamine (ADDERALL) 10 MG tablet, Take 1 tablet (10 mg total) by mouth daily., Disp: 30 tablet, Rfl: 0   buPROPion (WELLBUTRIN XL) 300 MG 24 hr tablet, Take 1 tablet (300 mg total) by mouth daily., Disp: 90 tablet, Rfl: 1   Multiple Vitamin (ONE-A-DAY MENS PO), Take 1 tablet by mouth daily., Disp: , Rfl:    OVER THE COUNTER MEDICATION, NATURAL PSYLIUM HUSK FIBER 0.52 grams,(5Capsules) daily, Disp: , Rfl:    rosuvastatin (CRESTOR) 20 MG tablet, Take 1 tablet (20 mg total) by mouth daily., Disp: 90 tablet, Rfl: 3   amphetamine-dextroamphetamine (ADDERALL XR) 20 MG 24 hr capsule, Take 1 capsule (20 mg total) by mouth daily., Disp: 30 capsule, Rfl: 0   amphetamine-dextroamphetamine (ADDERALL XR) 20 MG 24 hr capsule, Take 1 capsule (20 mg total) by mouth daily., Disp: 30 capsule, Rfl: 0   amphetamine-dextroamphetamine (ADDERALL) 10 MG tablet, Take 1 tablet (10 mg total) by mouth daily as needed., Disp: 30 tablet, Rfl: 0  EXAM:   VITALS per patient if applicable: None reported  GENERAL: alert, oriented, appears well and in no acute distress  HEENT: atraumatic, conjunttiva clear, no obvious abnormalities on inspection of external nose and ears  NECK: normal movements of the head and neck  LUNGS: on inspection no signs of respiratory distress, breathing rate appears normal, no obvious gross increased work of breathing, gasping or wheezing  CV: no obvious cyanosis  MS: moves all visible extremities without noticeable abnormality  PSYCH/NEURO: pleasant and cooperative, no obvious depression or anxiety, speech and thought processing grossly intact  ASSESSMENT AND PLAN:   Attention deficit hyperactivity disorder (ADHD), predominantly inattentive type - Plan: amphetamine-dextroamphetamine (ADDERALL) 10 MG tablet, amphetamine-dextroamphetamine (ADDERALL XR) 20 MG 24 hr capsule  -PDMP reviewed, no red flags, overdose risk score is 220. -Refill Adderall XR 20 mg to take 1 tablet  daily for total of 30 tablets a month x 3 months.  Also refill Adderall 10 mg immediate release to take 1 tablet in the afternoon if needed for total of 30 tablets a month x 3 months.   I discussed the assessment and treatment plan with the patient. The patient was provided an opportunity to ask questions and all were answered. The patient agreed with the plan and demonstrated an understanding of the instructions.   The patient was advised to call back or seek an in-person evaluation if the symptoms worsen or if the condition fails to improve as anticipated.    Lelon Frohlich, MD   Primary Care at [facility]

## 2023-01-16 ENCOUNTER — Encounter: Payer: Self-pay | Admitting: Internal Medicine

## 2023-02-02 ENCOUNTER — Telehealth: Payer: Self-pay | Admitting: Internal Medicine

## 2023-02-02 DIAGNOSIS — F900 Attention-deficit hyperactivity disorder, predominantly inattentive type: Secondary | ICD-10-CM

## 2023-02-02 NOTE — Telephone Encounter (Signed)
Last VV 12/02/22 Adderall 10 mg available Adderall 20 RX not available at pharmacy. Patient is aware.

## 2023-02-02 NOTE — Telephone Encounter (Signed)
Prescription Request  02/02/2023  Is this a "Controlled Substance" medicine? No  LOV: 07/23/2022  What is the name of the medication or equipment? amphetamine-dextroamphetamine (ADDERALL XR) 20 MG 24 hr capsul   Have you contacted your pharmacy to request a refill? Yes   Which pharmacy would you like this sent to?  [id number] - [place], [address] [place] [address] Phone: [phone] Fax: [phone]     Patient notified that their request is being sent to the clinical staff for review and that they should receive a response within 2 business days.   Please advise at Mobile [phone] (mobile)

## 2023-03-07 ENCOUNTER — Other Ambulatory Visit: Payer: Self-pay | Admitting: Internal Medicine

## 2023-03-07 DIAGNOSIS — F900 Attention-deficit hyperactivity disorder, predominantly inattentive type: Secondary | ICD-10-CM

## 2023-03-09 MED ORDER — AMPHETAMINE-DEXTROAMPHETAMINE 10 MG PO TABS: 10.0000 mg | ORAL_TABLET | Freq: Every day | ORAL | 0 refills | Status: DC | PRN

## 2023-03-09 MED ORDER — AMPHETAMINE-DEXTROAMPHET ER 20 MG PO CP24: 20.0000 mg | ORAL_CAPSULE | Freq: Every day | ORAL | 0 refills | Status: DC

## 2023-03-13 ENCOUNTER — Other Ambulatory Visit: Payer: Self-pay | Admitting: Internal Medicine

## 2023-03-23 ENCOUNTER — Encounter: Payer: Self-pay | Admitting: Internal Medicine

## 2023-03-24 ENCOUNTER — Other Ambulatory Visit: Payer: Self-pay | Admitting: Adult Health

## 2023-03-24 DIAGNOSIS — F900 Attention-deficit hyperactivity disorder, predominantly inattentive type: Secondary | ICD-10-CM

## 2023-03-24 MED ORDER — AMPHETAMINE-DEXTROAMPHET ER 20 MG PO CP24: 20.0000 mg | ORAL_CAPSULE | Freq: Every day | ORAL | 0 refills | Status: DC

## 2023-03-30 ENCOUNTER — Other Ambulatory Visit: Payer: Self-pay | Admitting: Internal Medicine

## 2023-04-10 ENCOUNTER — Encounter: Payer: Self-pay | Admitting: Internal Medicine

## 2023-04-10 DIAGNOSIS — F900 Attention-deficit hyperactivity disorder, predominantly inattentive type: Secondary | ICD-10-CM

## 2023-04-11 IMAGING — CT CT CARDIAC CORONARY ARTERY CALCIUM SCORE
3 series · 14 of 20 positions shown, 15 images · non-contrast
Comparison: None.
COMPARISON: None.

Addendum:
EXAM:
OVER-READ INTERPRETATION  CT CHEST

The following report is an over-read performed by radiologist Dr.
Nazareth Jumper [REDACTED] on 05/13/2021. This
over-read does not include interpretation of cardiac or coronary
anatomy or pathology. The coronary calcium score interpretation by
the cardiologist is attached.
CLINICAL DATA: Cardiovascular Disease Risk stratification
Coronary Calcium Score
TECHNIQUE: A gated, non-contrast computed tomography scan of the heart was
performed using 3mm slice thickness. Axial images were analyzed on a
dedicated workstation. Calcium scoring of the coronary arteries was
performed using the Agatston method.

[Series 2: casc 3.0 bv41 2 bestsyst 260 ms · axial · 0.46mm/px · z∈[-243,-180]mm · 4 of 35 slices shown, 5 images]
[im 7/35  vessel]
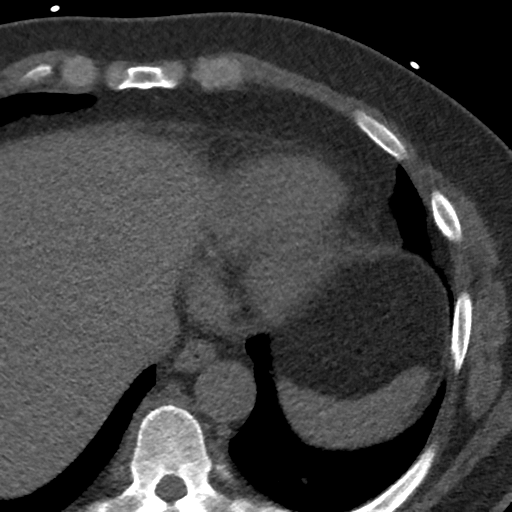
[im 7/35  lung]
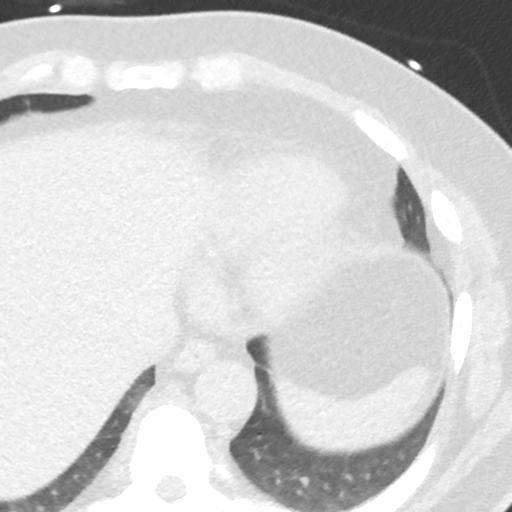
[im 14/35  vessel]
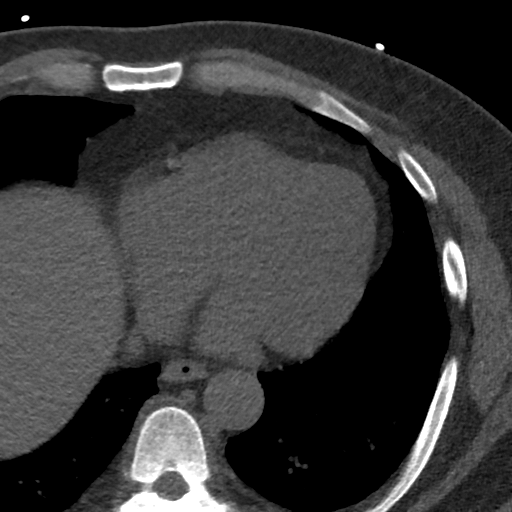
[im 21/35  vessel]
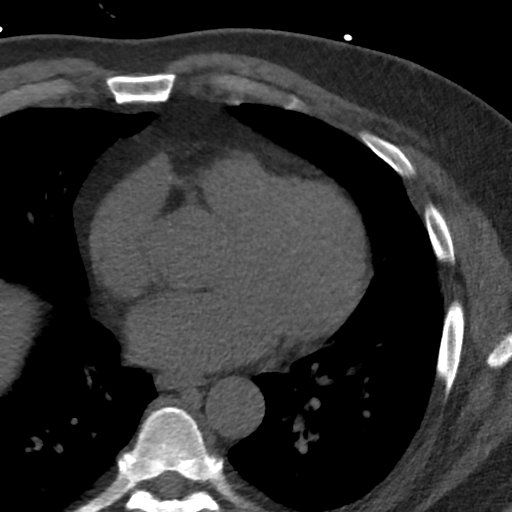
[im 28/35  vessel]
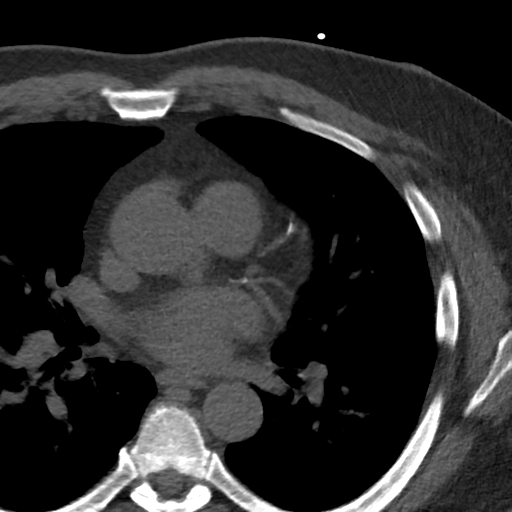

[Series 3: lung 260 ms · axial · 0.75mm/px · z∈[-246,-177]mm · 5 of 35 slices shown]
[im 6/35  lung]
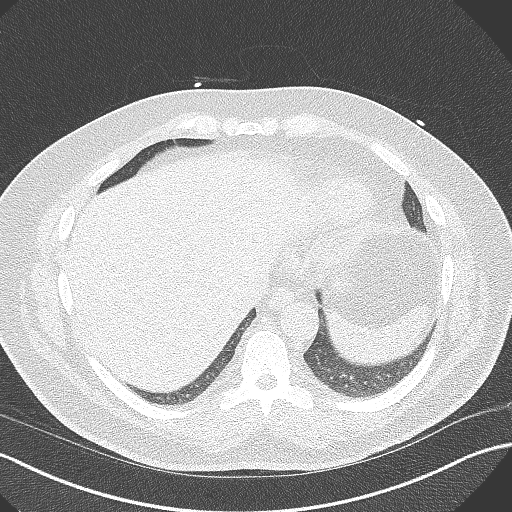
[im 12/35  lung]
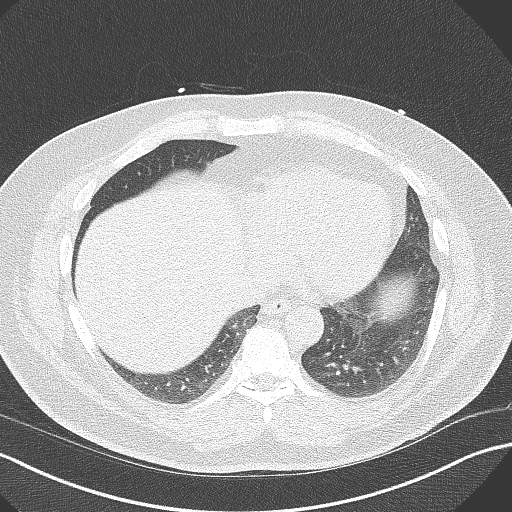
[im 18/35  lung]
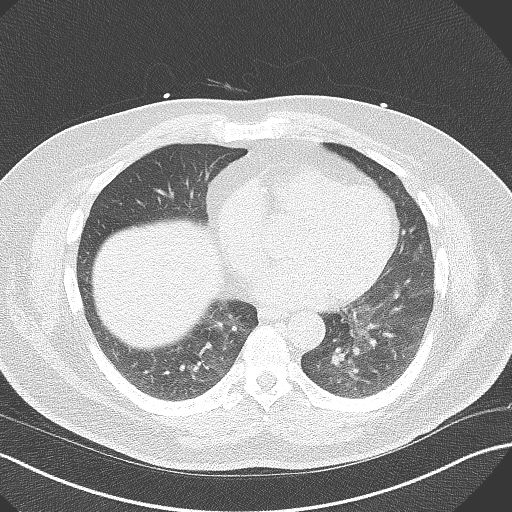
[im 23/35  lung]
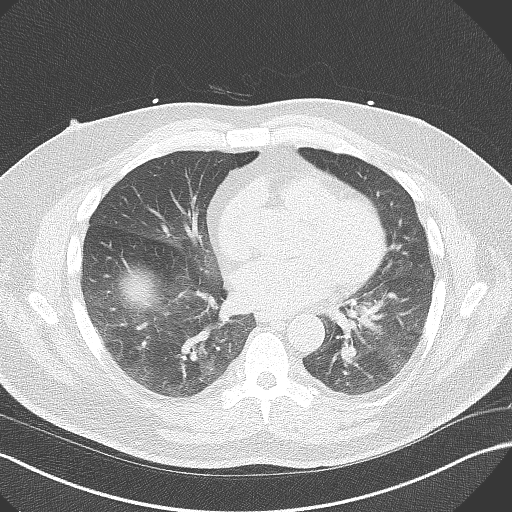
[im 29/35  lung]
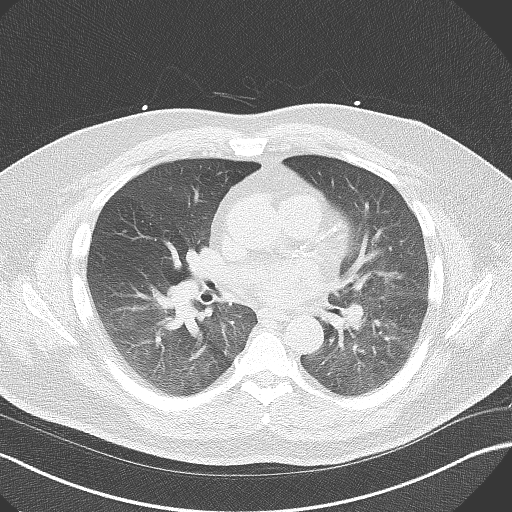

[Series 4: lung st 260 ms · axial · 0.75mm/px · z∈[-246,-177]mm · 5 of 35 slices shown]
[im 6/35  lung]
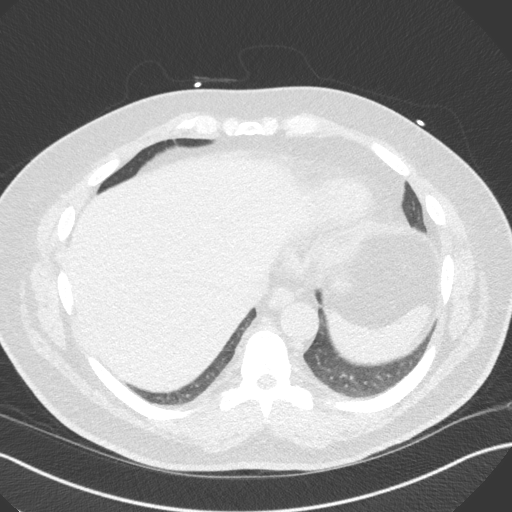
[im 12/35  lung]
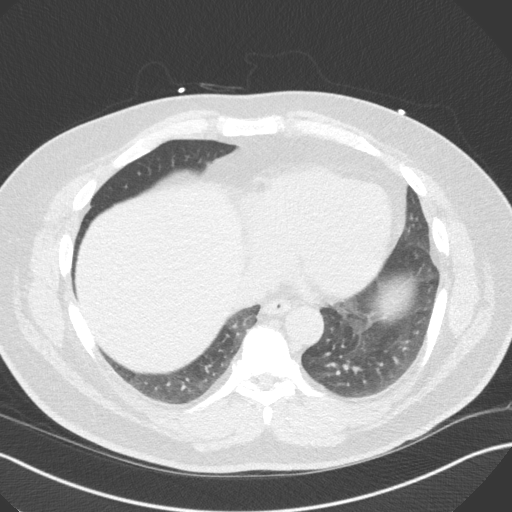
[im 18/35  lung]
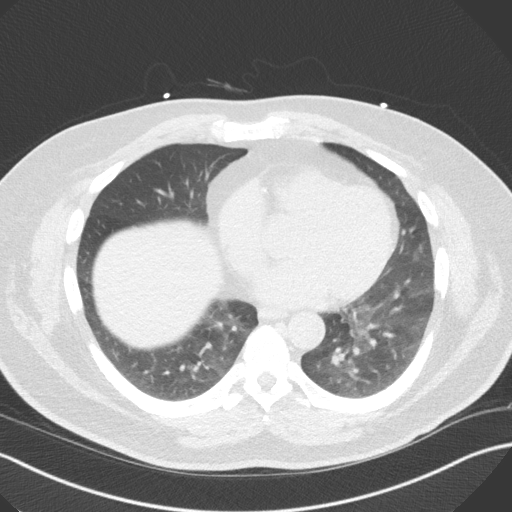
[im 23/35  lung]
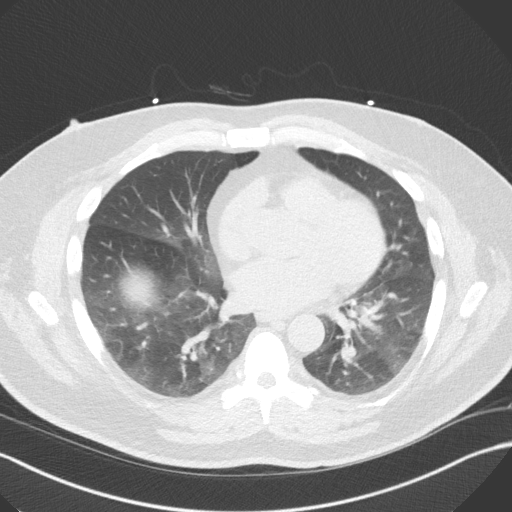
[im 29/35  lung]
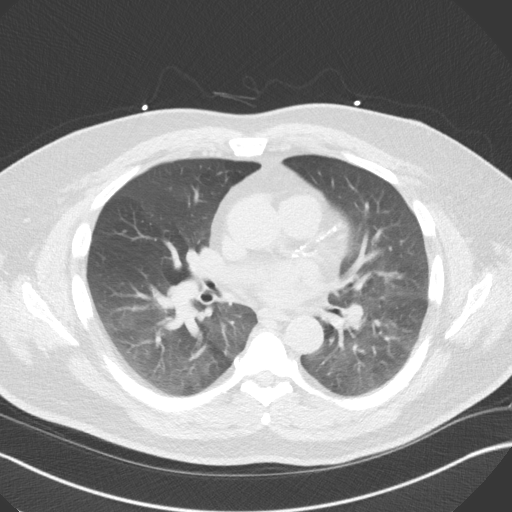

[14 of 20 positions shown; findings below may reference images not displayed]

FINDINGS: Within the visualized portions of the thorax there are no suspicious
appearing pulmonary nodules or masses, there is no acute
consolidative airspace disease, no pleural effusions, no
pneumothorax and no lymphadenopathy. Visualized portions of the
upper abdomen are unremarkable. There are no aggressive appearing
lytic or blastic lesions noted in the visualized portions of the
skeleton.
IMPRESSION: No significant incidental noncardiac findings are noted.
FINDINGS: Coronary arteries: Normal origins.

Coronary Calcium Score:

Left main: 29

Left anterior descending artery: 69

Left circumflex artery: 15

Right coronary artery: 10

Total: 123

Percentile: 61

Pericardium: Normal.

Ascending Aorta: Dilated ascending aorta measuring 40mm

Non-cardiac: See separate report from [REDACTED].
IMPRESSION: 1. Coronary calcium score of 123. This was 61st percentile for age-,
race-, and sex-matched controls.

2.  Dilated ascending aorta measuring 40mm



If CAC=0, it is reasonable to withhold statin therapy and reassess
in 5 to 10 years, as long as higher risk conditions are absent
(diabetes mellitus, family history of premature CHD in first degree
relatives (males <55 years; females <65 years), cigarette smoking,
or LDL >=190 mg/dL).

If CAC is 1 to 99, it is reasonable to initiate statin therapy for
patients >=55 years of age.

If CAC is >=100 or >=75th percentile, it is reasonable to initiate
statin therapy at any age.

Cardiology referral should be considered for patients with CAC
scores >=400 or >=75th percentile.

*2083 AHA/ACC/AACVPR/AAPA/ABC/MCCORMACK/THROWER/MONAHAN/Ronlor/BRZICA/ADENILDO/BANADERO
Guideline on the Management of Blood Cholesterol: A Report of the
American College of Cardiology/American Heart Association Task Force
on Clinical Practice Guidelines. J Am Coll Cardiol.
0671;73(24):3145-3016.

*** End of Addendum ***
EXAM:
OVER-READ INTERPRETATION  CT CHEST

The following report is an over-read performed by radiologist Dr.
Nazareth Jumper [REDACTED] on 05/13/2021. This
over-read does not include interpretation of cardiac or coronary
anatomy or pathology. The coronary calcium score interpretation by
the cardiologist is attached.
FINDINGS: Within the visualized portions of the thorax there are no suspicious
appearing pulmonary nodules or masses, there is no acute
consolidative airspace disease, no pleural effusions, no
pneumothorax and no lymphadenopathy. Visualized portions of the
upper abdomen are unremarkable. There are no aggressive appearing
lytic or blastic lesions noted in the visualized portions of the
skeleton.
IMPRESSION: No significant incidental noncardiac findings are noted.

## 2023-04-13 MED ORDER — AMPHETAMINE-DEXTROAMPHETAMINE 10 MG PO TABS: 10.0000 mg | ORAL_TABLET | Freq: Every day | ORAL | 0 refills | Status: DC

## 2023-04-13 NOTE — Telephone Encounter (Signed)
Last office visit was 12/02/22.  Okay to fill?

## 2023-04-23 ENCOUNTER — Other Ambulatory Visit: Payer: Self-pay | Admitting: Adult Health

## 2023-04-23 MED ORDER — AMPHETAMINE-DEXTROAMPHET ER 20 MG PO CP24: 20.0000 mg | ORAL_CAPSULE | Freq: Every day | ORAL | 0 refills | Status: DC

## 2023-05-08 ENCOUNTER — Ambulatory Visit
Admission: RE | Admit: 2023-05-08 | Discharge: 2023-05-08 | Disposition: A | Discharge status: Home / Self Care | Payer: BC Managed Care – PPO | Source: Ambulatory Visit | Attending: Internal Medicine | Admitting: Internal Medicine

## 2023-05-08 DIAGNOSIS — I77810 Thoracic aortic ectasia: Secondary | ICD-10-CM

## 2023-05-08 DIAGNOSIS — R911 Solitary pulmonary nodule: Secondary | ICD-10-CM

## 2023-05-08 MED ORDER — IOPAMIDOL (ISOVUE-370) INJECTION 76%
75.0000 mL | Freq: Once | INTRAVENOUS | Status: AC | PRN
  Administered 2023-05-08: 75 mL via INTRAVENOUS

## 2023-05-21 ENCOUNTER — Other Ambulatory Visit: Payer: Self-pay | Admitting: *Deleted

## 2023-05-21 ENCOUNTER — Other Ambulatory Visit: Payer: Self-pay | Admitting: Internal Medicine

## 2023-05-21 DIAGNOSIS — F900 Attention-deficit hyperactivity disorder, predominantly inattentive type: Secondary | ICD-10-CM

## 2023-05-21 MED ORDER — AMPHETAMINE-DEXTROAMPHET ER 20 MG PO CP24: 20.0000 mg | ORAL_CAPSULE | Freq: Every day | ORAL | 0 refills | Status: DC

## 2023-05-21 MED ORDER — AMPHETAMINE-DEXTROAMPHETAMINE 10 MG PO TABS: 10.0000 mg | ORAL_TABLET | Freq: Every day | ORAL | 0 refills | Status: DC

## 2023-05-21 MED ORDER — AMPHETAMINE-DEXTROAMPHETAMINE 10 MG PO TABS: 10.0000 mg | ORAL_TABLET | Freq: Every day | ORAL | 0 refills | Status: DC | PRN

## 2023-05-21 NOTE — Telephone Encounter (Signed)
Last office visit was 12/02/22.  Next office visit 07/29/23.  Okay to fill?

## 2023-05-21 NOTE — Telephone Encounter (Signed)
Prescription Request  05/21/2023  LOV: 07/23/2022  What is the name of the medication or equipment? Adderall  Have you contacted your pharmacy to request a refill? Yes   Which pharmacy would you like this sent to? [id number] [place], [place] - [address] [place] [address] Phone: [phone] Fax: [phone]   Patient notified that their request is being sent to the clinical staff for review and that they should receive a response within 2 business days.   Please advise at Mobile [phone] (mobile)

## 2023-05-26 ENCOUNTER — Other Ambulatory Visit: Payer: Self-pay | Admitting: *Deleted

## 2023-05-26 DIAGNOSIS — R911 Solitary pulmonary nodule: Secondary | ICD-10-CM

## 2023-06-22 ENCOUNTER — Other Ambulatory Visit: Payer: Self-pay | Admitting: Internal Medicine

## 2023-06-22 MED ORDER — AMPHETAMINE-DEXTROAMPHETAMINE 10 MG PO TABS: 10.0000 mg | ORAL_TABLET | Freq: Every day | ORAL | 0 refills | Status: DC

## 2023-06-22 MED ORDER — AMPHETAMINE-DEXTROAMPHET ER 20 MG PO CP24: 20.0000 mg | ORAL_CAPSULE | Freq: Every day | ORAL | 0 refills | Status: DC

## 2023-06-22 NOTE — Telephone Encounter (Signed)
Last office visit 12/02/22  Next office visit scheduled for 07/28/22

## 2023-07-23 ENCOUNTER — Other Ambulatory Visit: Payer: Self-pay | Admitting: Internal Medicine

## 2023-07-29 ENCOUNTER — Encounter: Payer: Self-pay | Admitting: Internal Medicine

## 2023-07-29 ENCOUNTER — Ambulatory Visit (INDEPENDENT_AMBULATORY_CARE_PROVIDER_SITE_OTHER): Payer: BC Managed Care – PPO | Admitting: Internal Medicine

## 2023-07-29 VITALS — BP 129/98 | HR 49 | Temp 97.8°F | Ht 67.5 in | Wt 240.1 lb

## 2023-07-29 DIAGNOSIS — Z23 Encounter for immunization: Secondary | ICD-10-CM | POA: Diagnosis not present

## 2023-07-29 DIAGNOSIS — Z0000 Encounter for general adult medical examination without abnormal findings: Secondary | ICD-10-CM | POA: Diagnosis not present

## 2023-07-29 DIAGNOSIS — Z125 Encounter for screening for malignant neoplasm of prostate: Secondary | ICD-10-CM

## 2023-07-29 DIAGNOSIS — E538 Deficiency of other specified B group vitamins: Secondary | ICD-10-CM

## 2023-07-29 DIAGNOSIS — E782 Mixed hyperlipidemia: Secondary | ICD-10-CM | POA: Diagnosis not present

## 2023-07-29 DIAGNOSIS — E559 Vitamin D deficiency, unspecified: Secondary | ICD-10-CM | POA: Diagnosis not present

## 2023-07-29 DIAGNOSIS — F320 Major depressive disorder, single episode, mild: Secondary | ICD-10-CM

## 2023-07-29 DIAGNOSIS — F900 Attention-deficit hyperactivity disorder, predominantly inattentive type: Secondary | ICD-10-CM

## 2023-07-29 LAB — LIPID PANEL
Cholesterol: 127 mg/dL (ref 0–200)
HDL: 40.9 mg/dL (ref >39.00)
LDL Cholesterol: 66 mg/dL (ref 0–99)
NonHDL: 85.65
Total CHOL/HDL Ratio: 3
Triglycerides: 99 mg/dL (ref 0.0–149.0)
VLDL: 19.8 mg/dL (ref 0.0–40.0)

## 2023-07-29 LAB — COMPREHENSIVE METABOLIC PANEL
ALT: 24 U/L (ref 0–53)
AST: 22 U/L (ref 0–37)
Albumin: 4.6 g/dL (ref 3.5–5.2)
Alkaline Phosphatase: 60 U/L (ref 39–117)
BUN: 16 mg/dL (ref 6–23)
CO2: 24 mEq/L (ref 19–32)
Calcium: 9.6 mg/dL (ref 8.4–10.5)
Chloride: 104 mEq/L (ref 96–112)
Creatinine, Ser: 1.42 mg/dL (ref 0.40–1.50)
GFR: 51.49 mL/min — ABNORMAL LOW (ref >60.00)
Glucose, Bld: 106 mg/dL — ABNORMAL HIGH (ref 70–99)
Potassium: 4.7 mEq/L (ref 3.5–5.1)
Sodium: 138 mEq/L (ref 135–145)
Total Bilirubin: 0.9 mg/dL (ref 0.2–1.2)
Total Protein: 7.3 g/dL (ref 6.0–8.3)

## 2023-07-29 LAB — VITAMIN B12: Vitamin B-12: 1087 pg/mL — ABNORMAL HIGH (ref 211–911)

## 2023-07-29 LAB — CBC WITH DIFFERENTIAL/PLATELET
Basophils Absolute: 0 10*3/uL (ref 0.0–0.1)
Basophils Relative: 0.4 % (ref 0.0–3.0)
Eosinophils Absolute: 0.3 10*3/uL (ref 0.0–0.7)
Eosinophils Relative: 3.3 % (ref 0.0–5.0)
HCT: 47.3 % (ref 39.0–52.0)
Hemoglobin: 16 g/dL (ref 13.0–17.0)
Lymphocytes Relative: 23.9 % (ref 12.0–46.0)
Lymphs Abs: 1.9 10*3/uL (ref 0.7–4.0)
MCHC: 33.7 g/dL (ref 30.0–36.0)
MCV: 89.1 fl (ref 78.0–100.0)
Monocytes Absolute: 0.6 10*3/uL (ref 0.1–1.0)
Monocytes Relative: 7.7 % (ref 3.0–12.0)
Neutro Abs: 5.2 10*3/uL (ref 1.4–7.7)
Neutrophils Relative %: 64.7 % (ref 43.0–77.0)
Platelets: 196 10*3/uL (ref 150.0–400.0)
RBC: 5.31 Mil/uL (ref 4.22–5.81)
RDW: 13.7 % (ref 11.5–15.5)
WBC: 8 10*3/uL (ref 4.0–10.5)

## 2023-07-29 LAB — TSH: TSH: 3.45 u[IU]/mL (ref 0.35–5.50)

## 2023-07-29 LAB — VITAMIN D 25 HYDROXY (VIT D DEFICIENCY, FRACTURES): VITD: 59.41 ng/mL (ref 30.00–100.00)

## 2023-07-29 LAB — PSA: PSA: 1.67 ng/mL (ref 0.10–4.00)

## 2023-07-29 NOTE — Progress Notes (Signed)
Established Patient Office Visit     CC/Reason for Visit: Annual preventive exam  HPI: Alan Tyler is a 67 y.o. male who is coming in today for the above mentioned reasons. Past Medical History is significant for: ADHD on Adderall, obesity, vitamin D and B12 deficiency, depression, hyperlipidemia.  He is feeling well and has no acute concerns.  He has routine eye and dental care.  He is due for pneumonia and tetanus vaccinations.  He had a colonoscopy in 2023.   Past Medical/Surgical History: Past Medical History:  Diagnosis Date   Allergy    SEASONAL   Anxiety    Arthritis    Cancer (HCC) 2003   kidney - 2003   Cataract    BILATERAL-REMOVED   Chronic kidney disease    kidney stones and (R) nephrectomy   Colon polyps    Depression    GERD (gastroesophageal reflux disease)    Hyperlipidemia    PVC's (premature ventricular contractions)    arrhythmia    Past Surgical History:  Procedure Laterality Date   CATARACT EXTRACTION     right eye   COLONOSCOPY  2014   EYE SURGERY     JOINT REPLACEMENT     NEPHRECTOMY  2003   (R) for cancer tumor   POLYPECTOMY     RETINAL DETACHMENT SURGERY  2012   TOTAL KNEE REPLACEMENTS Bilateral     Social History:  reports that he quit smoking about 21 years ago. His smoking use included cigarettes, pipe, and cigars. He quit smokeless tobacco use about 20 years ago. He reports current alcohol use of about 4.0 standard drinks of alcohol per week. He reports that he does not use drugs.  Allergies: Allergies  Allergen Reactions   Atorvastatin Other (See Comments)    Myalgia   Sulfa Drugs Cross Reactors     Per pt: unknown    Family History:  Family History  Problem Relation Age of Onset   Arthritis Mother    Depression Mother    Colon cancer Mother 38       part of colon was removed   Alcohol abuse Father    Heart disease Father    Hyperlipidemia Father    Hyperlipidemia Maternal Grandmother    Hyperlipidemia  Maternal Grandfather    Hyperlipidemia Paternal Grandmother    Diabetes Paternal Grandmother    Rectal cancer Paternal Grandmother    Arthritis Paternal Grandfather    Hyperlipidemia Paternal Grandfather    Stomach cancer Neg Hx    Esophageal cancer Neg Hx    Crohn's disease Neg Hx      Current Outpatient Medications:    amphetamine-dextroamphetamine (ADDERALL XR) 20 MG 24 hr capsule, Take 1 capsule (20 mg total) by mouth daily., Disp: 30 capsule, Rfl: 0   amphetamine-dextroamphetamine (ADDERALL XR) 20 MG 24 hr capsule, Take 1 capsule (20 mg total) by mouth daily., Disp: 30 capsule, Rfl: 0   amphetamine-dextroamphetamine (ADDERALL XR) 20 MG 24 hr capsule, Take 1 capsule (20 mg total) by mouth daily., Disp: 30 capsule, Rfl: 0   amphetamine-dextroamphetamine (ADDERALL) 10 MG tablet, Take 1 tablet (10 mg total) by mouth daily., Disp: 30 tablet, Rfl: 0   amphetamine-dextroamphetamine (ADDERALL) 10 MG tablet, Take 1 tablet (10 mg total) by mouth daily as needed., Disp: 30 tablet, Rfl: 0   amphetamine-dextroamphetamine (ADDERALL) 10 MG tablet, Take 1 tablet (10 mg total) by mouth daily., Disp: 30 tablet, Rfl: 0   buPROPion (WELLBUTRIN XL) 300 MG 24 hr  tablet, TAKE 1 TABLET DAILY, Disp: 90 tablet, Rfl: 1   Multiple Vitamin (ONE-A-DAY MENS PO), Take 1 tablet by mouth daily., Disp: , Rfl:    OVER THE COUNTER MEDICATION, NATURAL PSYLIUM HUSK FIBER 0.52 grams,(5Capsules) daily, Disp: , Rfl:    rosuvastatin (CRESTOR) 20 MG tablet, TAKE 1 TABLET DAILY, Disp: 90 tablet, Rfl: 2  Review of Systems:  Negative unless indicated in HPI.   Physical Exam: Vitals:   07/29/23 0808 07/29/23 0812  BP: (!) 140/90 (!) 129/98  Pulse: (!) 49   Temp: 97.8 F (36.6 C)   TempSrc: Oral   SpO2: 98%   Weight: 240 lb 1.6 oz (108.9 kg)   Height: 5' 7.5" (1.715 m)     Body mass index is 37.05 kg/m.   Physical Exam Vitals reviewed.  Constitutional:      General: He is not in acute distress.    Appearance:  Normal appearance. He is not ill-appearing, toxic-appearing or diaphoretic.  HENT:     Head: Normocephalic.     Right Ear: Tympanic membrane, ear canal and external ear normal. There is no impacted cerumen.     Left Ear: Tympanic membrane, ear canal and external ear normal. There is no impacted cerumen.     Nose: Nose normal.     Mouth/Throat:     Mouth: Mucous membranes are moist.     Pharynx: Oropharynx is clear. No oropharyngeal exudate or posterior oropharyngeal erythema.  Eyes:     General: No scleral icterus.       Right eye: No discharge.        Left eye: No discharge.     Conjunctiva/sclera: Conjunctivae normal.     Pupils: Pupils are equal, round, and reactive to light.  Neck:     Vascular: No carotid bruit.  Cardiovascular:     Rate and Rhythm: Normal rate and regular rhythm.     Pulses: Normal pulses.     Heart sounds: Normal heart sounds.  Pulmonary:     Effort: Pulmonary effort is normal. No respiratory distress.     Breath sounds: Normal breath sounds.  Abdominal:     General: Abdomen is flat. Bowel sounds are normal.     Palpations: Abdomen is soft.  Musculoskeletal:        General: Normal range of motion.     Cervical back: Normal range of motion.  Skin:    General: Skin is warm and dry.  Neurological:     General: No focal deficit present.     Mental Status: He is alert and oriented to person, place, and time. Mental status is at baseline.  Psychiatric:        Mood and Affect: Mood normal.        Behavior: Behavior normal.        Thought Content: Thought content normal.        Judgment: Judgment normal.     Flowsheet Row Office Visit from 07/29/2023 in [facility] [place]  PHQ-9 Total Score 0       Impression and Plan:  Encounter for preventive health examination -     PSA; Future  Vitamin B12 deficiency -     Vitamin B12; Future  Vitamin D deficiency -     VITAMIN D 25 Hydroxy (Vit-D Deficiency, Fractures);  Future  Mixed hyperlipidemia -     Lipid panel; Future  Current mild episode of major depressive disorder, unspecified whether recurrent (HCC)  Morbid obesity (HCC) -  CBC with Differential/Platelet; Future -     Comprehensive metabolic panel; Future -     TSH; Future  Attention deficit hyperactivity disorder (ADHD), predominantly inattentive type  Immunization due   -Recommend routine eye and dental care. -Healthy lifestyle discussed in detail. -Labs to be updated today. -Prostate cancer screening: PSA today Health Maintenance  Topic Date Due   COVID-19 Vaccine (4 - 2023-24 season) 08/29/2022   Flu Shot  07/30/2023   DTaP/Tdap/Td vaccine (3 - Tdap) 08/02/2023   Colon Cancer Screening  06/02/2029   Pneumonia Vaccine  Completed   Hepatitis C Screening  Completed   Zoster (Shingles) Vaccine  Completed   HPV Vaccine  Aged Out    -PCV 20 and Tdap administered in office today.     Chaya Jan, MD [facility]

## 2023-07-29 NOTE — Addendum Note (Signed)
Addended by: Philipp Deputy A on: 07/29/2023 08:52 AM   Modules accepted: Orders

## 2023-08-12 ENCOUNTER — Encounter (INDEPENDENT_AMBULATORY_CARE_PROVIDER_SITE_OTHER): Payer: BC Managed Care – PPO | Admitting: Ophthalmology

## 2023-08-24 ENCOUNTER — Other Ambulatory Visit: Payer: Self-pay | Admitting: Internal Medicine

## 2023-08-24 DIAGNOSIS — F900 Attention-deficit hyperactivity disorder, predominantly inattentive type: Secondary | ICD-10-CM

## 2023-08-24 NOTE — Telephone Encounter (Signed)
Patient had an appointment 07/29/23

## 2023-08-25 MED ORDER — AMPHETAMINE-DEXTROAMPHETAMINE 10 MG PO TABS: 10.0000 mg | ORAL_TABLET | Freq: Every day | ORAL | 0 refills | Status: DC

## 2023-08-25 MED ORDER — AMPHETAMINE-DEXTROAMPHET ER 20 MG PO CP24: 20.0000 mg | ORAL_CAPSULE | Freq: Every day | ORAL | 0 refills | Status: DC

## 2023-08-25 MED ORDER — AMPHETAMINE-DEXTROAMPHETAMINE 10 MG PO TABS
10.0000 mg | ORAL_TABLET | Freq: Every day | ORAL | 0 refills | Status: DC | PRN
Start: 2023-08-25 — End: 2024-01-05

## 2023-08-25 MED ORDER — AMPHETAMINE-DEXTROAMPHET ER 20 MG PO CP24
20.0000 mg | ORAL_CAPSULE | Freq: Every day | ORAL | 0 refills | Status: DC
Start: 2023-08-25 — End: 2024-01-05

## 2023-09-08 DIAGNOSIS — H35372 Puckering of macula, left eye: Secondary | ICD-10-CM | POA: Diagnosis not present

## 2023-09-08 DIAGNOSIS — H2702 Aphakia, left eye: Secondary | ICD-10-CM | POA: Diagnosis not present

## 2023-09-08 DIAGNOSIS — H35352 Cystoid macular degeneration, left eye: Secondary | ICD-10-CM | POA: Diagnosis not present

## 2023-09-21 ENCOUNTER — Other Ambulatory Visit: Payer: Self-pay | Admitting: Internal Medicine

## 2023-09-21 ENCOUNTER — Other Ambulatory Visit (HOSPITAL_BASED_OUTPATIENT_CLINIC_OR_DEPARTMENT_OTHER): Payer: Self-pay

## 2023-09-21 DIAGNOSIS — H35352 Cystoid macular degeneration, left eye: Secondary | ICD-10-CM | POA: Diagnosis not present

## 2023-09-21 DIAGNOSIS — H35372 Puckering of macula, left eye: Secondary | ICD-10-CM | POA: Diagnosis not present

## 2023-09-21 DIAGNOSIS — H2702 Aphakia, left eye: Secondary | ICD-10-CM | POA: Diagnosis not present

## 2023-09-21 DIAGNOSIS — Z961 Presence of intraocular lens: Secondary | ICD-10-CM | POA: Diagnosis not present

## 2023-09-21 MED ORDER — ROSUVASTATIN CALCIUM 20 MG PO TABS
20.0000 mg | ORAL_TABLET | Freq: Every day | ORAL | 2 refills | Status: DC
  Filled 2023-09-21: qty 90, 90d supply, fill #0
  Filled 2023-12-19 (×2): qty 90, 90d supply, fill #1
  Filled 2024-03-15: qty 90, 90d supply, fill #2

## 2023-09-21 MED ORDER — BUPROPION HCL ER (XL) 300 MG PO TB24
300.0000 mg | ORAL_TABLET | Freq: Every day | ORAL | 1 refills | Status: DC
  Filled 2023-09-21: qty 90, 90d supply, fill #0
  Filled 2023-12-19 (×2): qty 90, 90d supply, fill #1

## 2023-09-24 ENCOUNTER — Other Ambulatory Visit: Payer: Self-pay | Admitting: Internal Medicine

## 2023-09-28 NOTE — Telephone Encounter (Signed)
Dr Ardyth Harps is out of the office until 10/05/23. Do you mind filling these?

## 2023-09-28 NOTE — Telephone Encounter (Signed)
-----   Message from Chaya Jan sent at 09/25/2023 10:17 AM EDT ----- Can you pend these please? ----- Message ----- From: Farrel Conners, [facility] Sent: 09/21/2023   3:58 PM EDT To: Henderson Cloud, MD  Hi!  This patient is wanting to get all of his prescriptions filled at [facility], but currently has Adderall prescriptions at [facility]. I unfortunately cannot transfer those, so could you please send new prescriptions for [place] to place on file?   Thanks so much!

## 2023-09-29 ENCOUNTER — Other Ambulatory Visit (HOSPITAL_BASED_OUTPATIENT_CLINIC_OR_DEPARTMENT_OTHER): Payer: Self-pay

## 2023-09-29 MED ORDER — AMPHETAMINE-DEXTROAMPHETAMINE 10 MG PO TABS
10.0000 mg | ORAL_TABLET | Freq: Every day | ORAL | 0 refills | Status: DC
  Filled 2023-09-29: qty 30, 30d supply, fill #0

## 2023-09-29 MED ORDER — AMPHETAMINE-DEXTROAMPHET ER 20 MG PO CP24
20.0000 mg | ORAL_CAPSULE | Freq: Every day | ORAL | 0 refills | Status: DC
  Filled 2023-09-29: qty 30, 30d supply, fill #0

## 2023-09-29 NOTE — Telephone Encounter (Signed)
Done

## 2023-10-29 ENCOUNTER — Other Ambulatory Visit (HOSPITAL_BASED_OUTPATIENT_CLINIC_OR_DEPARTMENT_OTHER): Payer: Self-pay

## 2023-10-29 ENCOUNTER — Other Ambulatory Visit: Payer: Self-pay | Admitting: Family Medicine

## 2023-10-29 MED ORDER — AMPHETAMINE-DEXTROAMPHETAMINE 10 MG PO TABS
10.0000 mg | ORAL_TABLET | Freq: Every day | ORAL | 0 refills | Status: DC
  Filled 2023-10-29: qty 30, 30d supply, fill #0

## 2023-10-29 MED ORDER — AMPHETAMINE-DEXTROAMPHET ER 20 MG PO CP24
20.0000 mg | ORAL_CAPSULE | Freq: Every day | ORAL | 0 refills | Status: DC
  Filled 2023-10-29: qty 30, 30d supply, fill #0

## 2023-10-29 NOTE — Telephone Encounter (Signed)
Pt LOV was on 07/29/23 Last refill was done on 09/29/23 by Dr Clent Ridges Please advise

## 2023-11-27 ENCOUNTER — Other Ambulatory Visit: Payer: Self-pay | Admitting: Internal Medicine

## 2023-11-30 ENCOUNTER — Other Ambulatory Visit (HOSPITAL_BASED_OUTPATIENT_CLINIC_OR_DEPARTMENT_OTHER): Payer: Self-pay

## 2023-11-30 ENCOUNTER — Other Ambulatory Visit: Payer: Self-pay

## 2023-11-30 MED ORDER — AMPHETAMINE-DEXTROAMPHET ER 20 MG PO CP24
20.0000 mg | ORAL_CAPSULE | Freq: Every day | ORAL | 0 refills | Status: DC
  Filled 2023-11-30: qty 30, 30d supply, fill #0

## 2023-11-30 MED ORDER — AMPHETAMINE-DEXTROAMPHETAMINE 10 MG PO TABS
10.0000 mg | ORAL_TABLET | Freq: Every day | ORAL | 0 refills | Status: DC
  Filled 2023-11-30: qty 30, 30d supply, fill #0

## 2023-12-01 DIAGNOSIS — Z0100 Encounter for examination of eyes and vision without abnormal findings: Secondary | ICD-10-CM | POA: Diagnosis not present

## 2023-12-19 ENCOUNTER — Other Ambulatory Visit (HOSPITAL_BASED_OUTPATIENT_CLINIC_OR_DEPARTMENT_OTHER): Payer: Self-pay

## 2023-12-28 ENCOUNTER — Ambulatory Visit: Payer: Self-pay | Admitting: Internal Medicine

## 2023-12-28 DIAGNOSIS — F900 Attention-deficit hyperactivity disorder, predominantly inattentive type: Secondary | ICD-10-CM

## 2023-12-28 NOTE — Telephone Encounter (Signed)
Chief Complaint: URI symptoms Symptoms: cough, congestion Frequency: Since Christmas Day Pertinent Negatives: Patient denies fever Disposition: [] ED /[] Urgent Care (no appt availability in office) / [x] Appointment(In office/virtual)/ []  [facility]/ [] Home Care/ [] Refused Recommended Disposition /[] [facility]/ []  Follow-up with PCP Additional Notes: patient c/o URI symptoms along with needing a refill on his Adderall.  Patient states he has had symptoms since Christmas Day. No fever or SOB. Patient requesting an appointment. Per protocol, appointment made for Jan 01, 2024. All questions answered. Patient verbalized understanding of plan  Copied from CRM [id number]. Topic: Clinical - Red Word Triage >> Dec 28, 2023  3:03 PM Gurney Maxin H wrote: Red Word that prompted transfer to Nurse Triage: Cold that started in the head stuffy nose, congestion moved to chest ,very weak can't sleep at night, runny nose, really bad headache and sore throat. Reason for Disposition  Caller requesting a CONTROLLED substance prescription refill (e.g., narcotics, ADHD medicines)  [1] Nasal discharge AND [2] present > 10 days  Answer Assessment - Initial Assessment Questions 1. ONSET: "When did the cough begin?"      Christmas Day 2. SEVERITY: "How bad is the cough today?"      Cough hasn't been bad but tends to get worse at night 3. SPUTUM: "Describe the color of your sputum" (none, dry cough; clear, white, yellow, green)     Dry cough 4. HEMOPTYSIS: "Are you coughing up any blood?" If so ask: "How much?" (flecks, streaks, tablespoons, etc.)     no 5. DIFFICULTY BREATHING: "Are you having difficulty breathing?" If Yes, ask: "How bad is it?" (e.g., mild, moderate, severe)    - MILD: No SOB at rest, mild SOB with walking, speaks normally in sentences, can lie down, no retractions, pulse < 100.    - MODERATE: SOB at rest, SOB with minimal exertion and prefers to sit, cannot lie down flat, speaks  in phrases, mild retractions, audible wheezing, pulse 100-120.    - SEVERE: Very SOB at rest, speaks in single words, struggling to breathe, sitting hunched forward, retractions, pulse > 120      No 6. FEVER: "Do you have a fever?" If Yes, ask: "What is your temperature, how was it measured, and when did it start?"     No 7. CARDIAC HISTORY: "Do you have any history of heart disease?" (e.g., heart attack, congestive heart failure)      CAD 8. LUNG HISTORY: "Do you have any history of lung disease?"  (e.g., pulmonary embolus, asthma, emphysema)     No 9. PE RISK FACTORS: "Do you have a history of blood clots?" (or: recent major surgery, recent prolonged travel, bedridden)     NO 10. OTHER SYMPTOMS: "Do you have any other symptoms?" (e.g., runny nose, wheezing, chest pain)       Runny nose, wheezing,  12. TRAVEL: "Have you traveled out of the country in the last month?" (e.g., travel history, exposures)       NO  Answer Assessment - Initial Assessment Questions 1. DRUG NAME: "What medicine do you need to have refilled?"     Adderall 2. REFILLS REMAINING: "How many refills are remaining?" (Note: The label on the medicine or pill bottle will show how many refills are remaining. If there are no refills remaining, then a renewal may be needed.)     0 3. EXPIRATION DATE: "What is the expiration date?" (Note: The label states when the prescription will expire, and thus can no longer be refilled.)  01/02/2024 4. PRESCRIBING HCP: "Who prescribed it?" Reason: If prescribed by specialist, call should be referred to that group.     Philip Aspen 5. SYMPTOMS: "Do you have any symptoms?"     No symptoms-just wanting a refill 6. PREGNANCY: "Is there any chance that you are pregnant?" "When was your last menstrual period?"     *No Answer*  Protocols used: Cough - Acute Non-Productive-A-AH, Medication Refill and Renewal Call-A-AH

## 2024-01-01 ENCOUNTER — Ambulatory Visit: Payer: Medicare HMO | Admitting: Family Medicine

## 2024-01-05 MED ORDER — AMPHETAMINE-DEXTROAMPHETAMINE 10 MG PO TABS: 10.0000 mg | ORAL_TABLET | Freq: Every day | ORAL | 0 refills | Status: DC | PRN

## 2024-01-05 MED ORDER — AMPHETAMINE-DEXTROAMPHETAMINE 10 MG PO TABS: 10.0000 mg | ORAL_TABLET | Freq: Every day | ORAL | 0 refills | Status: DC

## 2024-01-05 MED ORDER — AMPHETAMINE-DEXTROAMPHET ER 20 MG PO CP24: 20.0000 mg | ORAL_CAPSULE | Freq: Every day | ORAL | 0 refills | Status: DC

## 2024-01-05 NOTE — Telephone Encounter (Signed)
 Okay to pend refills of Adderall?

## 2024-01-05 NOTE — Addendum Note (Signed)
 Addended by: Kern Reap B on: 01/05/2024 01:24 PM   Modules accepted: Orders

## 2024-01-05 NOTE — Addendum Note (Signed)
 Addended by: Chaya Jan Y on: 01/05/2024 01:48 PM   Modules accepted: Orders

## 2024-01-05 NOTE — Telephone Encounter (Signed)
 Patient scheduled an appointment for 01/06/24.

## 2024-01-06 ENCOUNTER — Other Ambulatory Visit (HOSPITAL_BASED_OUTPATIENT_CLINIC_OR_DEPARTMENT_OTHER): Payer: Self-pay

## 2024-01-06 ENCOUNTER — Encounter: Payer: Self-pay | Admitting: Internal Medicine

## 2024-01-06 ENCOUNTER — Other Ambulatory Visit: Payer: Self-pay

## 2024-01-06 ENCOUNTER — Ambulatory Visit (INDEPENDENT_AMBULATORY_CARE_PROVIDER_SITE_OTHER): Payer: Medicare HMO | Admitting: Internal Medicine

## 2024-01-06 VITALS — BP 110/80 | HR 98 | Temp 98.7°F | Wt 258.7 lb

## 2024-01-06 DIAGNOSIS — F900 Attention-deficit hyperactivity disorder, predominantly inattentive type: Secondary | ICD-10-CM | POA: Diagnosis not present

## 2024-01-06 DIAGNOSIS — J069 Acute upper respiratory infection, unspecified: Secondary | ICD-10-CM

## 2024-01-06 MED ORDER — AMPHETAMINE-DEXTROAMPHETAMINE 10 MG PO TABS
10.0000 mg | ORAL_TABLET | Freq: Every day | ORAL | 0 refills | Status: DC | PRN
  Filled 2024-01-06 – 2024-02-08 (×2): qty 30, 30d supply, fill #0

## 2024-01-06 MED ORDER — AMPHETAMINE-DEXTROAMPHET ER 20 MG PO CP24
20.0000 mg | ORAL_CAPSULE | Freq: Every day | ORAL | 0 refills | Status: DC
  Filled 2024-01-06 – 2024-01-07 (×2): qty 30, 30d supply, fill #0

## 2024-01-06 MED ORDER — AMPHETAMINE-DEXTROAMPHET ER 20 MG PO CP24
20.0000 mg | ORAL_CAPSULE | Freq: Every day | ORAL | 0 refills | Status: DC
  Filled 2024-01-06 – 2024-03-09 (×2): qty 30, 30d supply, fill #0

## 2024-01-06 MED ORDER — AMPHETAMINE-DEXTROAMPHETAMINE 10 MG PO TABS
10.0000 mg | ORAL_TABLET | Freq: Every day | ORAL | 0 refills | Status: DC
  Filled 2024-01-06: qty 30, 30d supply, fill #0

## 2024-01-06 MED ORDER — AMPHETAMINE-DEXTROAMPHET ER 20 MG PO CP24
20.0000 mg | ORAL_CAPSULE | Freq: Every day | ORAL | 0 refills | Status: DC
  Filled 2024-01-06 – 2024-02-08 (×2): qty 30, 30d supply, fill #0

## 2024-01-06 MED ORDER — AMPHETAMINE-DEXTROAMPHETAMINE 10 MG PO TABS
10.0000 mg | ORAL_TABLET | Freq: Every day | ORAL | 0 refills | Status: DC
  Filled 2024-01-06 – 2024-04-18 (×2): qty 30, 30d supply, fill #0

## 2024-01-06 NOTE — Progress Notes (Signed)
 Established Patient Office Visit     CC/Reason for Visit: Medication refills, URI symptoms  HPI: Alan Tyler is a 68 y.o. male who is coming in today for the above mentioned reasons. Past Medical History is significant for: ADHD. His adderall was sent to the wrong pharmacy. He has not yet picked it up and requests it being sent to the correct one. For 14 days has been having head and nasal congestion, PND, rhinorrhea, sore throat and a dry cough. He hosted a party at his house over [employer] with 54 people.   Past Medical/Surgical History: Past Medical History:  Diagnosis Date   Allergy    SEASONAL   Anxiety    Arthritis    Cancer (HCC) 2003   kidney - 2003   Cataract    BILATERAL-REMOVED   Chronic kidney disease    kidney stones and (R) nephrectomy   Colon polyps    Depression    GERD (gastroesophageal reflux disease)    Hyperlipidemia    PVC's (premature ventricular contractions)    arrhythmia    Past Surgical History:  Procedure Laterality Date   CATARACT EXTRACTION     right eye   COLONOSCOPY  2014   EYE SURGERY     JOINT REPLACEMENT     NEPHRECTOMY  2003   (R) for cancer tumor   POLYPECTOMY     RETINAL DETACHMENT SURGERY  2012   TOTAL KNEE REPLACEMENTS Bilateral     Social History:  reports that he quit smoking about 22 years ago. His smoking use included cigarettes, pipe, and cigars. He quit smokeless tobacco use about 21 years ago. He reports current alcohol use of about 4.0 standard drinks of alcohol per week. He reports that he does not use drugs.  Allergies: Allergies  Allergen Reactions   Atorvastatin  Other (See Comments)    Myalgia   Sulfa Drugs Cross Reactors     Per pt: unknown    Family History:  Family History  Problem Relation Age of Onset   Arthritis Mother    Depression Mother    Colon cancer Mother 42       part of colon was removed   Alcohol abuse Father    Heart disease Father    Hyperlipidemia Father     Hyperlipidemia Maternal Grandmother    Hyperlipidemia Maternal Grandfather    Hyperlipidemia Paternal Grandmother    Diabetes Paternal Grandmother    Rectal cancer Paternal Grandmother    Arthritis Paternal Grandfather    Hyperlipidemia Paternal Grandfather    Stomach cancer Neg Hx    Esophageal cancer Neg Hx    Crohn's disease Neg Hx      Current Outpatient Medications:    [START ON 02/05/2024] amphetamine -dextroamphetamine  (ADDERALL XR) 20 MG 24 hr capsule, Take 1 capsule (20 mg total) by mouth daily., Disp: 30 capsule, Rfl: 0   amphetamine -dextroamphetamine  (ADDERALL XR) 20 MG 24 hr capsule, Take 1 capsule (20 mg total) by mouth daily., Disp: 30 capsule, Rfl: 0   [START ON 03/06/2024] amphetamine -dextroamphetamine  (ADDERALL XR) 20 MG 24 hr capsule, Take 1 capsule (20 mg total) by mouth daily., Disp: 30 capsule, Rfl: 0   [START ON 02/05/2024] amphetamine -dextroamphetamine  (ADDERALL) 10 MG tablet, Take 1 tablet (10 mg total) by mouth daily as needed., Disp: 30 tablet, Rfl: 0   amphetamine -dextroamphetamine  (ADDERALL) 10 MG tablet, Take 1 tablet (10 mg total) by mouth daily., Disp: 30 tablet, Rfl: 0   [START ON 03/06/2024] amphetamine -dextroamphetamine  (ADDERALL) 10 MG  tablet, Take 1 tablet (10 mg total) by mouth daily., Disp: 30 tablet, Rfl: 0   buPROPion  (WELLBUTRIN  XL) 300 MG 24 hr tablet, TAKE 1 TABLET DAILY, Disp: 90 tablet, Rfl: 1   Multiple Vitamin (ONE-A-DAY MENS PO), Take 1 tablet by mouth daily., Disp: , Rfl:    OVER THE COUNTER MEDICATION, NATURAL PSYLIUM HUSK FIBER 0.52 grams,(5Capsules) daily, Disp: , Rfl:    rosuvastatin  (CRESTOR ) 20 MG tablet, Take 1 tablet (20 mg total) by mouth daily., Disp: 90 tablet, Rfl: 2  Review of Systems:  Negative unless indicated in HPI.   Physical Exam: Vitals:   01/06/24 1123  BP: 110/80  Pulse: 98  Temp: 98.7 F (37.1 C)  TempSrc: Oral  SpO2: 99%  Weight: 258 lb 11.2 oz (117.3 kg)    Body mass index is 39.92 kg/m.   Physical  Exam Vitals reviewed.  Constitutional:      Appearance: Normal appearance.  HENT:     Right Ear: Ear canal and external ear normal. A middle ear effusion is present.     Left Ear: Ear canal and external ear normal. A middle ear effusion is present.     Mouth/Throat:     Mouth: Mucous membranes are moist.     Pharynx: Posterior oropharyngeal erythema present.  Eyes:     Conjunctiva/sclera: Conjunctivae normal.     Pupils: Pupils are equal, round, and reactive to light.  Cardiovascular:     Rate and Rhythm: Normal rate and regular rhythm.  Pulmonary:     Effort: Pulmonary effort is normal.     Breath sounds: Normal breath sounds.  Neurological:     Mental Status: He is alert.      Impression and Plan:  Viral upper respiratory tract infection  Attention deficit hyperactivity disorder (ADHD), predominantly inattentive type -     Amphetamine -Dextroamphet ER; Take 1 capsule (20 mg total) by mouth daily.  Dispense: 30 capsule; Refill: 0 -     Amphetamine -Dextroamphet ER; Take 1 capsule (20 mg total) by mouth daily.  Dispense: 30 capsule; Refill: 0 -     Amphetamine -Dextroamphet ER; Take 1 capsule (20 mg total) by mouth daily.  Dispense: 30 capsule; Refill: 0 -     Amphetamine -Dextroamphetamine ; Take 1 tablet (10 mg total) by mouth daily as needed.  Dispense: 30 tablet; Refill: 0 -     Amphetamine -Dextroamphetamine ; Take 1 tablet (10 mg total) by mouth daily.  Dispense: 30 tablet; Refill: 0 -     Amphetamine -Dextroamphetamine ; Take 1 tablet (10 mg total) by mouth daily.  Dispense: 30 tablet; Refill: 0  -Given exam findings, PNA, pharyngitis, ear infection are not likely, hence abx have not been prescribed. -Have advised rest, fluids, OTC antihistamines, cough suppressants and mucinex. -RTC if no improvement in 10-14 days.  -Adderall resent to pharmacy of choice.   Time spent:30 minutes reviewing chart, interviewing and examining patient and formulating plan of care.     Tully Theophilus Andrews, MD [facility]

## 2024-01-07 ENCOUNTER — Other Ambulatory Visit (HOSPITAL_BASED_OUTPATIENT_CLINIC_OR_DEPARTMENT_OTHER): Payer: Self-pay

## 2024-02-08 ENCOUNTER — Other Ambulatory Visit (HOSPITAL_BASED_OUTPATIENT_CLINIC_OR_DEPARTMENT_OTHER): Payer: Self-pay

## 2024-02-08 MED ORDER — COMIRNATY 30 MCG/0.3ML IM SUSY
0.3000 mL | PREFILLED_SYRINGE | Freq: Once | INTRAMUSCULAR | 0 refills | Status: AC
  Filled 2024-02-08: qty 0.3, 1d supply, fill #0

## 2024-02-08 MED ORDER — FLUAD 0.5 ML IM SUSY
0.5000 mL | PREFILLED_SYRINGE | Freq: Once | INTRAMUSCULAR | 0 refills | Status: AC
  Filled 2024-02-08: qty 0.5, 1d supply, fill #0

## 2024-03-08 DIAGNOSIS — Z9889 Other specified postprocedural states: Secondary | ICD-10-CM | POA: Diagnosis not present

## 2024-03-08 DIAGNOSIS — H35352 Cystoid macular degeneration, left eye: Secondary | ICD-10-CM | POA: Diagnosis not present

## 2024-03-08 DIAGNOSIS — H35372 Puckering of macula, left eye: Secondary | ICD-10-CM | POA: Diagnosis not present

## 2024-03-08 DIAGNOSIS — H2702 Aphakia, left eye: Secondary | ICD-10-CM | POA: Diagnosis not present

## 2024-03-09 ENCOUNTER — Other Ambulatory Visit: Payer: Self-pay | Admitting: Internal Medicine

## 2024-03-09 ENCOUNTER — Other Ambulatory Visit (HOSPITAL_BASED_OUTPATIENT_CLINIC_OR_DEPARTMENT_OTHER): Payer: Self-pay

## 2024-03-09 DIAGNOSIS — F900 Attention-deficit hyperactivity disorder, predominantly inattentive type: Secondary | ICD-10-CM

## 2024-03-15 ENCOUNTER — Other Ambulatory Visit: Payer: Self-pay | Admitting: Internal Medicine

## 2024-03-15 ENCOUNTER — Other Ambulatory Visit (HOSPITAL_BASED_OUTPATIENT_CLINIC_OR_DEPARTMENT_OTHER): Payer: Self-pay

## 2024-03-15 MED ORDER — BUPROPION HCL ER (XL) 300 MG PO TB24
300.0000 mg | ORAL_TABLET | Freq: Every day | ORAL | 1 refills | Status: DC
  Filled 2024-03-15: qty 90, 90d supply, fill #0
  Filled 2024-07-03: qty 90, 90d supply, fill #1

## 2024-04-04 IMAGING — CT CT ANGIO CHEST
2 of 7 series · 16 of 36 positions shown · IV contrast (agent unspecified)
Comparison: Coronary calcium scoring CT done on 05/14/2039

CLINICAL DATA: Ectasia of thoracic aorta seen in the previous CT

EXAM:
CT ANGIOGRAPHY CHEST WITH CONTRAST
TECHNIQUE: Multidetector CT imaging of the chest was performed using the
standard protocol during bolus administration of intravenous
contrast. Multiplanar CT image reconstructions and MIPs were
obtained to evaluate the vascular anatomy.

[Series 8: cta thorax 1.00 bv36 s3 arterial thins · axial · arterial · 0.93mm/px · z∈[+1494,+1815]mm · 15 of 613 slices shown]
[im 39/613  lung]
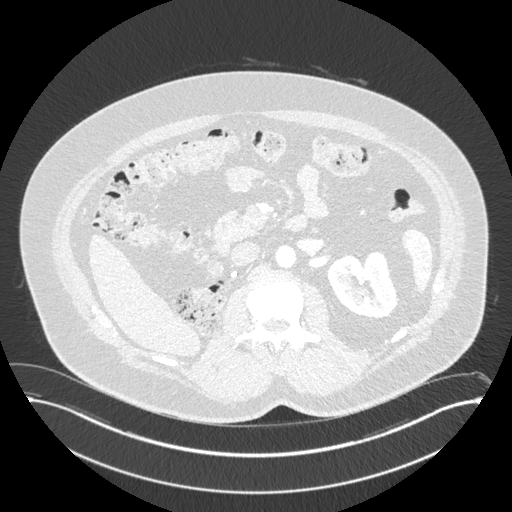
[im 77/613  mediastinal]
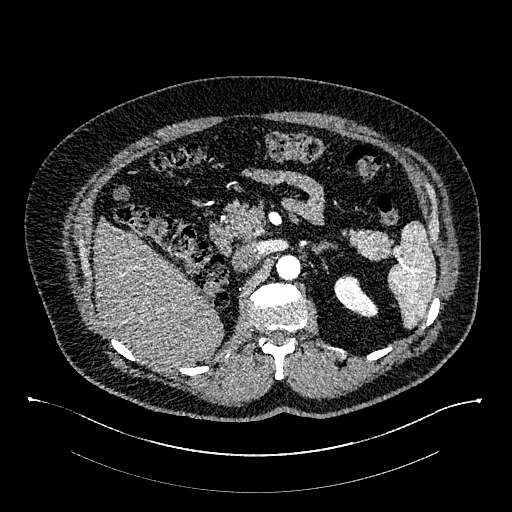
[im 115/613  lung]
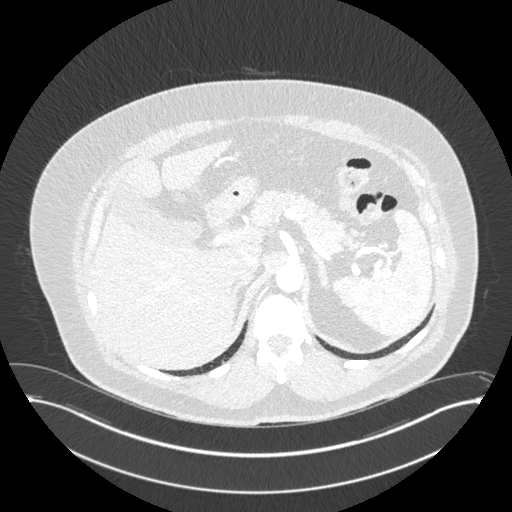
[im 154/613  mediastinal]
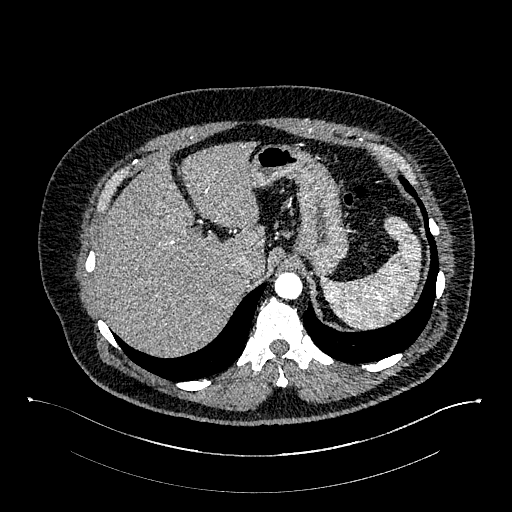
[im 192/613  lung]
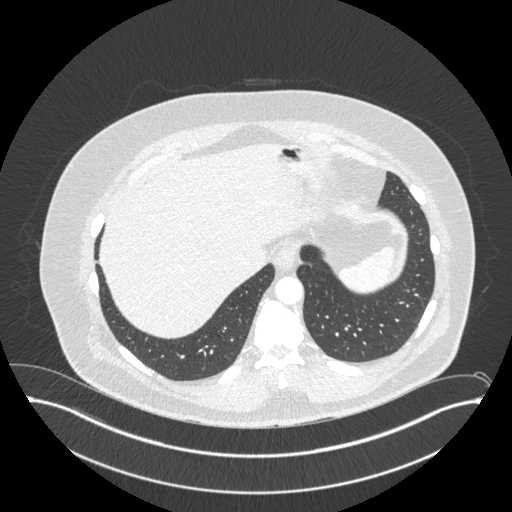
[im 230/613  mediastinal]
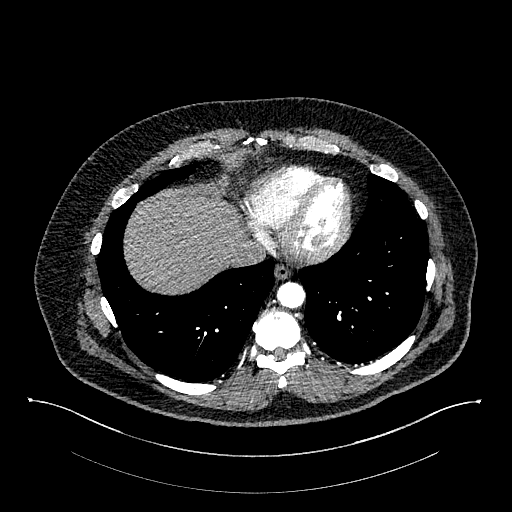
[im 268/613  lung]
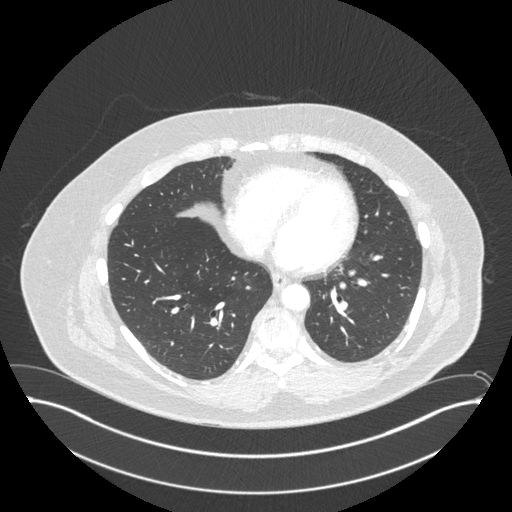
[im 307/613  mediastinal]
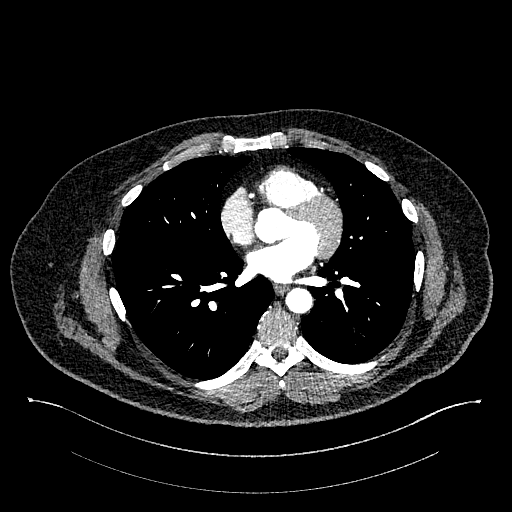
[im 345/613  lung]
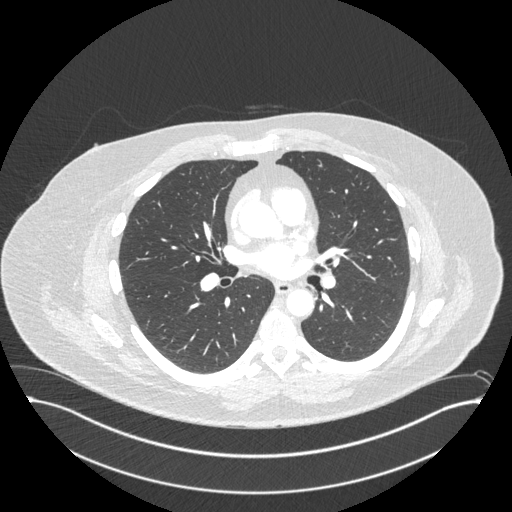
[im 383/613  mediastinal]
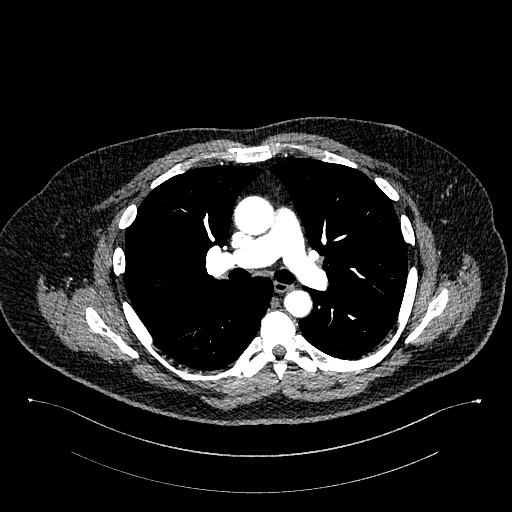
[im 421/613  lung]
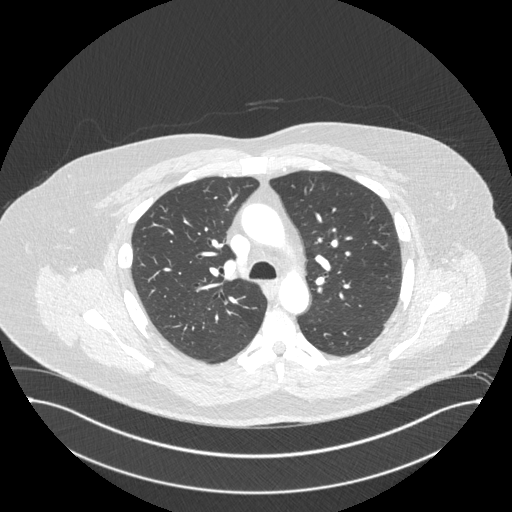
[im 460/613  mediastinal]
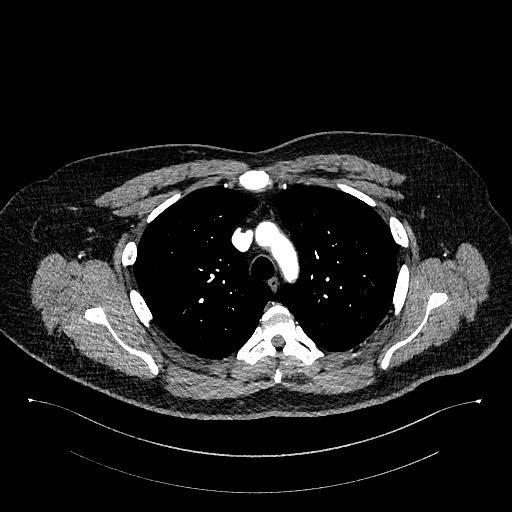
[im 498/613  lung]
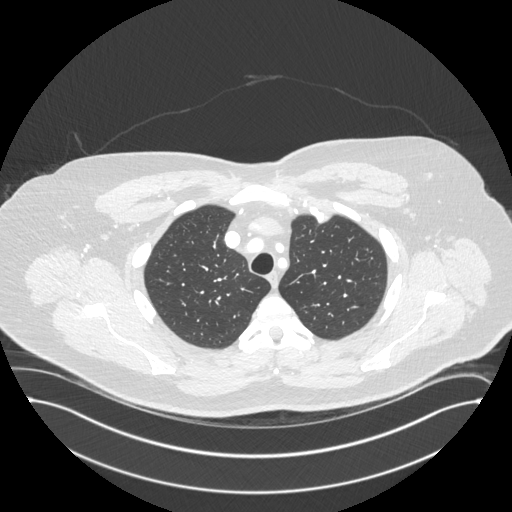
[im 536/613  mediastinal]
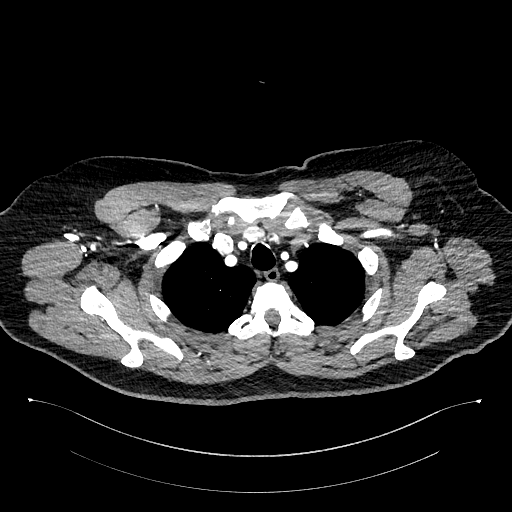
[im 574/613  lung]
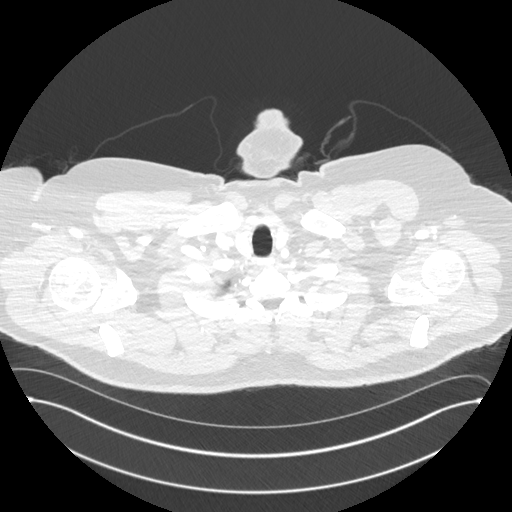

[Series 9: cta thorax 2.00 bv36 s3 cor st · coronal · 0.73mm/px · 1 of 172 slices shown]
[im 86/172  mediastinal]
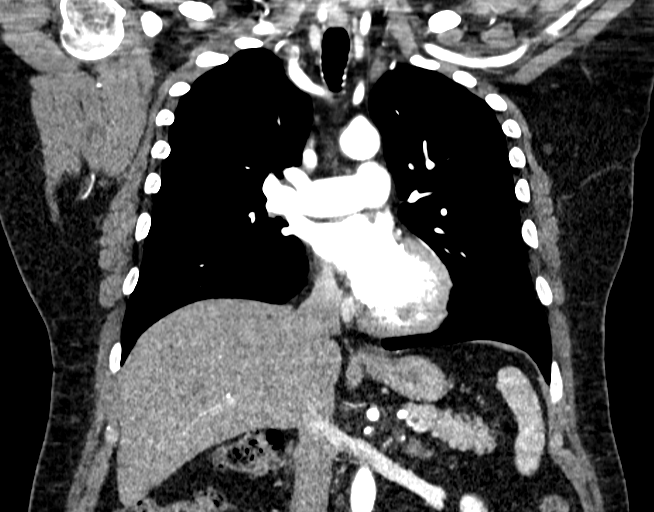

[16 of 36 positions shown; findings below may reference images not displayed]

RADIATION DOSE REDUCTION: This exam was performed according to the
departmental dose-optimization program which includes automated
exposure control, adjustment of the mA and/or kV according to
patient size and/or use of iterative reconstruction technique.

CONTRAST:  75mL UKYEMY-NDZ IOPAMIDOL (UKYEMY-NDZ) INJECTION 76%
FINDINGS: Cardiovascular: There is homogeneous enhancement in thoracic aorta.
Ascending thoracic aorta measures up to 3.7 cm in diameter. Aortic
arch measures 2.6 cm. Descending thoracic aorta measures 2.5 cm.
There are scattered coronary artery calcifications. There are no
filling defects in the central pulmonary artery branches.

Mediastinum/Nodes: No significant lymphadenopathy is seen.

Lungs/Pleura: There is no focal pulmonary consolidation. In the
image 65 of series 6 there is 3 mm nodule in the lateral aspect of
left upper lobe. There is no pleural effusion or pneumothorax.

Upper Abdomen: There is fatty infiltration in the liver. There are
surgical clips in the right renal fossa suggesting previous right
nephrectomy.

Musculoskeletal: Unremarkable.

Review of the MIP images confirms the above findings.
IMPRESSION: There is ectasia of ascending thoracic aorta measuring 3.7 cm. There
is no evidence of thoracic aortic dissection. There are scattered
coronary artery calcifications.

There are no intraluminal filling defects in the central pulmonary
artery branches. There is no focal pulmonary consolidation.

There is 3 mm nodule in the left upper lobe. No follow-up needed if
patient is low-risk.This recommendation follows the consensus
statement: Guidelines for Management of Incidental Pulmonary Nodules
Detected on CT Images: From the [HOSPITAL] 7776; Radiology

## 2024-04-11 ENCOUNTER — Other Ambulatory Visit: Payer: Self-pay | Admitting: Internal Medicine

## 2024-04-11 DIAGNOSIS — F900 Attention-deficit hyperactivity disorder, predominantly inattentive type: Secondary | ICD-10-CM

## 2024-04-13 ENCOUNTER — Other Ambulatory Visit: Payer: Self-pay | Admitting: Internal Medicine

## 2024-04-13 DIAGNOSIS — F900 Attention-deficit hyperactivity disorder, predominantly inattentive type: Secondary | ICD-10-CM

## 2024-04-16 ENCOUNTER — Encounter: Payer: Self-pay | Admitting: Internal Medicine

## 2024-04-16 DIAGNOSIS — F900 Attention-deficit hyperactivity disorder, predominantly inattentive type: Secondary | ICD-10-CM

## 2024-04-18 ENCOUNTER — Other Ambulatory Visit (HOSPITAL_BASED_OUTPATIENT_CLINIC_OR_DEPARTMENT_OTHER): Payer: Self-pay

## 2024-04-18 NOTE — Telephone Encounter (Signed)
 Spoke to the pharmacist and they have a refill for Adderall 10 mg but not the 20 mg. Okay to send?

## 2024-04-19 MED ORDER — AMPHETAMINE-DEXTROAMPHET ER 20 MG PO CP24: 20.0000 mg | ORAL_CAPSULE | Freq: Every day | ORAL | 0 refills | Status: DC

## 2024-04-20 ENCOUNTER — Other Ambulatory Visit (HOSPITAL_BASED_OUTPATIENT_CLINIC_OR_DEPARTMENT_OTHER): Payer: Self-pay

## 2024-04-20 ENCOUNTER — Other Ambulatory Visit: Payer: Self-pay | Admitting: Internal Medicine

## 2024-04-20 DIAGNOSIS — F900 Attention-deficit hyperactivity disorder, predominantly inattentive type: Secondary | ICD-10-CM

## 2024-04-21 ENCOUNTER — Other Ambulatory Visit (HOSPITAL_BASED_OUTPATIENT_CLINIC_OR_DEPARTMENT_OTHER): Payer: Self-pay

## 2024-04-21 ENCOUNTER — Other Ambulatory Visit: Payer: Self-pay | Admitting: Internal Medicine

## 2024-04-21 DIAGNOSIS — F900 Attention-deficit hyperactivity disorder, predominantly inattentive type: Secondary | ICD-10-CM

## 2024-04-21 NOTE — Telephone Encounter (Signed)
 Copied from CRM [id number]. Topic: Clinical - Medication Refill >> Apr 21, 2024  4:18 PM Rosaria Common wrote: Most Recent Primary Care Visit:  Provider: Raeanne Bull  Department: [facility]  Visit Type: OFFICE VISIT  Date: 01/06/2024  Medication: amphetamine -dextroamphetamine  (ADDERALL XR) 20 MG 24 hr capsule  Has the patient contacted their pharmacy? Yes (Agent: If no, request that the patient contact the pharmacy for the refill. If patient does not wish to contact the pharmacy document the reason why and proceed with request.) (Agent: If yes, when and what did the pharmacy advise?)  Is this the correct pharmacy for this prescription? Yes If no, delete pharmacy and type the correct one.  This is the patient's preferred pharmacy:    [facility] [address] [place] [address] Phone: [phone] Fax: [phone]   Has the prescription been filled recently? Yes  Is the patient out of the medication? Yes  Has the patient been seen for an appointment in the last year OR does the patient have an upcoming appointment? Yes  Can we respond through MyChart? Yes  Agent: Please be advised that Rx refills may take up to 3 business days. We ask that you follow-up with your pharmacy.

## 2024-04-25 ENCOUNTER — Other Ambulatory Visit (HOSPITAL_BASED_OUTPATIENT_CLINIC_OR_DEPARTMENT_OTHER): Payer: Self-pay

## 2024-04-25 MED ORDER — AMPHETAMINE-DEXTROAMPHET ER 20 MG PO CP24
20.0000 mg | ORAL_CAPSULE | Freq: Every day | ORAL | 0 refills | Status: DC
  Filled 2024-04-25: qty 30, 30d supply, fill #0

## 2024-04-25 NOTE — Addendum Note (Signed)
 Addended by: Nicolina Barrios B on: 04/25/2024 10:44 AM   Modules accepted: Orders

## 2024-05-10 ENCOUNTER — Ambulatory Visit
Admission: RE | Admit: 2024-05-10 | Discharge: 2024-05-10 | Disposition: A | Discharge status: Home / Self Care | Source: Ambulatory Visit | Attending: Internal Medicine | Admitting: Internal Medicine

## 2024-05-10 DIAGNOSIS — R918 Other nonspecific abnormal finding of lung field: Secondary | ICD-10-CM | POA: Diagnosis not present

## 2024-05-10 DIAGNOSIS — Z85528 Personal history of other malignant neoplasm of kidney: Secondary | ICD-10-CM | POA: Diagnosis not present

## 2024-05-10 DIAGNOSIS — R911 Solitary pulmonary nodule: Secondary | ICD-10-CM

## 2024-05-16 ENCOUNTER — Ambulatory Visit: Payer: Self-pay | Admitting: Internal Medicine

## 2024-05-26 ENCOUNTER — Encounter: Payer: Self-pay | Admitting: Internal Medicine

## 2024-05-26 ENCOUNTER — Other Ambulatory Visit (HOSPITAL_BASED_OUTPATIENT_CLINIC_OR_DEPARTMENT_OTHER): Payer: Self-pay

## 2024-05-26 ENCOUNTER — Other Ambulatory Visit: Payer: Self-pay | Admitting: Internal Medicine

## 2024-05-26 DIAGNOSIS — F900 Attention-deficit hyperactivity disorder, predominantly inattentive type: Secondary | ICD-10-CM

## 2024-05-26 DIAGNOSIS — Z905 Acquired absence of kidney: Secondary | ICD-10-CM | POA: Diagnosis not present

## 2024-05-26 DIAGNOSIS — R030 Elevated blood-pressure reading, without diagnosis of hypertension: Secondary | ICD-10-CM | POA: Diagnosis not present

## 2024-05-26 DIAGNOSIS — N1831 Chronic kidney disease, stage 3a: Secondary | ICD-10-CM | POA: Diagnosis not present

## 2024-05-26 DIAGNOSIS — E669 Obesity, unspecified: Secondary | ICD-10-CM | POA: Diagnosis not present

## 2024-05-26 DIAGNOSIS — M109 Gout, unspecified: Secondary | ICD-10-CM | POA: Diagnosis not present

## 2024-05-26 LAB — COMPREHENSIVE METABOLIC PANEL WITH GFR
Albumin: 4.6 (ref 3.5–5.0)
Calcium: 9.1 (ref 8.7–10.7)
Globulin: 2.5
eGFR: 59

## 2024-05-26 LAB — BASIC METABOLIC PANEL WITH GFR
BUN: 12 (ref 4–21)
CO2: 24 — AB (ref 13–22)
Chloride: 103 (ref 99–108)
Creatinine: 1.3 (ref 0.6–1.3)
Glucose: 98
Potassium: 4.6 meq/L (ref 3.5–5.1)
Sodium: 137 (ref 137–147)

## 2024-05-26 LAB — HEPATIC FUNCTION PANEL
ALT: 32 U/L (ref 10–40)
AST: 26 (ref 14–40)

## 2024-05-26 LAB — CBC AND DIFFERENTIAL: Hemoglobin: 15.8 (ref 13.5–17.5)

## 2024-05-26 MED ORDER — AMPHETAMINE-DEXTROAMPHET ER 20 MG PO CP24
20.0000 mg | ORAL_CAPSULE | Freq: Every day | ORAL | 0 refills | Status: DC
  Filled 2024-05-26: qty 30, 30d supply, fill #0

## 2024-05-26 MED ORDER — AMPHETAMINE-DEXTROAMPHETAMINE 10 MG PO TABS
10.0000 mg | ORAL_TABLET | Freq: Every day | ORAL | 0 refills | Status: DC
  Filled 2024-05-26: qty 30, 30d supply, fill #0

## 2024-05-26 NOTE — Telephone Encounter (Signed)
 Last office visit was 01/06/24.  Okay to fill?

## 2024-06-02 ENCOUNTER — Encounter: Payer: Self-pay | Admitting: Internal Medicine

## 2024-06-23 ENCOUNTER — Other Ambulatory Visit: Payer: Self-pay | Admitting: Internal Medicine

## 2024-06-24 ENCOUNTER — Other Ambulatory Visit (HOSPITAL_BASED_OUTPATIENT_CLINIC_OR_DEPARTMENT_OTHER): Payer: Self-pay

## 2024-06-24 MED ORDER — ROSUVASTATIN CALCIUM 20 MG PO TABS
20.0000 mg | ORAL_TABLET | Freq: Every day | ORAL | 0 refills | Status: DC
  Filled 2024-06-24: qty 30, 30d supply, fill #0

## 2024-06-27 ENCOUNTER — Other Ambulatory Visit: Payer: Self-pay | Admitting: Internal Medicine

## 2024-06-27 DIAGNOSIS — F900 Attention-deficit hyperactivity disorder, predominantly inattentive type: Secondary | ICD-10-CM

## 2024-06-28 ENCOUNTER — Encounter (HOSPITAL_BASED_OUTPATIENT_CLINIC_OR_DEPARTMENT_OTHER): Payer: Self-pay

## 2024-06-28 ENCOUNTER — Other Ambulatory Visit (HOSPITAL_BASED_OUTPATIENT_CLINIC_OR_DEPARTMENT_OTHER): Payer: Self-pay

## 2024-06-29 ENCOUNTER — Other Ambulatory Visit (HOSPITAL_BASED_OUTPATIENT_CLINIC_OR_DEPARTMENT_OTHER): Payer: Self-pay

## 2024-07-04 ENCOUNTER — Ambulatory Visit: Admitting: Internal Medicine

## 2024-07-04 DIAGNOSIS — F900 Attention-deficit hyperactivity disorder, predominantly inattentive type: Secondary | ICD-10-CM

## 2024-07-05 ENCOUNTER — Ambulatory Visit (INDEPENDENT_AMBULATORY_CARE_PROVIDER_SITE_OTHER): Admitting: Internal Medicine

## 2024-07-05 ENCOUNTER — Other Ambulatory Visit: Payer: Self-pay | Admitting: Internal Medicine

## 2024-07-05 ENCOUNTER — Other Ambulatory Visit: Payer: Self-pay

## 2024-07-05 ENCOUNTER — Other Ambulatory Visit (HOSPITAL_BASED_OUTPATIENT_CLINIC_OR_DEPARTMENT_OTHER): Payer: Self-pay

## 2024-07-05 ENCOUNTER — Encounter: Payer: Self-pay | Admitting: Internal Medicine

## 2024-07-05 VITALS — BP 118/80 | HR 98 | Temp 98.1°F | Resp 17 | Ht 67.0 in | Wt 258.4 lb

## 2024-07-05 DIAGNOSIS — E538 Deficiency of other specified B group vitamins: Secondary | ICD-10-CM | POA: Diagnosis not present

## 2024-07-05 DIAGNOSIS — Z85528 Personal history of other malignant neoplasm of kidney: Secondary | ICD-10-CM

## 2024-07-05 DIAGNOSIS — E782 Mixed hyperlipidemia: Secondary | ICD-10-CM

## 2024-07-05 DIAGNOSIS — F900 Attention-deficit hyperactivity disorder, predominantly inattentive type: Secondary | ICD-10-CM | POA: Diagnosis not present

## 2024-07-05 DIAGNOSIS — E559 Vitamin D deficiency, unspecified: Secondary | ICD-10-CM

## 2024-07-05 LAB — CBC WITH DIFFERENTIAL/PLATELET
Basophils Absolute: 0.1 K/uL (ref 0.0–0.1)
Basophils Relative: 0.8 % (ref 0.0–3.0)
Eosinophils Absolute: 0.3 K/uL (ref 0.0–0.7)
Eosinophils Relative: 3.7 % (ref 0.0–5.0)
HCT: 44.2 % (ref 39.0–52.0)
Hemoglobin: 15.4 g/dL (ref 13.0–17.0)
Lymphocytes Relative: 23.8 % (ref 12.0–46.0)
Lymphs Abs: 2 K/uL (ref 0.7–4.0)
MCHC: 34.9 g/dL (ref 30.0–36.0)
MCV: 87.7 fl (ref 78.0–100.0)
Monocytes Absolute: 0.6 K/uL (ref 0.1–1.0)
Monocytes Relative: 6.8 % (ref 3.0–12.0)
Neutro Abs: 5.4 K/uL (ref 1.4–7.7)
Neutrophils Relative %: 64.9 % (ref 43.0–77.0)
Platelets: 198 K/uL (ref 150.0–400.0)
RBC: 5.04 Mil/uL (ref 4.22–5.81)
RDW: 14 % (ref 11.5–15.5)
WBC: 8.3 K/uL (ref 4.0–10.5)

## 2024-07-05 LAB — COMPREHENSIVE METABOLIC PANEL WITH GFR
ALT: 29 U/L (ref 0–53)
AST: 27 U/L (ref 0–37)
Albumin: 4.3 g/dL (ref 3.5–5.2)
Alkaline Phosphatase: 78 U/L (ref 39–117)
BUN: 17 mg/dL (ref 6–23)
CO2: 23 meq/L (ref 19–32)
Calcium: 9 mg/dL (ref 8.4–10.5)
Chloride: 105 meq/L (ref 96–112)
Creatinine, Ser: 1.24 mg/dL (ref 0.40–1.50)
GFR: 60.19 mL/min (ref >60.00)
Glucose, Bld: 147 mg/dL — ABNORMAL HIGH (ref 70–99)
Potassium: 4.1 meq/L (ref 3.5–5.1)
Sodium: 138 meq/L (ref 135–145)
Total Bilirubin: 0.5 mg/dL (ref 0.2–1.2)
Total Protein: 6.9 g/dL (ref 6.0–8.3)

## 2024-07-05 LAB — VITAMIN D 25 HYDROXY (VIT D DEFICIENCY, FRACTURES): VITD: 35.78 ng/mL (ref 30.00–100.00)

## 2024-07-05 LAB — LIPID PANEL
Cholesterol: 156 mg/dL (ref 0–200)
HDL: 42.9 mg/dL (ref >39.00)
NonHDL: 113.15
Total CHOL/HDL Ratio: 4
Triglycerides: 416 mg/dL — ABNORMAL HIGH (ref 0.0–149.0)
VLDL: 83.2 mg/dL — ABNORMAL HIGH (ref 0.0–40.0)

## 2024-07-05 LAB — VITAMIN B12: Vitamin B-12: 404 pg/mL (ref 211–911)

## 2024-07-05 LAB — LDL CHOLESTEROL, DIRECT: Direct LDL: 88 mg/dL

## 2024-07-05 MED ORDER — AMPHETAMINE-DEXTROAMPHETAMINE 10 MG PO TABS: 10.0000 mg | ORAL_TABLET | Freq: Every day | ORAL | 0 refills | Status: DC

## 2024-07-05 MED ORDER — AMPHETAMINE-DEXTROAMPHETAMINE 10 MG PO TABS
10.0000 mg | ORAL_TABLET | Freq: Every day | ORAL | 0 refills | Status: DC
  Filled 2024-08-06: qty 30, 30d supply, fill #0

## 2024-07-05 MED ORDER — AMPHETAMINE-DEXTROAMPHET ER 20 MG PO CP24
20.0000 mg | ORAL_CAPSULE | Freq: Every day | ORAL | 0 refills | Status: DC
  Filled 2024-07-05: qty 30, 30d supply, fill #0

## 2024-07-05 MED ORDER — AMPHETAMINE-DEXTROAMPHET ER 20 MG PO CP24
20.0000 mg | ORAL_CAPSULE | Freq: Every day | ORAL | 0 refills | Status: DC
  Filled 2024-07-05 – 2024-08-06 (×2): qty 30, 30d supply, fill #0

## 2024-07-05 NOTE — Progress Notes (Signed)
 Established Patient Office Visit     CC/Reason for Visit: Medication refills, follow-up chronic conditions  HPI: Alan Tyler is a 68 y.o. male who is coming in today for the above mentioned reasons. Past Medical History is significant for: ADHD on Adderall who is due for refills, obesity, hyperlipidemia, vitamin D  deficiency and depression.  No major concerns or complaints.   Past Medical/Surgical History: Past Medical History:  Diagnosis Date   Allergy    SEASONAL   Anxiety    Arthritis    Cancer (HCC) 2003   kidney - 2003   Cataract    BILATERAL-REMOVED   Chronic kidney disease    kidney stones and (R) nephrectomy   Colon polyps    Depression    GERD (gastroesophageal reflux disease)    Hyperlipidemia    PVC's (premature ventricular contractions)    arrhythmia    Past Surgical History:  Procedure Laterality Date   CATARACT EXTRACTION     right eye   COLONOSCOPY  2014   EYE SURGERY     JOINT REPLACEMENT     NEPHRECTOMY  2003   (R) for cancer tumor   POLYPECTOMY     RETINAL DETACHMENT SURGERY  2012   TOTAL KNEE REPLACEMENTS Bilateral     Social History:  reports that he quit smoking about 22 years ago. His smoking use included cigarettes, pipe, and cigars. He quit smokeless tobacco use about 21 years ago. He reports current alcohol use of about 4.0 standard drinks of alcohol per week. He reports that he does not use drugs.  Allergies: Allergies  Allergen Reactions   Atorvastatin  Other (See Comments)    Myalgia   Sulfa Drugs Cross Reactors     Per pt: unknown    Family History:  Family History  Problem Relation Age of Onset   Arthritis Mother    Depression Mother    Colon cancer Mother 104       part of colon was removed   Alcohol abuse Father    Heart disease Father    Hyperlipidemia Father    Hyperlipidemia Maternal Grandmother    Hyperlipidemia Maternal Grandfather    Hyperlipidemia Paternal Grandmother    Diabetes Paternal Grandmother     Rectal cancer Paternal Grandmother    Arthritis Paternal Grandfather    Hyperlipidemia Paternal Grandfather    Stomach cancer Neg Hx    Esophageal cancer Neg Hx    Crohn's disease Neg Hx      Current Outpatient Medications:    buPROPion  (WELLBUTRIN  XL) 300 MG 24 hr tablet, Take 1 tablet (300 mg total) by mouth daily., Disp: 90 tablet, Rfl: 1   Multiple Vitamin (ONE-A-DAY MENS PO), Take 1 tablet by mouth daily., Disp: , Rfl:    rosuvastatin  (CRESTOR ) 20 MG tablet, Take 1 tablet (20 mg total) by mouth daily., Disp: 30 tablet, Rfl: 0   amphetamine -dextroamphetamine  (ADDERALL XR) 20 MG 24 hr capsule, Take 1 capsule (20 mg total) by mouth daily., Disp: 30 capsule, Rfl: 0   amphetamine -dextroamphetamine  (ADDERALL XR) 20 MG 24 hr capsule, Take 1 capsule (20 mg total) by mouth daily., Disp: 30 capsule, Rfl: 0   amphetamine -dextroamphetamine  (ADDERALL XR) 20 MG 24 hr capsule, Take 1 capsule (20 mg total) by mouth daily., Disp: 30 capsule, Rfl: 0   amphetamine -dextroamphetamine  (ADDERALL) 10 MG tablet, Take 1 tablet (10 mg total) by mouth daily as needed., Disp: 30 tablet, Rfl: 0   amphetamine -dextroamphetamine  (ADDERALL) 10 MG tablet, Take 1 tablet (10  mg total) by mouth daily., Disp: 30 tablet, Rfl: 0   [START ON 07/25/2024] amphetamine -dextroamphetamine  (ADDERALL) 10 MG tablet, Take 1 tablet (10 mg total) by mouth daily., Disp: 30 tablet, Rfl: 0   [START ON 07/25/2024] amphetamine -dextroamphetamine  (ADDERALL) 10 MG tablet, Take 1 tablet (10 mg total) by mouth daily., Disp: 30 tablet, Rfl: 0   [START ON 07/25/2024] amphetamine -dextroamphetamine  (ADDERALL) 10 MG tablet, Take 1 tablet (10 mg total) by mouth daily., Disp: 30 tablet, Rfl: 0   OVER THE COUNTER MEDICATION, NATURAL PSYLIUM HUSK FIBER 0.52 grams,(5Capsules) daily, Disp: , Rfl:   Review of Systems:  Negative unless indicated in HPI.   Physical Exam: Vitals:   07/05/24 1423  BP: 118/80  Pulse: 98  Resp: 17  Temp: 98.1 F (36.7 C)   TempSrc: Oral  SpO2: 98%  Weight: 258 lb 6.4 oz (117.2 kg)  Height: 5' 7 (1.702 m)    Body mass index is 40.47 kg/m.   Physical Exam   Impression and Plan:  Attention deficit hyperactivity disorder (ADHD), predominantly inattentive type -     Amphetamine -Dextroamphet ER; Take 1 capsule (20 mg total) by mouth daily.  Dispense: 30 capsule; Refill: 0 -     Amphetamine -Dextroamphet ER; Take 1 capsule (20 mg total) by mouth daily.  Dispense: 30 capsule; Refill: 0 -     Amphetamine -Dextroamphet ER; Take 1 capsule (20 mg total) by mouth daily.  Dispense: 30 capsule; Refill: 0 -     Amphetamine -Dextroamphetamine ; Take 1 tablet (10 mg total) by mouth daily.  Dispense: 30 tablet; Refill: 0 -     Amphetamine -Dextroamphetamine ; Take 1 tablet (10 mg total) by mouth daily.  Dispense: 30 tablet; Refill: 0 -     Amphetamine -Dextroamphetamine ; Take 1 tablet (10 mg total) by mouth daily.  Dispense: 30 tablet; Refill: 0  Vitamin D  deficiency -     VITAMIN D  25 Hydroxy (Vit-D Deficiency, Fractures); Future  Vitamin B12 deficiency -     Vitamin B12; Future  Mixed hyperlipidemia -     Comprehensive metabolic panel with GFR; Future -     Lipid panel; Future  History of kidney cancer -     CBC with Differential/Platelet; Future   - PDMP reviewed, no red flags, overdose risk score is 100. - Refill Adderall 20 mg XR daily and an additional 10 mg in the afternoon as needed. - Check vitamin D  B12 levels and cholesterol.  Time spent:30 minutes reviewing chart, interviewing and examining patient and formulating plan of care.     Tully Theophilus Andrews, MD [facility]

## 2024-07-06 ENCOUNTER — Ambulatory Visit: Payer: Self-pay | Admitting: Internal Medicine

## 2024-07-06 ENCOUNTER — Other Ambulatory Visit (HOSPITAL_BASED_OUTPATIENT_CLINIC_OR_DEPARTMENT_OTHER): Payer: Self-pay

## 2024-07-06 MED ORDER — AMPHETAMINE-DEXTROAMPHETAMINE 10 MG PO TABS
10.0000 mg | ORAL_TABLET | Freq: Every day | ORAL | 0 refills | Status: DC
  Filled 2024-07-06: qty 30, 30d supply, fill #0

## 2024-07-28 DIAGNOSIS — H524 Presbyopia: Secondary | ICD-10-CM | POA: Diagnosis not present

## 2024-07-28 DIAGNOSIS — Z0100 Encounter for examination of eyes and vision without abnormal findings: Secondary | ICD-10-CM | POA: Diagnosis not present

## 2024-08-04 ENCOUNTER — Other Ambulatory Visit (HOSPITAL_BASED_OUTPATIENT_CLINIC_OR_DEPARTMENT_OTHER): Payer: Self-pay

## 2024-08-04 ENCOUNTER — Other Ambulatory Visit: Payer: Self-pay

## 2024-08-04 ENCOUNTER — Other Ambulatory Visit: Payer: Self-pay | Admitting: Internal Medicine

## 2024-08-04 ENCOUNTER — Other Ambulatory Visit (HOSPITAL_COMMUNITY): Payer: Self-pay

## 2024-08-04 MED ORDER — ROSUVASTATIN CALCIUM 20 MG PO TABS
20.0000 mg | ORAL_TABLET | Freq: Every day | ORAL | 1 refills | Status: DC
  Filled 2024-08-04 – 2024-08-06 (×2): qty 30, 30d supply, fill #0
  Filled 2024-09-12: qty 30, 30d supply, fill #1

## 2024-08-05 ENCOUNTER — Other Ambulatory Visit: Payer: Self-pay | Admitting: Internal Medicine

## 2024-08-05 ENCOUNTER — Other Ambulatory Visit (HOSPITAL_BASED_OUTPATIENT_CLINIC_OR_DEPARTMENT_OTHER): Payer: Self-pay

## 2024-08-05 DIAGNOSIS — D225 Melanocytic nevi of trunk: Secondary | ICD-10-CM | POA: Diagnosis not present

## 2024-08-05 DIAGNOSIS — F900 Attention-deficit hyperactivity disorder, predominantly inattentive type: Secondary | ICD-10-CM

## 2024-08-05 DIAGNOSIS — Z1283 Encounter for screening for malignant neoplasm of skin: Secondary | ICD-10-CM | POA: Diagnosis not present

## 2024-08-06 ENCOUNTER — Other Ambulatory Visit (HOSPITAL_BASED_OUTPATIENT_CLINIC_OR_DEPARTMENT_OTHER): Payer: Self-pay

## 2024-08-08 ENCOUNTER — Other Ambulatory Visit (HOSPITAL_COMMUNITY): Payer: Self-pay

## 2024-09-05 ENCOUNTER — Other Ambulatory Visit: Payer: Self-pay | Admitting: Internal Medicine

## 2024-09-05 ENCOUNTER — Other Ambulatory Visit (HOSPITAL_BASED_OUTPATIENT_CLINIC_OR_DEPARTMENT_OTHER): Payer: Self-pay

## 2024-09-05 DIAGNOSIS — F900 Attention-deficit hyperactivity disorder, predominantly inattentive type: Secondary | ICD-10-CM

## 2024-09-06 ENCOUNTER — Other Ambulatory Visit (HOSPITAL_BASED_OUTPATIENT_CLINIC_OR_DEPARTMENT_OTHER): Payer: Self-pay

## 2024-09-06 MED ORDER — AMPHETAMINE-DEXTROAMPHET ER 20 MG PO CP24
20.0000 mg | ORAL_CAPSULE | Freq: Every day | ORAL | 0 refills | Status: DC
  Filled 2024-09-06: qty 30, 30d supply, fill #0

## 2024-09-06 MED ORDER — AMPHETAMINE-DEXTROAMPHETAMINE 10 MG PO TABS
10.0000 mg | ORAL_TABLET | Freq: Every day | ORAL | 0 refills | Status: DC
  Filled 2024-09-06: qty 30, 30d supply, fill #0

## 2024-09-19 ENCOUNTER — Ambulatory Visit (INDEPENDENT_AMBULATORY_CARE_PROVIDER_SITE_OTHER)

## 2024-09-19 VITALS — Ht 67.0 in | Wt 245.0 lb

## 2024-09-19 DIAGNOSIS — Z0000 Encounter for general adult medical examination without abnormal findings: Secondary | ICD-10-CM | POA: Diagnosis not present

## 2024-09-19 NOTE — Progress Notes (Signed)
 Subjective:   Alan Tyler is a 68 y.o. who presents for a Medicare Wellness preventive visit.  As a reminder, Annual Wellness Visits don't include a physical exam, and some assessments may be limited, especially if this visit is performed virtually. We may recommend an in-person follow-up visit with your provider if needed.  Visit Complete: Virtual I connected with  Alan Tyler on 09/19/24 by a audio enabled telemedicine application and verified that I am speaking with the correct person using two identifiers.  Patient Location: Home  Provider Location: Home Office  I discussed the limitations of evaluation and management by telemedicine. The patient expressed understanding and agreed to proceed.  Vital Signs: Because this visit was a virtual/telehealth visit, some criteria may be missing or patient reported. Any vitals not documented were not able to be obtained and vitals that have been documented are patient reported.  VideoDeclined- This patient declined Librarian, academic. Therefore the visit was completed with audio only.  Persons Participating in Visit: Patient.  AWV Questionnaire: Yes: Patient Medicare AWV questionnaire was completed by the patient on 09/18/2024; I have confirmed that all information answered by patient is correct and no changes since this date.  Cardiac Risk Factors include: advanced age (>85men, >42 women);male gender;dyslipidemia;obesity (BMI >30kg/m2);Other (see comment), Risk factor comments: PVC     Objective:    Today's Vitals   09/19/24 1309  Weight: 245 lb (111.1 kg)  Height: 5' 7 (1.702 m)   Body mass index is 38.37 kg/m.     09/19/2024    1:15 PM 07/29/2023    8:05 AM 05/03/2021    1:38 AM 05/25/2018    7:26 AM 01/16/2015    2:46 PM  Advanced Directives  Does Patient Have a Medical Advance Directive? Yes Yes No;Yes Yes  No   Type of Estate agent of [place];Living will Living  will;Healthcare Power of Blue;Out of facility DNR (pink MOST or yellow form) Healthcare Power of [employer] of [place];Living will   Does patient want to make changes to medical advance directive?  No - Patient declined  No - Patient declined    Copy of Healthcare Power of Attorney in Chart? No - copy requested No - copy requested  No - copy requested    Would patient like information on creating a medical advance directive?     No - patient declined information      Data saved with a previous flowsheet row definition    Current Medications (verified) Outpatient Encounter Medications as of 09/19/2024  Medication Sig   amphetamine -dextroamphetamine  (ADDERALL XR) 20 MG 24 hr capsule Take 1 capsule (20 mg total) by mouth daily.   amphetamine -dextroamphetamine  (ADDERALL XR) 20 MG 24 hr capsule Take 1 capsule (20 mg total) by mouth daily.   amphetamine -dextroamphetamine  (ADDERALL XR) 20 MG 24 hr capsule Take 1 capsule (20 mg total) by mouth daily.   amphetamine -dextroamphetamine  (ADDERALL) 10 MG tablet Take 1 tablet (10 mg total) by mouth daily.   amphetamine -dextroamphetamine  (ADDERALL) 10 MG tablet Take 1 tablet (10 mg total) by mouth daily.   amphetamine -dextroamphetamine  (ADDERALL) 10 MG tablet Take 1 tablet (10 mg total) by mouth daily.   buPROPion  (WELLBUTRIN  XL) 300 MG 24 hr tablet Take 1 tablet (300 mg total) by mouth daily.   Multiple Vitamin (ONE-A-DAY MENS PO) Take 1 tablet by mouth daily.   OVER THE COUNTER MEDICATION NATURAL PSYLIUM HUSK FIBER 0.52 grams,(5Capsules) daily   rosuvastatin  (CRESTOR ) 20 MG tablet Take 1  tablet (20 mg total) by mouth daily. Please call office to schedule an appointment for refills. [phone].   amphetamine -dextroamphetamine  (ADDERALL) 10 MG tablet Take 1 tablet (10 mg total) by mouth daily as needed.   amphetamine -dextroamphetamine  (ADDERALL) 10 MG tablet Take 1 tablet (10 mg total) by mouth daily.   No facility-administered encounter  medications on file as of 09/19/2024.    Allergies (verified) Atorvastatin  and Sulfa drugs cross reactors   History: Past Medical History:  Diagnosis Date   Allergy    SEASONAL   Anxiety    Arthritis    Cancer (HCC) 2003   kidney - 2003   Cataract    BILATERAL-REMOVED   Chronic kidney disease    kidney stones and (R) nephrectomy   Colon polyps    Depression    GERD (gastroesophageal reflux disease)    Hyperlipidemia    PVC's (premature ventricular contractions)    arrhythmia   Past Surgical History:  Procedure Laterality Date   CATARACT EXTRACTION     right eye   COLONOSCOPY  2014   EYE SURGERY     JOINT REPLACEMENT     NEPHRECTOMY  2003   (R) for cancer tumor   POLYPECTOMY     RETINAL DETACHMENT SURGERY  2012   TOTAL KNEE REPLACEMENTS Bilateral    Family History  Problem Relation Age of Onset   Arthritis Mother    Depression Mother    Colon cancer Mother 77       part of colon was removed   Alcohol abuse Father    Heart disease Father    Hyperlipidemia Father    Hyperlipidemia Maternal Grandmother    Hyperlipidemia Maternal Grandfather    Hyperlipidemia Paternal Grandmother    Diabetes Paternal Grandmother    Rectal cancer Paternal Grandmother    Arthritis Paternal Grandfather    Hyperlipidemia Paternal Grandfather    Stomach cancer Neg Hx    Esophageal cancer Neg Hx    Crohn's disease Neg Hx    Social History   Socioeconomic History   Marital status: Married    Spouse name: Devere   Number of children: 3   Years of education: Not on file   Highest education level: Professional school degree (e.g., MD, DDS, DVM, JD)  Occupational History   Occupation: manufacturing-unemployed    Employer: NOT EMPLOYED   Occupation: RETIRED  Tobacco Use   Smoking status: Former    Current packs/day: 0.00    Types: Cigarettes, Pipe, Cigars    Quit date: 12/29/2001    Years since quitting: 22.7   Smokeless tobacco: Former    Quit date: 12/27/2002  Vaping Use    Vaping status: Never Used  Substance and Sexual Activity   Alcohol use: Yes    Alcohol/week: 4.0 standard drinks of alcohol    Types: 2 Cans of beer, 2 Standard drinks or equivalent per week   Drug use: No   Sexual activity: Yes    Birth control/protection: None  Other Topics Concern   Not on file  Social History Narrative   Lives with spouse/ 1 daughter and 1 granddaughter/2025  Has one cat   Social Drivers of Corporate investment banker Strain: Low Risk  (09/19/2024)   Overall Financial Resource Strain (CARDIA)    Difficulty of Paying Living Expenses: Not hard at all  Food Insecurity: No Food Insecurity (09/19/2024)   Hunger Vital Sign    Worried About Running Out of Food in the Last Year: Never true  Ran Out of Food in the Last Year: Never true  Transportation Needs: No Transportation Needs (09/19/2024)   PRAPARE - Administrator, Civil Service (Medical): No    Lack of Transportation (Non-Medical): No  Physical Activity: Sufficiently Active (09/19/2024)   Exercise Vital Sign    Days of Exercise per Week: 5 days    Minutes of Exercise per Session: 50 min  Stress: No Stress Concern Present (09/19/2024)   [employer] of Occupational Health - Occupational Stress Questionnaire    Feeling of Stress: Not at all  Social Connections: Moderately Isolated (09/19/2024)   Social Connection and Isolation Panel    Frequency of Communication with Friends and Family: More than three times a week    Frequency of Social Gatherings with Friends and Family: More than three times a week    Attends Religious Services: Never    Database administrator or Organizations: No    Attends Engineer, structural: Never    Marital Status: Married    Tobacco Counseling Counseling given: Not Answered    Clinical Intake:  Pre-visit preparation completed: Yes  Pain : No/denies pain     BMI - recorded: 38.37 Nutritional Status: BMI > 30  Obese Nutritional Risks:  None Diabetes: No  Lab Results  Component Value Date   HGBA1C 5.4 07/23/2022   HGBA1C 5.7 03/26/2021   HGBA1C 5.7 03/23/2020     How often do you need to have someone help you when you read instructions, pamphlets, or other written materials from your doctor or pharmacy?: 1 - Never  Interpreter Needed?: No  Information entered by :: Neziah Vogelgesang, RMA   Activities of Daily Living     09/18/2024    8:22 PM  In your present state of health, do you have any difficulty performing the following activities:  Hearing? 0  Vision? 0  Difficulty concentrating or making decisions? 0  Walking or climbing stairs? 0  Dressing or bathing? 0  Doing errands, shopping? 0  Preparing Food and eating ? N  Using the Toilet? N  In the past six months, have you accidently leaked urine? N  Do you have problems with loss of bowel control? N  Managing your Medications? N  Managing your Finances? N  Housekeeping or managing your Housekeeping? N    Patient Care Team: Theophilus Andrews, Tully GRADE, MD as PCP - General (Internal Medicine) Mona Vinie BROCKS, MD as PCP - Cardiology (Cardiology)  I have updated your Care Teams any recent Medical Services you may have received from other providers in the past year.     Assessment:   This is a routine wellness examination for Deylan.  Hearing/Vision screen Hearing Screening - Comments:: Denies hearing difficulties   Vision Screening - Comments:: Wears eyeglasses/Dr. Richardson/Dr. Rankin   Goals Addressed               This Visit's Progress     Patient Stated (pt-stated)        Not at this time/2025       Depression Screen     09/19/2024    1:16 PM 01/06/2024   11:22 AM 07/29/2023    8:18 AM 11/07/2021    3:51 PM 03/26/2021    1:24 PM 09/20/2020   10:00 AM 03/23/2020    9:59 AM  PHQ 2/9 Scores  PHQ - 2 Score 0 0 0 0 0 0 0  PHQ- 9 Score 3 2 0 2 0 0 0  Fall Risk     09/18/2024    8:22 PM 01/06/2024   11:22 AM 07/29/2023    8:18 AM  12/02/2022    3:50 PM 11/07/2021    3:51 PM  Fall Risk   Falls in the past year? 0 0 0 0 0  Number falls in past yr:  0 0 0 0  Injury with Fall?  0 0 0 0  Risk for fall due to :    No Fall Risks   Follow up Falls evaluation completed;Falls prevention discussed Falls evaluation completed Falls evaluation completed Falls evaluation completed       Data saved with a previous flowsheet row definition    MEDICARE RISK AT HOME:  Medicare Risk at Home Any stairs in or around the home?: (Patient-Rptd) No Home free of loose throw rugs in walkways, pet beds, electrical cords, etc?: (Patient-Rptd) No Adequate lighting in your home to reduce risk of falls?: (Patient-Rptd) Yes Life alert?: (Patient-Rptd) No Use of a cane, walker or w/c?: (Patient-Rptd) No Grab bars in the bathroom?: (Patient-Rptd) No Shower chair or bench in shower?: (Patient-Rptd) No Elevated toilet seat or a handicapped toilet?: (Patient-Rptd) No  TIMED UP AND GO:  Was the test performed?  No  Cognitive Function: Declined/Normal: No cognitive concerns noted by patient or family. Patient alert, oriented, able to answer questions appropriately and recall recent events. No signs of memory loss or confusion.        07/29/2023    8:06 AM  6CIT Screen  What Year? 0 points  What month? 0 points  What time? 0 points  Count back from 20 0 points  Months in reverse 0 points  Repeat phrase 0 points  Total Score 0 points    Immunizations Immunization History  Administered Date(s) Administered   Fluad  Trivalent(High Dose 65+) 02/08/2024   Influenza Split 12/08/2011, 12/07/2012   Influenza,inj,Quad PF,6+ Mos 09/08/2013, 09/27/2015, 09/20/2020   Influenza-Unspecified 09/28/2018, 10/30/2019   Moderna Sars-Covid-2 Vaccination 02/27/2020, 03/29/2020, 10/29/2020   PNEUMOCOCCAL CONJUGATE-20 07/23/2022, 07/29/2023   Pfizer(Comirnaty )Fall Seasonal Vaccine 12 years and older 02/08/2024   Td 08/01/2013   Td (Adult), 2 Lf Tetanus  Toxid, Preservative Free 08/01/2013   Zoster Recombinant(Shingrix ) 03/26/2021, 07/23/2022, 07/29/2023    Screening Tests Health Maintenance  Topic Date Due   Medicare Annual Wellness (AWV)  Never done   DTaP/Tdap/Td (3 - Tdap) 08/02/2023   COVID-19 Vaccine (5 - 2024-25 season) 08/29/2024   Influenza Vaccine  03/28/2025 (Originally 07/29/2024)   Colonoscopy  06/02/2029   Pneumococcal Vaccine: 50+ Years  Completed   Hepatitis C Screening  Completed   Zoster Vaccines- Shingrix   Completed   HPV VACCINES  Aged Out   Meningococcal B Vaccine  Aged Out    Health Maintenance Items Addressed: See Nurse Notes at the end of this note  Additional Screening:  Vision Screening: Recommended annual ophthalmology exams for early detection of glaucoma and other disorders of the eye. Is the patient up to date with their annual eye exam?  No  Who is the provider or what is the name of the office in which the patient attends annual eye exams? Dr. Elner and Dr. Estelle  Dental Screening: Recommended annual dental exams for proper oral hygiene  Community Resource Referral / Chronic Care Management: CRR required this visit?  No   CCM required this visit?  No   Plan:    I have personally reviewed and noted the following in the patient's chart:   Medical and social history  Use of alcohol, tobacco or illicit drugs  Current medications and supplements including opioid prescriptions. Patient is not currently taking opioid prescriptions. Functional ability and status Nutritional status Physical activity Advanced directives List of other physicians Hospitalizations, surgeries, and ER visits in previous 12 months Vitals Screenings to include cognitive, depression, and falls Referrals and appointments  In addition, I have reviewed and discussed with patient certain preventive protocols, quality metrics, and best practice recommendations. A written personalized care plan for preventive services  as well as general preventive health recommendations were provided to patient.   Chiara Coltrin L Rosa Wyly, CMA   09/19/2024   After Visit Summary: (MyChart) Due to this being a telephonic visit, the after visit summary with patients personalized plan was offered to patient via MyChart   Notes: Patient is due for a Tdap.  Patient is up to date on all other health maintenance with no concerns to address today.

## 2024-09-19 NOTE — Patient Instructions (Signed)
 Alan Tyler,  Thank you for taking the time for your Medicare Wellness Visit. I appreciate your continued commitment to your health goals. Please review the care plan we discussed, and feel free to reach out if I can assist you further.  Medicare recommends these wellness visits once per year to help you and your care team stay ahead of potential health issues. These visits are designed to focus on prevention, allowing your provider to concentrate on managing your acute and chronic conditions during your regular appointments.  Please note that Annual Wellness Visits do not include a physical exam. Some assessments may be limited, especially if the visit was conducted virtually. If needed, we may recommend a separate in-person follow-up with your provider.  Ongoing Care Seeing your primary care provider every 3 to 6 months helps us  monitor your health and provide consistent, personalized care. Last office visit on 07/05/2024.  You are due for a tetanus and can get that done at your local pharmacy.  Keep up the good work.  Referrals If a referral was made during today's visit and you haven't received any updates within two weeks, please contact the referred provider directly to check on the status.  Recommended Screenings:  Health Maintenance  Topic Date Due   Medicare Annual Wellness Visit  Never done   DTaP/Tdap/Td vaccine (3 - Tdap) 08/02/2023   COVID-19 Vaccine (5 - 2024-25 season) 08/29/2024   Flu Shot  03/28/2025*   Colon Cancer Screening  06/02/2029   Pneumococcal Vaccine for age over 48  Completed   Hepatitis C Screening  Completed   Zoster (Shingles) Vaccine  Completed   HPV Vaccine  Aged Out   Meningitis B Vaccine  Aged Out  *Topic was postponed. The date shown is not the original due date.       09/19/2024    1:15 PM  Advanced Directives  Does Patient Have a Medical Advance Directive? Yes  Type of Estate agent of [place];Living will  Copy of  Healthcare Power of Attorney in Chart? No - copy requested   Advance Care Planning is important because it: Ensures you receive medical care that aligns with your values, goals, and preferences. Provides guidance to your family and loved ones, reducing the emotional burden of decision-making during critical moments.  Vision: Annual vision screenings are recommended for early detection of glaucoma, cataracts, and diabetic retinopathy. These exams can also reveal signs of chronic conditions such as diabetes and high blood pressure.  Dental: Annual dental screenings help detect early signs of oral cancer, gum disease, and other conditions linked to overall health, including heart disease and diabetes.  Please see the attached documents for additional preventive care recommendations.

## 2024-09-26 DIAGNOSIS — H35352 Cystoid macular degeneration, left eye: Secondary | ICD-10-CM | POA: Diagnosis not present

## 2024-09-26 DIAGNOSIS — H35372 Puckering of macula, left eye: Secondary | ICD-10-CM | POA: Diagnosis not present

## 2024-09-26 DIAGNOSIS — H2702 Aphakia, left eye: Secondary | ICD-10-CM | POA: Diagnosis not present

## 2024-09-26 DIAGNOSIS — Z9889 Other specified postprocedural states: Secondary | ICD-10-CM | POA: Diagnosis not present

## 2024-09-28 ENCOUNTER — Encounter: Payer: Self-pay | Admitting: Internal Medicine

## 2024-09-28 ENCOUNTER — Ambulatory Visit: Attending: Internal Medicine | Admitting: Internal Medicine

## 2024-09-28 VITALS — BP 128/88 | HR 81 | Ht 67.0 in | Wt 246.8 lb

## 2024-09-28 DIAGNOSIS — E785 Hyperlipidemia, unspecified: Secondary | ICD-10-CM | POA: Diagnosis not present

## 2024-09-28 DIAGNOSIS — I77810 Thoracic aortic ectasia: Secondary | ICD-10-CM | POA: Diagnosis not present

## 2024-09-28 DIAGNOSIS — I493 Ventricular premature depolarization: Secondary | ICD-10-CM | POA: Diagnosis not present

## 2024-09-28 DIAGNOSIS — I2510 Atherosclerotic heart disease of native coronary artery without angina pectoris: Secondary | ICD-10-CM

## 2024-09-28 NOTE — Progress Notes (Signed)
 OFFICE CONSULT NOTE  Chief Complaint:  No complaints  Primary Care Physician: Theophilus Andrews, Tully GRADE, MD  HPI:  Alan Tyler is a 68 y.o. male who is being seen today for the evaluation of chest pain at the request of Theophilus Andrews, Jonna*. This is a very pleasant 68 year old male who works at the [employer].  He has a history of chest pain in the past been evaluated once in 2009 with stress testing in [place] and he previously saw Dr. Peter [place] in 2014.  At the time he was having some PVCs which were noted when he donated blood.  He underwent an exercise stress echocardiogram which was negative for ischemia.  He had also had some atypical sounding chest pain.  He does have heart disease in his family coding his father who had premature onset coronary disease.  Recently he was working and noticed he had some left-sided chest pain as well as numbness and tingling in his left arm which was a new finding.  I lasted for 15 to 20 minutes.  Subsequently he was seen in the emergency department.  EKG showed no ischemic changes.  He had negative troponins.  He was asked to follow-up with cardiology for further testing.  He denies any recurrent anginal symptoms since then.  He does have some degree of dyslipidemia with total cholesterol in March at 199, HDL 39, LDL 137 and triglycerides 887.  This was addressed with his primary care provider and the plan was to work on diet and lifestyle modification and repeat his lipids this summer.  No history of hypertension or diabetes and is not currently on any medications other than some bupropion  and Adderall.  04/03/2022  Alan Tyler returns today for follow-up.  He had undergone CT calcium  scoring which showed a calcium  score of 123, 61st percentile for age and sex matched controls.  This also showed a dilated aorta at 40 mm.  This was in May 2022.  I advised starting atorvastatin  but he said he took it for about a month or so and had  significant myalgias.  Subsequently he stopped the medicine.  He then had issues with both of his knees and require knee replacements.  Unfortunately he also said that his son died unexpectedly last month.  He remains asymptomatic denying chest pain or shortness of breath.  10/17/2022  Alan Tyler is seen today in follow-up.  He is doing very well.  He denies any worsening shortness of breath or chest pain.  His lipids are much better now showing total cholesterol 119, triglycerides 78, HDL 37 and LDL 63.  He seems to be tolerating rosuvastatin  much better than atorvastatin .  Blood pressure is controlled.  His EKG today shows a sinus rhythm with PVCs.  He reports no symptoms with the PVCs but says he has had those before.  He thinks they may be related to Adderall and caffeine.  He says he drinks about 4 cups of coffee a day.  09/28/2024  Alan Tyler is seen today in follow-up.  He says he is doing pretty well.  He recently retired.  He is working out for 5 times a week with a Psychologist, educational through the [employer] program at [employer].  He did have lipid testing which was in July.  Direct LDL had improved substantially to 88, down from 163, however his triglycerides are very high at 416.  He has never had particular high triglycerides in the past.  Apparently he  had eaten just 30 to 40 minutes before the lab work.  This likely explains the high triglycerides.  Also at the time he was regularly drinking a few beers every day which she says he is cut out now that he is working with his trainer.  He denies any chest pain or shortness of breath.  Blood pressure was 128/88 today.   PMHx:  Past Medical History:  Diagnosis Date   Allergy    SEASONAL   Anxiety    Arthritis    Cancer (HCC) 2003   kidney - 2003   Cataract    BILATERAL-REMOVED   Chronic kidney disease    kidney stones and (R) nephrectomy   Colon polyps    Depression    GERD (gastroesophageal reflux disease)    Hyperlipidemia    PVC's  (premature ventricular contractions)    arrhythmia    Past Surgical History:  Procedure Laterality Date   CATARACT EXTRACTION     right eye   COLONOSCOPY  2014   EYE SURGERY     JOINT REPLACEMENT     NEPHRECTOMY  2003   (R) for cancer tumor   POLYPECTOMY     RETINAL DETACHMENT SURGERY  2012   TOTAL KNEE REPLACEMENTS Bilateral     FAMHx:  Family History  Problem Relation Age of Onset   Arthritis Mother    Depression Mother    Colon cancer Mother 69       part of colon was removed   Alcohol abuse Father    Heart disease Father    Hyperlipidemia Father    Hyperlipidemia Maternal Grandmother    Hyperlipidemia Maternal Grandfather    Hyperlipidemia Paternal Grandmother    Diabetes Paternal Grandmother    Rectal cancer Paternal Grandmother    Arthritis Paternal Grandfather    Hyperlipidemia Paternal Grandfather    Stomach cancer Neg Hx    Esophageal cancer Neg Hx    Crohn's disease Neg Hx     SOCHx:   reports that he quit smoking about 22 years ago. His smoking use included cigarettes, pipe, and cigars. He quit smokeless tobacco use about 21 years ago. He reports current alcohol use of about 4.0 standard drinks of alcohol per week. He reports that he does not use drugs.  ALLERGIES:  Allergies  Allergen Reactions   Atorvastatin  Other (See Comments)    Myalgia   Sulfa Drugs Cross Reactors     Per pt: unknown    ROS: Pertinent items noted in HPI and remainder of comprehensive ROS otherwise negative.  HOME MEDS: Current Outpatient Medications on File Prior to Visit  Medication Sig Dispense Refill   amphetamine -dextroamphetamine  (ADDERALL XR) 20 MG 24 hr capsule Take 1 capsule (20 mg total) by mouth daily. 30 capsule 0   amphetamine -dextroamphetamine  (ADDERALL) 10 MG tablet Take 1 tablet (10 mg total) by mouth daily. 30 tablet 0   B Complex-C (SUPER B COMPLEX PO) Take 1 tablet by mouth daily.     buPROPion  (WELLBUTRIN  XL) 300 MG 24 hr tablet Take 1 tablet (300 mg  total) by mouth daily. 90 tablet 1   MAGNESIUM GLYCINATE PO Take 400 mg by mouth. Pt taking 60 mg of elemental magnesium at bedtime     Multiple Vitamin (ONE-A-DAY MENS PO) Take 1 tablet by mouth daily.     Omega-3 Fatty Acids (OMEGA-3 FISH OIL PO) Take 2,400 mg by mouth daily. Pt. Takes 720mg  and 600 mg of Omega-3 EPA and DHA     OVER  THE COUNTER MEDICATION NATURAL PSYLIUM HUSK FIBER 0.52 grams,(5Capsules) daily     PSYLLIUM HUSK PO Take 5 capsules by mouth in the morning.     rosuvastatin  (CRESTOR ) 20 MG tablet Take 1 tablet (20 mg total) by mouth daily. Please call office to schedule an appointment for refills. [phone]. 30 tablet 1   UNABLE TO FIND Med Name: Magnesium L-Threonate 2000 mg daily  (144 mg of elemental magnesium)     amphetamine -dextroamphetamine  (ADDERALL XR) 20 MG 24 hr capsule Take 1 capsule (20 mg total) by mouth daily. (Patient not taking: Reported on 09/28/2024) 30 capsule 0   amphetamine -dextroamphetamine  (ADDERALL XR) 20 MG 24 hr capsule Take 1 capsule (20 mg total) by mouth daily. (Patient not taking: Reported on 09/28/2024) 30 capsule 0   amphetamine -dextroamphetamine  (ADDERALL) 10 MG tablet Take 1 tablet (10 mg total) by mouth daily as needed. 30 tablet 0   amphetamine -dextroamphetamine  (ADDERALL) 10 MG tablet Take 1 tablet (10 mg total) by mouth daily. 30 tablet 0   amphetamine -dextroamphetamine  (ADDERALL) 10 MG tablet Take 1 tablet (10 mg total) by mouth daily. (Patient not taking: Reported on 09/28/2024) 30 tablet 0   amphetamine -dextroamphetamine  (ADDERALL) 10 MG tablet Take 1 tablet (10 mg total) by mouth daily. (Patient not taking: Reported on 09/28/2024) 30 tablet 0   No current facility-administered medications on file prior to visit.    LABS/IMAGING: No results found for this or any previous visit (from the past 48 hours). No results found.  LIPID PANEL:    Component Value Date/Time   CHOL 156 07/05/2024 1448   CHOL 197 04/03/2022 0855   TRIG (H)  07/05/2024 1448    416.0 Triglyceride is over 400; calculations on Lipids are invalid.   HDL 42.90 07/05/2024 1448   HDL 38 (L) 04/03/2022 0855   CHOLHDL 4 07/05/2024 1448   VLDL 83.2 (H) 07/05/2024 1448   LDLCALC 66 07/29/2023 0847   LDLCALC 140 (H) 04/03/2022 0855   LDLDIRECT 88.0 07/05/2024 1448    WEIGHTS: Wt Readings from Last 3 Encounters:  09/28/24 246 lb 12.8 oz (111.9 kg)  09/19/24 245 lb (111.1 kg)  07/05/24 258 lb 6.4 oz (117.2 kg)    VITALS: BP 128/88   Pulse 81   Ht 5' 7 (1.702 m)   Wt 246 lb 12.8 oz (111.9 kg)   SpO2 96%   BMI 38.65 kg/m   EXAM: General appearance: alert and no distress Neck: no carotid bruit, no JVD and thyroid  not enlarged, symmetric, no tenderness/mass/nodules Lungs: clear to auscultation bilaterally Heart: regular rate and rhythm Abdomen: soft, non-tender; bowel sounds normal; no masses,  no organomegaly Extremities: extremities normal, atraumatic, no cyanosis or edema Pulses: 2+ and symmetric Skin: Skin color, texture, turgor normal. No rashes or lesions Neurologic: Grossly normal Psych: Pleasant  EKG: EKG Interpretation Date/Time:  Wednesday September 28 2024 09:35:23 EDT Ventricular Rate:  81 PR Interval:  142 QRS Duration:  94 QT Interval:  372 QTC Calculation: 432 R Axis:   18  Text Interpretation: Normal sinus rhythm Normal ECG When compared with ECG of 02-May-2021 23:44, No significant change was found Confirmed by Mona Kent [phone]) on 09/28/2024 9:53:00 AM    ASSESSMENT: Atypical chest pain -low risk Myoview  (04/2021) CAC score 123, 61st percentile for age and sex matched controls (04/2022) Dilated ascending aorta to 40 mm -repeat CT scan was not dilated Strong family history of premature coronary disease Mixed dyslipidemia Asymptomatic PVCs Aortic atherosclerosis  PLAN: 1.   Alan Tyler seems to be  doing well and is asymptomatic denying any chest pain.  His cholesterol is much improved except triglycerides were  high however he was not fasting.  I would like to repeat that fasting and especially since he has cut back significantly on alcohol use.  He denies any PVCs or palpitations.  He did have a repeat scan to look at his dilated ascending aorta which measured normal and he had 2 small pulmonary nodules which are considered benign.  No further follow-up of this is necessary.  Plan follow-up with me annually or sooner as necessary.  Alan KYM Maxcy, MD, [facility], FNLA, FACP  [place]  [facility]  Medical Director of the Advanced Lipid Disorders &  Cardiovascular Risk Reduction Clinic Diplomate of the American Board of Clinical Lipidology Attending Cardiologist  Direct Dial: [phone]  Fax: [phone]  Website:  [email or web address] Alan Tyler 09/28/2024, 9:53 AM

## 2024-09-28 NOTE — Patient Instructions (Signed)
 Medication Instructions:  NO CHANGES  *If you need a refill on your cardiac medications before your next appointment, please call your pharmacy*  Lab Work: LIPID PANEL on 1st floor  If you have labs (blood work) drawn today and your tests are completely normal, you will receive your results only by: MyChart Message (if you have MyChart) OR A paper copy in the mail If you have any lab test that is abnormal or we need to change your treatment, we will call you to review the results.  Follow-Up: At [facility], you and your health needs are our priority.  As part of our continuing mission to provide you with exceptional heart care, our providers are all part of one team.  This team includes your primary Cardiologist (physician) and Advanced Practice Providers or APPs (Physician Assistants and Nurse Practitioners) who all work together to provide you with the care you need, when you need it.  Your next appointment:    12 months   We recommend signing up for the patient portal called MyChart.  Sign up information is provided on this After Visit Summary.  MyChart is used to connect with patients for Virtual Visits (Telemedicine).  Patients are able to view lab/test results, encounter notes, upcoming appointments, etc.  Non-urgent messages can be sent to your provider as well.   To learn more about what you can do with MyChart, go to [email or web address].   Other Instructions

## 2024-09-29 LAB — LIPID PANEL
Chol/HDL Ratio: 3.3 ratio (ref 0.0–5.0)
Cholesterol, Total: 122 mg/dL (ref 100–199)
HDL: 37 mg/dL — ABNORMAL LOW (ref >39)
LDL Chol Calc (NIH): 68 mg/dL (ref 0–99)
Triglycerides: 85 mg/dL (ref 0–149)
VLDL Cholesterol Cal: 17 mg/dL (ref 5–40)

## 2024-09-30 ENCOUNTER — Encounter: Payer: Self-pay | Admitting: Internal Medicine

## 2024-10-02 ENCOUNTER — Ambulatory Visit: Payer: Self-pay | Admitting: Internal Medicine

## 2024-10-03 ENCOUNTER — Other Ambulatory Visit: Payer: Self-pay | Admitting: Internal Medicine

## 2024-10-03 ENCOUNTER — Other Ambulatory Visit (HOSPITAL_BASED_OUTPATIENT_CLINIC_OR_DEPARTMENT_OTHER): Payer: Self-pay

## 2024-10-03 MED ORDER — BUPROPION HCL ER (XL) 300 MG PO TB24
300.0000 mg | ORAL_TABLET | Freq: Every day | ORAL | 1 refills | Status: AC
  Filled 2024-10-03: qty 90, 90d supply, fill #0
  Filled 2025-01-11: qty 90, 90d supply, fill #1

## 2024-10-05 ENCOUNTER — Ambulatory Visit: Admitting: Internal Medicine

## 2024-10-05 ENCOUNTER — Other Ambulatory Visit (HOSPITAL_BASED_OUTPATIENT_CLINIC_OR_DEPARTMENT_OTHER): Payer: Self-pay

## 2024-10-05 ENCOUNTER — Encounter: Payer: Self-pay | Admitting: Internal Medicine

## 2024-10-05 VITALS — BP 120/84 | HR 90 | Temp 98.3°F | Wt 247.2 lb

## 2024-10-05 DIAGNOSIS — Z23 Encounter for immunization: Secondary | ICD-10-CM

## 2024-10-05 DIAGNOSIS — F900 Attention-deficit hyperactivity disorder, predominantly inattentive type: Secondary | ICD-10-CM | POA: Diagnosis not present

## 2024-10-05 MED ORDER — AMPHETAMINE-DEXTROAMPHET ER 20 MG PO CP24
20.0000 mg | ORAL_CAPSULE | Freq: Every day | ORAL | 0 refills | Status: DC
  Filled 2024-10-05: qty 30, 30d supply, fill #0

## 2024-10-05 MED ORDER — AMPHETAMINE-DEXTROAMPHETAMINE 10 MG PO TABS
10.0000 mg | ORAL_TABLET | Freq: Every day | ORAL | 0 refills | Status: DC
  Filled 2024-10-05 – 2024-12-06 (×2): qty 30, 30d supply, fill #0

## 2024-10-05 MED ORDER — AMPHETAMINE-DEXTROAMPHET ER 20 MG PO CP24
20.0000 mg | ORAL_CAPSULE | Freq: Every day | ORAL | 0 refills | Status: DC
  Filled 2024-10-05 – 2024-11-04 (×2): qty 30, 30d supply, fill #0

## 2024-10-05 MED ORDER — AMPHETAMINE-DEXTROAMPHETAMINE 10 MG PO TABS
10.0000 mg | ORAL_TABLET | Freq: Every day | ORAL | 0 refills | Status: AC
  Filled 2024-10-05: qty 30, 30d supply, fill #0

## 2024-10-05 MED ORDER — AMPHETAMINE-DEXTROAMPHET ER 20 MG PO CP24
20.0000 mg | ORAL_CAPSULE | Freq: Every day | ORAL | 0 refills | Status: DC
  Filled 2024-10-05 – 2024-12-06 (×2): qty 30, 30d supply, fill #0

## 2024-10-05 MED ORDER — AMPHETAMINE-DEXTROAMPHETAMINE 10 MG PO TABS
10.0000 mg | ORAL_TABLET | Freq: Every day | ORAL | 0 refills | Status: AC
  Filled 2024-10-05 – 2024-11-04 (×2): qty 30, 30d supply, fill #0

## 2024-10-05 NOTE — Addendum Note (Signed)
 Addended by: KATHRYNE MILLMAN B on: 10/05/2024 10:45 AM   Modules accepted: Orders

## 2024-10-05 NOTE — Progress Notes (Signed)
 Established Patient Office Visit     CC/Reason for Visit: Medication refills  HPI: Alan Tyler is a 68 y.o. male who is coming in today for the above mentioned reasons. Past Medical History is significant for: ADHD on Adderall due for refills today.  Also requesting flu vaccination.   Past Medical/Surgical History: Past Medical History:  Diagnosis Date   Allergy    SEASONAL   Anxiety    Arthritis    Cancer (HCC) 2003   kidney - 2003   Cataract    BILATERAL-REMOVED   Chronic kidney disease    kidney stones and (R) nephrectomy   Colon polyps    Depression    GERD (gastroesophageal reflux disease)    Hyperlipidemia    PVC's (premature ventricular contractions)    arrhythmia    Past Surgical History:  Procedure Laterality Date   CATARACT EXTRACTION     right eye   COLONOSCOPY  2014   EYE SURGERY     JOINT REPLACEMENT     NEPHRECTOMY  2003   (R) for cancer tumor   POLYPECTOMY     RETINAL DETACHMENT SURGERY  2012   TOTAL KNEE REPLACEMENTS Bilateral     Social History:  reports that he quit smoking about 22 years ago. His smoking use included cigarettes, pipe, and cigars. He quit smokeless tobacco use about 21 years ago. He reports current alcohol use of about 4.0 standard drinks of alcohol per week. He reports that he does not use drugs.  Allergies: Allergies  Allergen Reactions   Atorvastatin  Other (See Comments)    Myalgia   Sulfa Drugs Cross Reactors     Per pt: unknown    Family History:  Family History  Problem Relation Age of Onset   Arthritis Mother    Depression Mother    Colon cancer Mother 25       part of colon was removed   Alcohol abuse Father    Heart disease Father    Hyperlipidemia Father    Hyperlipidemia Maternal Grandmother    Hyperlipidemia Maternal Grandfather    Hyperlipidemia Paternal Grandmother    Diabetes Paternal Grandmother    Rectal cancer Paternal Grandmother    Arthritis Paternal Grandfather    Hyperlipidemia  Paternal Grandfather    Stomach cancer Neg Hx    Esophageal cancer Neg Hx    Crohn's disease Neg Hx      Current Outpatient Medications:    amphetamine -dextroamphetamine  (ADDERALL) 10 MG tablet, Take 1 tablet (10 mg total) by mouth daily as needed., Disp: 30 tablet, Rfl: 0   amphetamine -dextroamphetamine  (ADDERALL) 10 MG tablet, Take 1 tablet (10 mg total) by mouth daily., Disp: 30 tablet, Rfl: 0   B Complex-C (SUPER B COMPLEX PO), Take 1 tablet by mouth daily., Disp: , Rfl:    buPROPion  (WELLBUTRIN  XL) 300 MG 24 hr tablet, Take 1 tablet (300 mg total) by mouth daily., Disp: 90 tablet, Rfl: 1   MAGNESIUM GLYCINATE PO, Take 400 mg by mouth. Pt taking 60 mg of elemental magnesium at bedtime, Disp: , Rfl:    Multiple Vitamin (ONE-A-DAY MENS PO), Take 1 tablet by mouth daily., Disp: , Rfl:    Omega-3 Fatty Acids (OMEGA-3 FISH OIL PO), Take 2,400 mg by mouth daily. Pt. Takes 720mg  and 600 mg of Omega-3 EPA and DHA, Disp: , Rfl:    OVER THE COUNTER MEDICATION, NATURAL PSYLIUM HUSK FIBER 0.52 grams,(5Capsules) daily, Disp: , Rfl:    PSYLLIUM HUSK PO, Take 5  capsules by mouth in the morning., Disp: , Rfl:    rosuvastatin  (CRESTOR ) 20 MG tablet, Take 1 tablet (20 mg total) by mouth daily. Please call office to schedule an appointment for refills. [phone]., Disp: 30 tablet, Rfl: 1   UNABLE TO FIND, Med Name: Magnesium L-Threonate 2000 mg daily  (144 mg of elemental magnesium), Disp: , Rfl:    amphetamine -dextroamphetamine  (ADDERALL XR) 20 MG 24 hr capsule, Take 1 capsule (20 mg total) by mouth daily., Disp: 30 capsule, Rfl: 0   amphetamine -dextroamphetamine  (ADDERALL XR) 20 MG 24 hr capsule, Take 1 capsule (20 mg total) by mouth daily., Disp: 30 capsule, Rfl: 0   amphetamine -dextroamphetamine  (ADDERALL XR) 20 MG 24 hr capsule, Take 1 capsule (20 mg total) by mouth daily., Disp: 30 capsule, Rfl: 0   amphetamine -dextroamphetamine  (ADDERALL) 10 MG tablet, Take 1 tablet (10 mg total) by mouth daily.,  Disp: 30 tablet, Rfl: 0   amphetamine -dextroamphetamine  (ADDERALL) 10 MG tablet, Take 1 tablet (10 mg total) by mouth daily., Disp: 30 tablet, Rfl: 0   amphetamine -dextroamphetamine  (ADDERALL) 10 MG tablet, Take 1 tablet (10 mg total) by mouth daily., Disp: 30 tablet, Rfl: 0  Review of Systems:  Negative unless indicated in HPI.   Physical Exam: Vitals:   10/05/24 1000  BP: 120/84  Pulse: 90  Temp: 98.3 F (36.8 C)  TempSrc: Oral  SpO2: 97%  Weight: 247 lb 3.2 oz (112.1 kg)    Body mass index is 38.72 kg/m.    Impression and Plan:  Immunization due  Attention deficit hyperactivity disorder (ADHD), predominantly inattentive type -     Amphetamine -Dextroamphet ER; Take 1 capsule (20 mg total) by mouth daily.  Dispense: 30 capsule; Refill: 0 -     Amphetamine -Dextroamphet ER; Take 1 capsule (20 mg total) by mouth daily.  Dispense: 30 capsule; Refill: 0 -     Amphetamine -Dextroamphet ER; Take 1 capsule (20 mg total) by mouth daily.  Dispense: 30 capsule; Refill: 0 -     Amphetamine -Dextroamphetamine ; Take 1 tablet (10 mg total) by mouth daily.  Dispense: 30 tablet; Refill: 0 -     Amphetamine -Dextroamphetamine ; Take 1 tablet (10 mg total) by mouth daily.  Dispense: 30 tablet; Refill: 0 -     Amphetamine -Dextroamphetamine ; Take 1 tablet (10 mg total) by mouth daily.  Dispense: 30 tablet; Refill: 0   - PDMP reviewed, no red flags, overdose rescore is 100. - Refill Adderall ER 20 mg to take 1 tablet daily for total of 30 tablets x 3 months, will also refill Adderall immediate release 10 mg to take 1 tablet in the afternoon for a total of 30 tablets daily x 3 months. - Flu vaccine given in office today.  Time spent:22 minutes reviewing chart, interviewing and examining patient and formulating plan of care.     Tully Theophilus Andrews, MD [facility]

## 2024-10-16 ENCOUNTER — Other Ambulatory Visit: Payer: Self-pay | Admitting: Internal Medicine

## 2024-10-17 ENCOUNTER — Other Ambulatory Visit (HOSPITAL_BASED_OUTPATIENT_CLINIC_OR_DEPARTMENT_OTHER): Payer: Self-pay

## 2024-10-17 ENCOUNTER — Other Ambulatory Visit: Payer: Self-pay

## 2024-10-17 MED ORDER — ROSUVASTATIN CALCIUM 20 MG PO TABS
20.0000 mg | ORAL_TABLET | Freq: Every day | ORAL | 3 refills | Status: AC
  Filled 2024-10-17: qty 90, 90d supply, fill #0
  Filled 2025-01-11: qty 90, 90d supply, fill #1

## 2024-10-18 ENCOUNTER — Other Ambulatory Visit (HOSPITAL_BASED_OUTPATIENT_CLINIC_OR_DEPARTMENT_OTHER): Payer: Self-pay

## 2024-11-04 ENCOUNTER — Other Ambulatory Visit (HOSPITAL_BASED_OUTPATIENT_CLINIC_OR_DEPARTMENT_OTHER): Payer: Self-pay

## 2024-11-23 DIAGNOSIS — I2510 Atherosclerotic heart disease of native coronary artery without angina pectoris: Secondary | ICD-10-CM | POA: Diagnosis not present

## 2024-11-23 DIAGNOSIS — Z8249 Family history of ischemic heart disease and other diseases of the circulatory system: Secondary | ICD-10-CM | POA: Diagnosis not present

## 2024-11-23 DIAGNOSIS — Z823 Family history of stroke: Secondary | ICD-10-CM | POA: Diagnosis not present

## 2024-11-23 DIAGNOSIS — E785 Hyperlipidemia, unspecified: Secondary | ICD-10-CM | POA: Diagnosis not present

## 2024-11-23 DIAGNOSIS — I10 Essential (primary) hypertension: Secondary | ICD-10-CM | POA: Diagnosis not present

## 2024-11-23 DIAGNOSIS — Z809 Family history of malignant neoplasm, unspecified: Secondary | ICD-10-CM | POA: Diagnosis not present

## 2024-11-23 DIAGNOSIS — F909 Attention-deficit hyperactivity disorder, unspecified type: Secondary | ICD-10-CM | POA: Diagnosis not present

## 2024-11-23 DIAGNOSIS — Z87891 Personal history of nicotine dependence: Secondary | ICD-10-CM | POA: Diagnosis not present

## 2024-11-23 DIAGNOSIS — F329 Major depressive disorder, single episode, unspecified: Secondary | ICD-10-CM | POA: Diagnosis not present

## 2024-12-06 ENCOUNTER — Other Ambulatory Visit: Payer: Self-pay

## 2024-12-06 ENCOUNTER — Other Ambulatory Visit (HOSPITAL_BASED_OUTPATIENT_CLINIC_OR_DEPARTMENT_OTHER): Payer: Self-pay

## 2024-12-07 ENCOUNTER — Other Ambulatory Visit (HOSPITAL_BASED_OUTPATIENT_CLINIC_OR_DEPARTMENT_OTHER): Payer: Self-pay

## 2025-01-05 ENCOUNTER — Encounter: Payer: Self-pay | Admitting: Internal Medicine

## 2025-01-05 ENCOUNTER — Ambulatory Visit: Admitting: Internal Medicine

## 2025-01-05 ENCOUNTER — Other Ambulatory Visit (HOSPITAL_BASED_OUTPATIENT_CLINIC_OR_DEPARTMENT_OTHER): Payer: Self-pay

## 2025-01-05 DIAGNOSIS — F900 Attention-deficit hyperactivity disorder, predominantly inattentive type: Secondary | ICD-10-CM | POA: Diagnosis not present

## 2025-01-05 MED ORDER — AMPHETAMINE-DEXTROAMPHETAMINE 10 MG PO TABS
10.0000 mg | ORAL_TABLET | Freq: Every day | ORAL | 0 refills | Status: AC | PRN
  Filled 2025-01-05: qty 30, 30d supply, fill #0

## 2025-01-05 MED ORDER — AMPHETAMINE-DEXTROAMPHETAMINE 10 MG PO TABS
10.0000 mg | ORAL_TABLET | Freq: Every day | ORAL | 0 refills | Status: AC
  Filled 2025-01-05 (×2): qty 30, 30d supply, fill #0

## 2025-01-05 MED ORDER — AMPHETAMINE-DEXTROAMPHET ER 20 MG PO CP24
20.0000 mg | ORAL_CAPSULE | Freq: Every day | ORAL | 0 refills | Status: AC
  Filled 2025-01-05: qty 30, 30d supply, fill #0

## 2025-01-05 MED ORDER — AMPHETAMINE-DEXTROAMPHETAMINE 10 MG PO TABS
10.0000 mg | ORAL_TABLET | Freq: Every day | ORAL | 0 refills | Status: AC
  Filled 2025-01-05: qty 30, 30d supply, fill #0

## 2025-01-05 MED ORDER — AMPHETAMINE-DEXTROAMPHET ER 20 MG PO CP24
20.0000 mg | ORAL_CAPSULE | Freq: Every day | ORAL | 0 refills | Status: AC
  Filled 2025-01-05 – 2025-01-07 (×2): qty 30, 30d supply, fill #0

## 2025-01-05 NOTE — Progress Notes (Signed)
 "    Established Patient Office Visit     CC/Reason for Visit: Medication refills  HPI: Alan Tyler is a 69 y.o. male who is coming in today for the above mentioned reasons. Past Medical History is significant for: ADHD.  Needing medication refills.   Past Medical/Surgical History: Past Medical History:  Diagnosis Date   Allergy    SEASONAL   Anxiety    Arthritis    Cancer (HCC) 2003   kidney - 2003   Cataract    BILATERAL-REMOVED   Chronic kidney disease    kidney stones and (R) nephrectomy   Colon polyps    Depression    GERD (gastroesophageal reflux disease)    Hyperlipidemia    PVC's (premature ventricular contractions)    arrhythmia    Past Surgical History:  Procedure Laterality Date   CATARACT EXTRACTION     right eye   COLONOSCOPY  2014   EYE SURGERY     JOINT REPLACEMENT     NEPHRECTOMY  2003   (R) for cancer tumor   POLYPECTOMY     RETINAL DETACHMENT SURGERY  2012   TOTAL KNEE REPLACEMENTS Bilateral     Social History:  reports that he quit smoking about 23 years ago. His smoking use included cigarettes, pipe, and cigars. He quit smokeless tobacco use about 22 years ago. He reports current alcohol use of about 4.0 standard drinks of alcohol per week. He reports that he does not use drugs.  Allergies: Allergies[1]  Family History:  Family History  Problem Relation Age of Onset   Arthritis Mother    Depression Mother    Colon cancer Mother 6       part of colon was removed   Alcohol abuse Father    Heart disease Father    Hyperlipidemia Father    Hyperlipidemia Maternal Grandmother    Hyperlipidemia Maternal Grandfather    Hyperlipidemia Paternal Grandmother    Diabetes Paternal Grandmother    Rectal cancer Paternal Grandmother    Arthritis Paternal Grandfather    Hyperlipidemia Paternal Grandfather    Stomach cancer Neg Hx    Esophageal cancer Neg Hx    Crohn's disease Neg Hx     Current Medications[2]  Review of Systems:   Negative unless indicated in HPI.   Physical Exam: Vitals:   01/05/25 1411  BP: 120/84  Pulse: 100  Temp: 97.9 F (36.6 C)  TempSrc: Oral  SpO2: 98%  Weight: 247 lb 8 oz (112.3 kg)    Body mass index is 38.76 kg/m.    Impression and Plan:  Attention deficit hyperactivity disorder (ADHD), predominantly inattentive type -     Amphetamine -Dextroamphet ER; Take 1 capsule (20 mg total) by mouth daily.  Dispense: 30 capsule; Refill: 0 -     Amphetamine -Dextroamphet ER; Take 1 capsule (20 mg total) by mouth daily.  Dispense: 30 capsule; Refill: 0 -     Amphetamine -Dextroamphet ER; Take 1 capsule (20 mg total) by mouth daily.  Dispense: 30 capsule; Refill: 0 -     Amphetamine -Dextroamphetamine ; Take 1 tablet (10 mg total) by mouth daily as needed.  Dispense: 30 tablet; Refill: 0 -     Amphetamine -Dextroamphetamine ; Take 1 tablet (10 mg total) by mouth daily.  Dispense: 30 tablet; Refill: 0 -     Amphetamine -Dextroamphetamine ; Take 1 tablet (10 mg total) by mouth daily.  Dispense: 30 tablet; Refill: 0  -PDMP reviewed, no red flags, overdose rescore is 100.  Refill Adderall XR 20 mg to  take 1 tablet daily and an additional 10 mg tablet to take in the afternoon.   Time spent:22 minutes reviewing chart, interviewing and examining patient and formulating plan of care.     Tully Theophilus Andrews, MD [facility]     [1]  Allergies Allergen Reactions   Atorvastatin  Other (See Comments)    Myalgia   Sulfa Drugs Cross Reactors     Per pt: unknown  [2]  Current Outpatient Medications:    amphetamine -dextroamphetamine  (ADDERALL) 10 MG tablet, Take 1 tablet (10 mg total) by mouth daily., Disp: 30 tablet, Rfl: 0   amphetamine -dextroamphetamine  (ADDERALL) 10 MG tablet, Take 1 tablet (10 mg total) by mouth daily., Disp: 30 tablet, Rfl: 0   B Complex-C (SUPER B COMPLEX PO), Take 1 tablet by mouth daily., Disp: , Rfl:    buPROPion  (WELLBUTRIN  XL) 300 MG 24 hr  tablet, Take 1 tablet (300 mg total) by mouth daily., Disp: 90 tablet, Rfl: 1   MAGNESIUM GLYCINATE PO, Take 400 mg by mouth. Pt taking 60 mg of elemental magnesium at bedtime, Disp: , Rfl:    Multiple Vitamin (ONE-A-DAY MENS PO), Take 1 tablet by mouth daily., Disp: , Rfl:    Omega-3 Fatty Acids (OMEGA-3 FISH OIL PO), Take 2,400 mg by mouth daily. Pt. Takes 720mg  and 600 mg of Omega-3 EPA and DHA, Disp: , Rfl:    OVER THE COUNTER MEDICATION, NATURAL PSYLIUM HUSK FIBER 0.52 grams,(5Capsules) daily, Disp: , Rfl:    PSYLLIUM HUSK PO, Take 5 capsules by mouth in the morning., Disp: , Rfl:    rosuvastatin  (CRESTOR ) 20 MG tablet, Take 1 tablet (20 mg total) by mouth daily. Please call office to schedule an appointment for refills. [phone]., Disp: 90 tablet, Rfl: 3   UNABLE TO FIND, Med Name: Magnesium L-Threonate 2000 mg daily  (144 mg of elemental magnesium), Disp: , Rfl:    amphetamine -dextroamphetamine  (ADDERALL XR) 20 MG 24 hr capsule, Take 1 capsule (20 mg total) by mouth daily., Disp: 30 capsule, Rfl: 0   amphetamine -dextroamphetamine  (ADDERALL XR) 20 MG 24 hr capsule, Take 1 capsule (20 mg total) by mouth daily., Disp: 30 capsule, Rfl: 0   amphetamine -dextroamphetamine  (ADDERALL XR) 20 MG 24 hr capsule, Take 1 capsule (20 mg total) by mouth daily., Disp: 30 capsule, Rfl: 0   amphetamine -dextroamphetamine  (ADDERALL) 10 MG tablet, Take 1 tablet (10 mg total) by mouth daily as needed., Disp: 30 tablet, Rfl: 0   amphetamine -dextroamphetamine  (ADDERALL) 10 MG tablet, Take 1 tablet (10 mg total) by mouth daily., Disp: 30 tablet, Rfl: 0   amphetamine -dextroamphetamine  (ADDERALL) 10 MG tablet, Take 1 tablet (10 mg total) by mouth daily., Disp: 30 tablet, Rfl: 0  "

## 2025-01-07 ENCOUNTER — Other Ambulatory Visit (HOSPITAL_BASED_OUTPATIENT_CLINIC_OR_DEPARTMENT_OTHER): Payer: Self-pay

## 2025-07-05 ENCOUNTER — Encounter: Admitting: Internal Medicine
